# Patient Record
Sex: Female | Born: 1950 | Race: White | Hispanic: No | Smoking: Never smoker
Health system: Southern US, Community
[De-identification: ages and names within clinical notes are randomized; demographics above are authoritative.]

## PROBLEM LIST (undated history)

## (undated) DIAGNOSIS — R51 Headache: Secondary | ICD-10-CM

## (undated) DIAGNOSIS — F32A Depression, unspecified: Secondary | ICD-10-CM

## (undated) DIAGNOSIS — Z8719 Personal history of other diseases of the digestive system: Secondary | ICD-10-CM

## (undated) DIAGNOSIS — H269 Unspecified cataract: Secondary | ICD-10-CM

## (undated) DIAGNOSIS — J189 Pneumonia, unspecified organism: Secondary | ICD-10-CM

## (undated) DIAGNOSIS — D510 Vitamin B12 deficiency anemia due to intrinsic factor deficiency: Secondary | ICD-10-CM

## (undated) DIAGNOSIS — E785 Hyperlipidemia, unspecified: Secondary | ICD-10-CM

## (undated) DIAGNOSIS — F419 Anxiety disorder, unspecified: Secondary | ICD-10-CM

## (undated) DIAGNOSIS — L509 Urticaria, unspecified: Secondary | ICD-10-CM

## (undated) DIAGNOSIS — T7840XA Allergy, unspecified, initial encounter: Secondary | ICD-10-CM

## (undated) DIAGNOSIS — K589 Irritable bowel syndrome without diarrhea: Secondary | ICD-10-CM

## (undated) DIAGNOSIS — G43909 Migraine, unspecified, not intractable, without status migrainosus: Secondary | ICD-10-CM

## (undated) DIAGNOSIS — F329 Major depressive disorder, single episode, unspecified: Secondary | ICD-10-CM

## (undated) DIAGNOSIS — Z91018 Allergy to other foods: Secondary | ICD-10-CM

## (undated) DIAGNOSIS — R519 Headache, unspecified: Secondary | ICD-10-CM

## (undated) DIAGNOSIS — E079 Disorder of thyroid, unspecified: Secondary | ICD-10-CM

## (undated) DIAGNOSIS — K219 Gastro-esophageal reflux disease without esophagitis: Secondary | ICD-10-CM

## (undated) DIAGNOSIS — M1990 Unspecified osteoarthritis, unspecified site: Secondary | ICD-10-CM

## (undated) HISTORY — DX: Allergy, unspecified, initial encounter: T78.40XA

## (undated) HISTORY — DX: Anxiety disorder, unspecified: F41.9

## (undated) HISTORY — DX: Unspecified cataract: H26.9

## (undated) HISTORY — PX: EYE SURGERY: SHX253

## (undated) HISTORY — DX: Urticaria, unspecified: L50.9

## (undated) HISTORY — PX: CARDIAC CATHETERIZATION: SHX172

## (undated) HISTORY — DX: Disorder of thyroid, unspecified: E07.9

## (undated) HISTORY — DX: Allergy to other foods: Z91.018

## (undated) HISTORY — PX: CHOLECYSTECTOMY OPEN: SUR202

## (undated) HISTORY — PX: CATARACT EXTRACTION W/ INTRAOCULAR LENS  IMPLANT, BILATERAL: SHX1307

## (undated) HISTORY — PX: TUBAL LIGATION: SHX77

## (undated) HISTORY — DX: Unspecified osteoarthritis, unspecified site: M19.90

---

## 1979-06-15 DIAGNOSIS — J189 Pneumonia, unspecified organism: Secondary | ICD-10-CM

## 1979-06-15 HISTORY — DX: Pneumonia, unspecified organism: J18.9

## 1983-10-15 HISTORY — PX: ABDOMINAL HYSTERECTOMY: SHX81

## 1999-01-24 ENCOUNTER — Encounter: Payer: Self-pay | Admitting: Family Medicine

## 1999-01-24 ENCOUNTER — Ambulatory Visit (HOSPITAL_COMMUNITY): Admission: RE | Admit: 1999-01-24 | Discharge: 1999-01-24 | Payer: Self-pay | Admitting: *Deleted

## 2001-05-11 ENCOUNTER — Emergency Department (HOSPITAL_COMMUNITY): Admission: EM | Admit: 2001-05-11 | Discharge: 2001-05-12 | Payer: Self-pay

## 2001-06-04 ENCOUNTER — Ambulatory Visit (HOSPITAL_COMMUNITY): Admission: RE | Admit: 2001-06-04 | Discharge: 2001-06-04 | Payer: Self-pay | Admitting: Gastroenterology

## 2003-10-15 DIAGNOSIS — K219 Gastro-esophageal reflux disease without esophagitis: Secondary | ICD-10-CM

## 2003-10-15 HISTORY — DX: Gastro-esophageal reflux disease without esophagitis: K21.9

## 2004-10-11 ENCOUNTER — Ambulatory Visit: Payer: Self-pay | Admitting: Cardiovascular Disease

## 2004-10-11 ENCOUNTER — Inpatient Hospital Stay (HOSPITAL_COMMUNITY): Admission: EM | Admit: 2004-10-11 | Discharge: 2004-10-12 | Payer: Self-pay | Admitting: Emergency Medicine

## 2004-10-26 ENCOUNTER — Emergency Department (HOSPITAL_COMMUNITY): Admission: EM | Admit: 2004-10-26 | Discharge: 2004-10-26 | Payer: Self-pay | Admitting: Emergency Medicine

## 2006-07-14 ENCOUNTER — Ambulatory Visit: Payer: Self-pay | Admitting: Family Medicine

## 2007-01-20 ENCOUNTER — Ambulatory Visit: Payer: Self-pay | Admitting: Family Medicine

## 2007-02-09 ENCOUNTER — Ambulatory Visit: Payer: Self-pay | Admitting: Family Medicine

## 2007-02-12 ENCOUNTER — Ambulatory Visit: Payer: Self-pay | Admitting: Cardiology

## 2007-03-02 ENCOUNTER — Observation Stay (HOSPITAL_COMMUNITY): Admission: EM | Admit: 2007-03-02 | Discharge: 2007-03-04 | Payer: Self-pay | Admitting: Emergency Medicine

## 2007-03-02 ENCOUNTER — Ambulatory Visit: Payer: Self-pay | Admitting: Cardiology

## 2007-03-10 ENCOUNTER — Ambulatory Visit: Payer: Self-pay | Admitting: Family Medicine

## 2007-03-20 ENCOUNTER — Ambulatory Visit: Payer: Self-pay | Admitting: Gastroenterology

## 2008-01-19 ENCOUNTER — Ambulatory Visit: Payer: Self-pay | Admitting: Cardiology

## 2008-02-29 DIAGNOSIS — M129 Arthropathy, unspecified: Secondary | ICD-10-CM | POA: Insufficient documentation

## 2008-02-29 DIAGNOSIS — D649 Anemia, unspecified: Secondary | ICD-10-CM | POA: Insufficient documentation

## 2008-02-29 DIAGNOSIS — F329 Major depressive disorder, single episode, unspecified: Secondary | ICD-10-CM

## 2008-02-29 DIAGNOSIS — E785 Hyperlipidemia, unspecified: Secondary | ICD-10-CM | POA: Insufficient documentation

## 2008-02-29 DIAGNOSIS — F32A Depression, unspecified: Secondary | ICD-10-CM | POA: Insufficient documentation

## 2010-11-23 ENCOUNTER — Encounter (INDEPENDENT_AMBULATORY_CARE_PROVIDER_SITE_OTHER): Payer: Self-pay | Admitting: *Deleted

## 2010-11-29 NOTE — Letter (Signed)
Summary: Pre Visit Letter Revised  [facility]  [address] [place], [place] [address]   Phone: [phone]  Fax: [phone]        11/23/2010 MRN: [id number] Jasmin Butler [address] [place], [place]  [address]             Procedure Date:  01-09-11   Welcome to the Gastroenterology Division at [facility].    You are scheduled to see a nurse for your pre-procedure visit on 12-26-10 at 8:00A.M. on the 3rd floor at [facility], [address].  We ask that you try to arrive at our office 15 minutes prior to your appointment time to allow for check-in.  Please take a minute to review the attached form.  If you answer "Yes" to one or more of the questions on the first page, we ask that you call the person listed at your earliest opportunity.  If you answer "No" to all of the questions, please complete the rest of the form and bring it to your appointment.    Your nurse visit will consist of discussing your medical and surgical history, your immediate family medical history, and your medications.   If you are unable to list all of your medications on the form, please bring the medication bottles to your appointment and we will list them.  We will need to be aware of both prescribed and over the counter drugs.  We will need to know exact dosage information as well.    Please be prepared to read and sign documents such as consent forms, a financial agreement, and acknowledgement forms.  If necessary, and with your consent, a friend or relative is welcome to sit-in on the nurse visit with you.  Please bring your insurance card so that we may make a copy of it.  If your insurance requires a referral to see a specialist, please bring your referral form from your primary care physician.  No co-pay is required for this nurse visit.     If you cannot keep your appointment, please call [phone] to cancel or reschedule prior to your appointment date.  This allows [place]  the opportunity to schedule an appointment for another patient in need of care.    Thank you for choosing [facility] for your medical needs.  We appreciate the opportunity to care for you.  Please visit [place] at our website  to learn more about our practice.  Sincerely, The Gastroenterology Division

## 2010-11-29 NOTE — Letter (Signed)
Summary: Pre Visit Letter Revised  [facility]  [address] [place], [place] [address]   Phone: [phone]  Fax: [phone]        11/23/2010 MRN: [id number] Jasmin Butler [address] [place], [place]  [address]             Procedure Date:  12-25-10   Welcome to the Gastroenterology Division at [facility].    You are scheduled to see a nurse for your pre-procedure visit on 12-05-10 at 1:00P.M. on the 3rd floor at [facility], [address].  We ask that you try to arrive at our office 15 minutes prior to your appointment time to allow for check-in.  Please take a minute to review the attached form.  If you answer "Yes" to one or more of the questions on the first page, we ask that you call the person listed at your earliest opportunity.  If you answer "No" to all of the questions, please complete the rest of the form and bring it to your appointment.    Your nurse visit will consist of discussing your medical and surgical history, your immediate family medical history, and your medications.   If you are unable to list all of your medications on the form, please bring the medication bottles to your appointment and we will list them.  We will need to be aware of both prescribed and over the counter drugs.  We will need to know exact dosage information as well.    Please be prepared to read and sign documents such as consent forms, a financial agreement, and acknowledgement forms.  If necessary, and with your consent, a friend or relative is welcome to sit-in on the nurse visit with you.  Please bring your insurance card so that we may make a copy of it.  If your insurance requires a referral to see a specialist, please bring your referral form from your primary care physician.  No co-pay is required for this nurse visit.     If you cannot keep your appointment, please call [phone] to cancel or reschedule prior to your appointment date.  This allows [place]  the opportunity to schedule an appointment for another patient in need of care.    Thank you for choosing [facility] for your medical needs.  We appreciate the opportunity to care for you.  Please visit [place] at our website  to learn more about our practice.  Sincerely, The Gastroenterology Division

## 2010-12-04 ENCOUNTER — Encounter (INDEPENDENT_AMBULATORY_CARE_PROVIDER_SITE_OTHER): Payer: Self-pay | Admitting: *Deleted

## 2010-12-05 ENCOUNTER — Encounter: Payer: Self-pay | Admitting: Gastroenterology

## 2010-12-11 NOTE — Miscellaneous (Signed)
Summary: [facility] Previsit/prep  Clinical Lists Changes  Medications: Added new medication of MOVIPREP 100 GM  SOLR (PEG-KCL-NACL-NASULF-NA ASC-C) As per prep instructions. - Signed Rx of MOVIPREP 100 GM  SOLR (PEG-KCL-NACL-NASULF-NA ASC-C) As per prep instructions.;  #1 x 0;  Signed;  Entered by: Wyona Almas RN;  Authorized by: Rachael Fee MD;  Method used: Print then Give to Patient Allergies: Added new allergy or adverse reaction of PENICILLIN Observations: Added new observation of NKA: F (12/05/2010 13:16)    Prescriptions: MOVIPREP 100 GM  SOLR (PEG-KCL-NACL-NASULF-NA ASC-C) As per prep instructions.  #1 x 0   Entered by:   Wyona Almas RN   Authorized by:   Rachael Fee MD   Signed by:   Wyona Almas RN on 12/05/2010   Method used:   Print then Give to Patient   RxID:   [id number]

## 2010-12-11 NOTE — Letter (Signed)
Summary: [facility] Instructions  [facility]  [address] [place], [place] [address]   Phone: [phone]  Fax: [phone]       Jasmin Butler    11-23-1950    MRN: [id number]        Procedure Day Dorna Bloom:  [facility]  01/09/11     Arrival Time:  7:30AM     Procedure Time:  8:30AM     Location of Procedure:                    _ X_   [facility] Center (4th Floor)                      PREPARATION FOR COLONOSCOPY WITH MOVIPREP   Starting 5 days prior to your procedure 01/04/11 do not eat nuts, seeds, popcorn, corn, beans, peas,  salads, or any raw vegetables.  Do not take any fiber supplements (e.g. Metamucil, Citrucel, and Benefiber).  THE DAY BEFORE YOUR PROCEDURE         DATE: 01/08/11  DAY: TUESDAY  1.  Drink clear liquids the entire day-NO SOLID FOOD  2.  Do not drink anything colored red or purple.  Avoid juices with pulp.  No orange juice.  3.  Drink at least 64 oz. (8 glasses) of fluid/clear liquids during the day to prevent dehydration and help the prep work efficiently.  CLEAR LIQUIDS INCLUDE: Water Jello Ice Popsicles Tea (sugar ok, no milk/cream) Powdered fruit flavored drinks Coffee (sugar ok, no milk/cream) Gatorade Juice: apple, white grape, white cranberry  Lemonade Clear bullion, consomm, broth Carbonated beverages (any kind) Strained chicken noodle soup Hard Candy                             4.  In the morning, mix first dose of MoviPrep solution:    Empty 1 Pouch A and 1 Pouch B into the disposable container    Add lukewarm drinking water to the top line of the container. Mix to dissolve    Refrigerate (mixed solution should be used within 24 hrs)  5.  Begin drinking the prep at 5:00 p.m. The MoviPrep container is divided by 4 marks.   Every 15 minutes drink the solution down to the next mark (approximately 8 oz) until the full liter is complete.   6.  Follow completed prep with 16 oz of clear liquid of your choice (Nothing  red or purple).  Continue to drink clear liquids until bedtime.  7.  Before going to bed, mix second dose of MoviPrep solution:    Empty 1 Pouch A and 1 Pouch B into the disposable container    Add lukewarm drinking water to the top line of the container. Mix to dissolve    Refrigerate  THE DAY OF YOUR PROCEDURE      DATE: 01/09/11   DAY: WEDNESDAY  Beginning at 3:30AM (5 hours before procedure):         1. Every 15 minutes, drink the solution down to the next mark (approx 8 oz) until the full liter is complete.  2. Follow completed prep with 16 oz. of clear liquid of your choice.    3. You may drink clear liquids until 6:30AM (2 HOURS BEFORE PROCEDURE).   MEDICATION INSTRUCTIONS  Unless otherwise instructed, you should take regular prescription medications with a small sip of water   as early as possible the morning of  your procedure.         OTHER INSTRUCTIONS  You will need a responsible adult at least 62 years of age to accompany you and drive you home.   This person must remain in the waiting room during your procedure.  Wear loose fitting clothing that is easily removed.  Leave jewelry and other valuables at home.  However, you may wish to bring a book to read or  an iPod/MP3 player to listen to music as you wait for your procedure to start.  Remove all body piercing jewelry and leave at home.  Total time from sign-in until discharge is approximately 2-3 hours.  You should go home directly after your procedure and rest.  You can resume normal activities the  day after your procedure.  The day of your procedure you should not:   Drive   Make legal decisions   Operate machinery   Drink alcohol   Return to work  You will receive specific instructions about eating, activities and medications before you leave.    The above instructions have been reviewed and explained to me by   Wyona Almas RN  December 05, 2010 1:37 PM     I fully understand  and can verbalize these instructions _____________________________ Date _________

## 2010-12-25 ENCOUNTER — Other Ambulatory Visit: Payer: Self-pay | Admitting: Gastroenterology

## 2011-01-09 ENCOUNTER — Other Ambulatory Visit: Payer: Self-pay | Admitting: Gastroenterology

## 2011-02-26 NOTE — Discharge Summary (Signed)
Jasmin Butler, Jasmin Butler                  ACCOUNT NO.:  [id number]   MEDICAL RECORD NO.:  [id number]          PATIENT TYPE:  INP   LOCATION:  2012                         FACILITY:  [facility]   PHYSICIAN:  Jesse Sans. Wall, MD, FACCDATE OF BIRTH:  September 14, 1951   DATE OF ADMISSION:  03/02/2007  DATE OF DISCHARGE:  03/04/2007                               DISCHARGE SUMMARY   PROCEDURES:  1. Left heart catheterization.  2. Coronary arteriogram.  3. Left ventriculogram.  4. Star closure device of the right common femoral artery.   PRIMARY DIAGNOSIS:  Chest pain, no coronary artery disease at  catheterization.  D-dimer negative for pulmonary embolism.   SECONDARY DIAGNOSES:  1. Pernicious anemia  2. Hyperlipidemia with a total cholesterol of 270, triglycerides 166,      high-density lipoprotein 42, low-density lipoprotein 195.  3. Status post hysterectomy, cholecystectomy, benign breast      lumpectomy, and benign cervical lymph node removal.  4. Family history of .cardiovascular in her mother.   TIME OF DISCHARGE:  37 minutes.   HOSPITAL COURSE:  The patient is a 60 year old female with no previous  history of coronary artery disease.  She had chest pain in December 2005  and a cath was clean.  She was seen by Dr. Dietrich Pates for recurrent chest  pain and was tried proton pump inhibitor, but her symptoms did not  improve.  On the day of admission she had a prolonged episode of chest  pain lasting over 2 hours and she called our office who told her to come  to the emergency room.  She was admitted for further evaluation.   Her cardiac enzymes were negative for MI.  A Helicobacter antibody test  was negative.  Her troponins were minimally elevated at 0.07.  Because  of ongoing pain and slightly elevated troponin, it was decided that  cardiac catheterization was the best test.  This was performed on  03/03/2007.   The cardiac catheterization showed normal coronaries and normal left  ventricle  with no regional wall motion abnormalities and no AS or MR.   On 03/04/2007, the cath site was stable.  There is concern for a  gastroesophageal reflux disease component to her chest pain so she is  started on a b.i.d. proton pump inhibitor.  She is encouraged to take  Tylenol for pain and follow up with Dr. Lysbeth Galas.  Because her D-dimer was  negative and her cath was clean, there was no further inpatient cardiac  workup indicated.  The patient is considered stable for discharge on  03/04/2007.   DISCHARGE INSTRUCTIONS:  Her activity level is to be increased  gradually.  She can return to work next week.  She is to stick to a low  sodium, heart healthy diet.  She is to call our office for any problems  with the cath site.  She is to follow up with Dr. Dietrich Pates as needed.  She is to follow up with Dr. Lysbeth Galas and call for appointment.   DISCHARGE MEDICATIONS:  1. Nexium 40 mg b.i.d.  2. Tylenol 500 mg 1  or 2 tablets up to e times a day p.r.n.  3. B12 injections every month.  4. BuSpar 5 mg daily p.r.n.  5. CholestOff daily as prior to admission.      Theodore Demark, PA-C      Jesse Sans. Daleen Squibb, MD, [facility]  Electronically Signed    RB/MEDQ  D:  03/04/2007  T:  03/04/2007  Job:  [id number]   cc:   Delaney Meigs, M.D.

## 2011-02-26 NOTE — H&P (Signed)
Jasmin Butler, Jasmin Butler NO.:  [id number]   MEDICAL RECORD NO.:  [id number]          PATIENT TYPE:  INP   LOCATION:  2012                         FACILITY:  [facility]   PHYSICIAN:  Jasmin Sidle, MD DATE OF BIRTH:  Apr 16, 1951   DATE OF ADMISSION:  03/02/2007  DATE OF DISCHARGE:                              HISTORY & PHYSICAL   PRIMARY CARE PHYSICIAN:  Jasmin Butler, M.D.   PRIMARY CARDIOLOGIST:  Jasmin Friends. Dietrich Pates, MD, [facility]   REASON FOR ADMISSION:  Recurrent chest pain.   HISTORY OF PRESENT ILLNESS:  Jasmin Butler is a 60 year old woman with a  history of pernicious anemia and hyperlipidemia.  She has no other major  known cardiac risk factors.  In fact, she had a reassuring cardiac  catheterization back in December 2005 in the setting of chest pain.  She  was recently seen by Dr. Dietrich Butler on May 1 due to recurrent episodes of  chest pain.  It was felt these were fairly atypical in description, and  a trial was given of proton pump inhibitor therapy with plan to followup  to discuss her progress.   Jasmin Butler relates to me the history of a sudden family stress  approximately 2 months ago following which she has had recurrent  episodes of fairly focal left lower anterior chest pain described as a  sharp burning  that can occur, lasting only a few seconds at a time,  but usually in rapid succession.  She seems to relate this mostly to  activity when she is walking around and states that she rarely ever has  these symptoms when she is still or at nighttime.  She also states that  she has been more breathless with activity and generally more fatigued.  It sounds as if she was treated with an anti-inflammatory regimen  initially without improvement in symptoms and more recently a proton  pump inhibitor, again without improvement in symptoms. Today she states  she had a much more prolonged episode lasting approximately 2 hours and  called our office in [place],  being instructed to come in for further  evaluation.   Her electrocardiogram shows sinus rhythm and is interpreted as being  possibly affected by arm lead reversal given a right superior axis  deviation.  There is a repeat EKG with similar changes, however, and  reportedly without any obvious lead reversal.  The previous tracing from  January 2006 showed normal sinus rhythm.  Present tracing shows a single  premature atrial complex, otherwise nonspecific ST changes.  She is not  tachycardic or hypoxic at this time.  She has had no syncope.   ALLERGIES:  PENICILLIN.   PRESENT MEDICATIONS:  Include B12 injections monthly.   PAST MEDICAL HISTORY:  1. Previous hysterectomy.  2. Cholecystectomy.  3. Pernicious anemia.  4. Benign breast lumpectomy.  5. Benign cervical lymph node removal.  6. Reportedly hyperlipidemia.   FAMILY HISTORY:  Significant for stroke in Jasmin Butler at age 35.  The Jasmin Butler had a history of leukemia.  She has three children  in good  health.   SOCIAL HISTORY:  The patient is married.  She works at [employer].  Drinks alcohol occasionally.  Denies any tobacco or other illicit  substance use.   REVIEW OF SYSTEMS:  As in History of Present Illness.  She has had no  obvious cough, hemoptysis, melena, hematochezia, abdominal pain,  appetite change, lower extremity edema, orthopnea, or PND.   PHYSICAL EXAMINATION:  VITAL SIGNS:  The patient is afebrile.  Heart  rate in the 80s in sinus rhythm.  Blood pressure 120/69, oxygen  saturation 96% on room air, respirations 16 and unlabored.  GENERAL:  This is a normally nourished appearing woman in no acute  distress.  HEENT:  Conjunctivae normal.  Oropharynx clear.  NECK: Supple.  No elevated jugular venous pressure, without bruits.  No  thyromegaly noted.  LUNGS:  Clear.  No labored breathing at rest.  CARDIAC:  Regular rate and rhythm without loud murmur or gallop.  ABDOMEN:  Soft, nontender.   Normoactive bowel sounds.  EXTREMITIES:  Exhibit no significant pitting edema.  Distal pulses are  2+.  MUSCULOSKELETAL:  Kyphosis is noted.  NEUROPSYCHIATRIC:  The patient is alert and oriented x3.  Affect is  normal.   LABORATORY DATA:  WBC 7.4, hemoglobin 12.6, hematocrit 37.0, platelets  260.  INR 0.9.  Sodium 140, potassium 4.0, chloride 103, bicarb 31,  glucose 99, BUN 9, creatinine 0.6.  Lipase 29.  Magnesium 2.2.  Alkaline  phosphatase 62.  CK 101, CK-MB 1.0, troponin I 0.07.   Chest x-ray from May 19 demonstrates mild linear left lower lobe  atelectasis with bronchitic changes.   IMPRESSION:  1. Progressive yet fairly atypical chest pain as outlined above.  Also      associated with this is a feeling of relative increased      breathlessness with activity and fatigue.  The Jasmin      electrocardiogram is abnormal which may be affected by incorrect      lead placement, although this is not entirely clear.  Troponin I      level is 0.07 initially with otherwise normal total CK and CK-MB      levels.  2. Previously documented normal coronary arteries at cardiac      catheterization in December 2005 with overall normal left      ventricular systolic function.  Of note, the patient also had a      normal D-dimer level at that time.  3. Possible history of hyperlipidemia.  4. History of pernicious anemia.   PLAN:  I discussed the situation with the patient and her family today.  Symptoms are atypical yet have been progressive despite management  initially with anti-inflammatory drugs and most recently proton pump  inhibitor therapy.  She does relate her symptoms to an episode of  significant stress approximately 2 months ago, and one would wonder  about the possibility of vasospasm or perhaps even Takotsubo  cardiomyopathy, although there is no clear evidence of that at this  point.  We discussed the situation today, and I do think that further testing is indicated.  We  will plan to cycle cardiac markers to look for  any particular trend in troponin I levels.  It may help to guide  additional ischemic testing.  She will be placed on aspirin and Lovenox  for the time being, and we will otherwise  plan to continue proton pump inhibitor therapy.  A followup  electrocardiogram will be obtained in the morning, and we  will also  order an echocardiogram.  We will also order a D-dimer level.  Further  plans to follow pending clinical course and initial followup testing.      Jasmin Sidle, MD  Electronically Signed     SGM/MEDQ  D:  03/02/2007  T:  03/02/2007  Job:  [id number]   cc:   Jasmin Butler, M.D.  Jasmin Friends. Dietrich Pates, MD, [facility]

## 2011-02-26 NOTE — Assessment & Plan Note (Signed)
[facility]                         GASTROENTEROLOGY OFFICE NOTE   NAME:Jasmin Butler, Jasmin Butler                       MRN:          [id number]  DATE:03/20/2007                            DOB:          1951/09/27    REASON FOR REFERRAL:  Dr. Lysbeth Galas asked me to evaluate [name] in  consultation regarding atypical chest pain.   HISTORY OF PRESENT ILLNESS:  Jasmin Butler is as very pleasant 60 year old  woman who has had three months of pain in her left chest.  She describes  the pain as intermittent, stabbing, burning-type pain located in one  specific point of her chest.  She can point just left of her sternum  approximately midway through her chest.  There is about a quarter-sized  area that is painful.  She has had coronary angiogram, was essentially  normal.  She has tried Pepcid for several weeks.  She has Nexium twice  daily.  Neither of these antacid regimens have helped.  The pain is not  brought on by eating.  It is actually improved when she lays down.  The  pain is reproducible when pushing on a specific point in her left chest.  She has no nausea or vomiting.  She has no more classic GERD symptoms of  pyrosis, acid regurgitation.  She has no dysphagia.  She has had no  nausea or vomiting.   REVIEW OF SYSTEMS:  Normal for stable weight.  Mild chronic  constipation, otherwise essentially normal and available on our intake  sheet.   PAST MEDICAL HISTORY:  1. Elevated cholesterol.  2. Arthritis.  3. Pernicious anemia.  4. Cholecystectomy 15 years ago.  5. Hysterectomy.  6. Tubal ligation.  7. Breast surgery.  8. Depression.   CURRENT MEDICATIONS:  1. Nexium 40 mg twice daily.  2. B12 injections monthly.   ALLERGIES:  No known drug allergies.   SOCIAL HISTORY:  Married with four children.  Works as a Designer, jewellery.  Nonsmoker, nondrinker.   FAMILY HISTORY:  No colon cancer or colon polyps in the family.   PHYSICAL EXAMINATION:  VITAL  SIGNS:  Height 5 feet 3 inches, 148 pounds.  Blood pressure 106/62, pulse 84.  CONSTITUTION:  Generally well-appearing.  NEUROLOGIC:  Alert and oriented x3.  EYES:  Extraocular movements are intact.  MOUTH:  Oropharynx moist.  No lesions.  NECK:  Supple.  No lymphadenopathy.  CARDIOVASCULAR:  Heart has a regular rate and rhythm.  CHEST:  A focal point of tenderness in her left chest that is  reproducible.  LUNGS:  Clear to auscultation bilaterally.  ABDOMEN:  Soft, nontender, nondistended.  Normal bowel sounds.  EXTREMITIES:  No lower extremity edema.  SKIN:  No rash or lesions on visible extremities.   ASSESSMENT/PLAN:  A 60 year old woman with atypical chest pain, unlikely  related to gastrointestinal tract.  This pain is focal pain, just left  of her sternum.  The pain is reproducible with palpation.  It seems much  more likely this is costochondritis.  She has no classic  gastroesophageal reflux disease symptoms.  She also says the pain  has  not been improved after a month of twice daily Nexium and also Pepcid  trial.  The pain is better when lying down, usually gastroesophageal  reflux disease symptoms are worse when lying down.  I think it is highly  unlikely this is related to her gastrointestinal tract.  It seems more  plausible this is costochondritis.  Given the lack of other upper  gastrointestinal symptoms, I do not think we need to proceed with EGD at  this point.  I think the working diagnosis of costochondritis is  reasonable.  I recommended she stop taking proton pump inhibitors, as  they have not made any difference.     Rachael Fee, MD  Electronically Signed    DPJ/MedQ  DD: 03/20/2007  DT: 03/20/2007  Job #: [id number]   cc:   Delaney Meigs, M.D.

## 2011-02-26 NOTE — Cardiovascular Report (Signed)
NAMELUKA, REISCH NO.:  [id number]   MEDICAL RECORD NO.:  [id number]          PATIENT TYPE:  INP   LOCATION:  2012                         FACILITY:  [facility]   PHYSICIAN:  Salvadore Farber, MD  DATE OF BIRTH:  06-Feb-1951   DATE OF PROCEDURE:  03/03/2007  DATE OF DISCHARGE:                            CARDIAC CATHETERIZATION   PROCEDURE:  Left heart catheterization, left ventriculography, coronary  angiography, StarClose closure of the right common femoral arteriotomy  site.   INDICATIONS:  Ms. Wojdyla is a 60 year old woman with hypercholesterolemia  who had a normal cardiac catheterization in 2005.  She was seen on May 1  by Dr. Dietrich Pates in the office for episodes of chest pain.  A trial of  proton pump inhibitor was undertaken.  She has had no improvement in her  symptoms with this or a trial of nonsteroidal anti-inflammatories.  She  had a prolonged episode yesterday prompting admission to the hospital.  She ruled out for myocardial infarction by serial enzymes,  electrocardiograms.  Dr. Diona Browner requested cardiac catheterization for  definitive exclusion of myocardial ischemia as a contributor to her  chest pain syndrome.  She arrives in the lab with ongoing 4/10  substernal chest pain with normal electrocardiogram and enzymes.   PROCEDURAL TECHNIQUE:  Informed consent was obtained.  Under 1%  lidocaine local anesthesia, a 5-French sheath was placed in the right  common femoral artery using the modified Seldinger technique.  Diagnostic angiography and ventriculography were performed using JL4,  JR4, and pigtail catheters.  The arteriotomy was then closed using a  StarClose device.  Complete hemostasis was obtained.  The patient was  then transferred to the holding room in stable condition having  tolerated the procedure well.   COMPLICATIONS:  None.   FINDINGS:  1. LV:  102/0/4.  EF 65% without regional wall motion abnormality.  2. No aortic stenosis  or mitral regurgitation.  3. Left main:  Angiographically normal.  4. LAD:  Moderate-sized vessel giving rise to a single diagonal.  It      is angiographically normal.  5. Circumflex:  Large, dominant vessel giving rise to a large obtuse      marginal, posterior left ventricular branch and the PDA.  It is      angiographically normal.  6. RCA:  Small nondominant vessel.  It is angiographically normal.   IMPRESSION/PLAN:  The patient has angiographically normal coronary  arteries with normal left ventricular size and systolic function.  There  is no aortic stenosis.  The etiology of her chest pain thus remains  unclear.  Symptoms  would be extremely atypical for aortic dissection.  There has been no  response to proton pump inhibitor or nonsteroidal.  The patient's has  voiced concern that her symptoms may be due to stress.  We will continue  to observe her.      Salvadore Farber, MD  Electronically Signed     WED/MEDQ  D:  03/03/2007  T:  03/03/2007  Job:  [id number]   cc:   Delaney Meigs, M.D.  Molly Maduro  Rosebud Poles, MD, [facility]

## 2011-02-26 NOTE — Letter (Signed)
January 19, 2008     RE:  Jasmin Butler, Jasmin Butler  MRN:  [id number]  /  DOB:  1951-10-08   REFERRING PHYSICIAN:  Dr. Joette Catching.   Dear Marolyn Haller:   Jasmin Butler returns to the office somewhat belatedly for further  assessment of chest discomfort.  I first saw her in May of 2008 and  treated her empirically with Prevacid.  She subsequently had a recurrent  episode, was seen in the emergency department and transferred to [facility], where she underwent a second normal cardiac  catheterization.  She subsequently was seen by one of the [place]  gastroenterologists, who did not feel that her symptoms were consistent  with GI disease.  She has had persistent localized left sternal border  pain since that time.  This is present virtually constantly.  It is  relieved by direct pressure.  There is no radiation.  There are no  associated symptoms.  She feels that it is significantly impairing her  quality of life.  Nonetheless, she did not consult me for this problem.  She noted an episode of severe left upper quadrant/left lower chest pain  this past Saturday that began while she was at rest.  The quality was  sharp and stabbing and extremely severe, but the duration was momentary.  She had 3 separate spells, each lasting a matter of seconds, and then  experienced diaphoresis and an increase in blood pressure.  She was  concerned that this may have been a myocardial infarction.   Interim history was updated.  There have been no notable changes.  Her  only medication is monthly B12 injections for a history of pernicious  anemia.   On exam, a pleasant woman in no acute distress.  The weight is 147, stable.  Blood pressure 120/80, heart rate 62 and  regular, respirations 18.  NECK:  No jugular venous distention; no carotid bruits.  LUNGS:  Clear.  CARDIAC:  Normal first and second heart sounds.  A modest systolic  ejection murmur.  ABDOMEN:  Soft and nontender; normal bowel sounds; no  organomegaly.  EXTREMITIES:  Distal pulses intact; no edema.   EKG:  Normal sinus rhythm; voltage criteria for LVH; otherwise normal.  No change when compared to a previous tracing of Feb 12, 2007.   IMPRESSION:  Jasmin Butler has persistent chest wall pain that could  represent a musculoskeletal problem or a chronic neuritis.  I doubt that  further diagnostic testing would be helpful.  Medical therapy likely to  be of benefit includes Neurontin and tricyclic antidepressants.  We will  start with the former at a dose of 400 mg t.i.d..  The chest discomfort  that she suffered this past week was almost certainly noncardiac,  although coronary spasm would be a possible etiology.  A  gastrointestinal problem such as esophageal spasm or irritable bowel  syndrome (she does have chronic constipation) would be more likely.  Unless this becomes a recurrent or chronic problem, I see no reason for  diagnostic testing or intervention at this time.  I will reassess this  nice woman in 2 months.    Sincerely,      Gerrit Friends. Dietrich Pates, MD, [facility]  Electronically Signed    RMR/MedQ  DD: 01/19/2008  DT: 01/19/2008  Job #: [id number]

## 2011-03-01 NOTE — Letter (Signed)
Feb 12, 2007    Dr. Joette Catching  [address]  [place], [place]  [address]   RE:  Jasmin, Butler  MRN:  [id number]  /  DOB:  11/27/1950   Dear Marolyn Haller,   It was my pleasure evaluating Jasmin Butler in the office today in  consultation at your request for chest discomfort. As you know, this  nice woman was cared for by our group in December of 2005 when she was  admitted to [facility] for a different variety of chest  discomfort. She had sharp stabbing pain at that time that was improved  with nitroglycerine. She underwent cardiac catheterization that was  entirely normal. She had a normal D-Dimer level and was advised to  follow up in your office after discharge. She apparently did well until  the past month or 2 when she developed recurrent chest discomfort. This  is a burning pain that is fairly localized in the left parasternal  region. There is no chest wall tenderness. There is some exacerbation  with inspiration and relief with assuming the supine position. She is  able to work or carry on with her activities, but still classifies the  pain as moderate-to-severe. There are no associated symptoms. There is  no relationship to exertion. There is no relationship to meals nor time  of day. She has multiple episodes over the course of most days, each  lasting a number of minutes. She did experience an episode of  psychologic stress related to family problems at the onset of these  symptoms, but that has resolved, but the pain has not.   Otherwise, the patient has been quite healthy. She has a history of  pernicious anemia for which she is treated with B12 injections.  Otherwise, she takes no medicine chronically nor does she have any  ongoing medical problems. Specifically, she has not had hypertension,  hyperlipidemia, nor diabetes. She does not smoke cigarettes. She  recently was treated with a course of meloxicam and then Celebrex for  her chest discomfort.  Neither showed any efficacy.   SOCIAL HISTORY:  Works at [employer]; married with 3 children. No excessive  use of alcohol.   FAMILY HISTORY:  Positive for leukemia, hypertension, stroke, and  asthma.   REVIEW OF SYSTEMS:  Notable for the need for corrective lenses,  occasional dyspnea, and stable weight, and appetite. All other systems  reviewed and are negative.   On exam, proportionate woman in no acute distress. The weight is 147,  blood pressure 105/70, heart rate 75 and regular, respirations 16.  HEENT: Anicteric scleral; normal lids and conjunctivae; normal oral  mucosa.  NECK: No jugular venous distension; normal carotid upstrokes without  bruits.  SKIN: No significant lesions.  PSYCHIATRIC: Alert and oriented; normal affect.  CARDIAC: Normal first and second heart sounds; minimal systolic ejection  murmur; normal PMI.  LUNGS: Clear.  ABDOMEN: Soft and nontender; no masses; no organomegaly.  EXTREMITIES: Normal distal pulses; no edema.  NEURO/MUSCULAR: Symmetric strength and tone; normal cranial nerves.   EKG: Normal sinus rhythm with occasional PVC; slightly delayed R wave  progression; minor nonspecific T wave abnormality.   IMPRESSION:  Jasmin Butler presents today with atypical chest discomfort  that does not respond to antiinflammatory agents. In light of a normal  catheterization less than 3 years ago, the likelihood of coronary  disease is extremely low. I do not think stress testing would be the  place to start. I have given her a  prescription for Prevacid 30 mg  b.i.d. The pain has some characteristics of reflux, but is not entirely  consistent. I think this is a reasonable first approach to managing her  symptoms. I will see her back in 3 week and proceed with additional  evaluation and treatment if symptoms have not resolved. Thanks so much  for sending this nice young woman to see me.    Sincerely,      Gerrit Friends. Dietrich Pates, MD, [facility]  Electronically  Signed    RMR/MedQ  DD: 02/12/2007  DT: 02/12/2007  Job #: [id number]

## 2011-03-01 NOTE — Discharge Summary (Signed)
NAMEAMANDALEE, LACAP                  ACCOUNT NO.:  [id number]   MEDICAL RECORD NO.:  [id number]          PATIENT TYPE:  INP   LOCATION:  3706                         FACILITY:  [facility]   PHYSICIAN:  Pricilla Riffle, M.D.    DATE OF BIRTH:  Sep 27, 1951   DATE OF ADMISSION:  10/11/2004  DATE OF DISCHARGE:  10/12/2004                                 DISCHARGE SUMMARY   PROCEDURES:  1.  Cardiac catheterization.  2.  Coronary arteriogram.  3.  Left ventriculogram.   HISTORY OF PRESENT ILLNESS:  Ms. Lanagan is a 60 year old female with no known  history of coronary artery disease.  She has a history of pernicious anemia  and hyperlipidemia.  She was waken by a sharp stabbing chest pain two days  prior to admission which has not resolved.  She also complains of some chest  tightness.  She was helped by sublingual nitroglycerin.  She came to the  emergency room for evaluation by [facility] and was admitted for  further evaluation and treatment.   HOSPITAL COURSE:  Her cardiac enzymes were negative for MI, but her symptoms  were concerning and it was felt that she needed a cardiac catheterization to  further define her anatomy.  The cardiac catheterization was performed on  October 12, 2004.   The cardiac catheterization showed normal coronary arteries.  Her EF was  normal as well.  Dr. Samule Ohm felt that the pain was noncardiac and ordered a  D-dimer to further evaluate her.   Dr. Tenny Craw assessed Ms. Alona Bene later on October 12, 2004.  The D-dimer was  negative at 0.43 and her groin was without ecchymosis or hematoma.  She was  placed on Protonix empirically and is to take it b.i.d. for a week and then  drop back to daily.  She has a followup appointment with Paulita Cradle,  N.P., at [facility].  Ms. Slatten is considered stable  for discharge on October 12, 2004, with outpatient followup arranged.   LABORATORY VALUES:  Hemoglobin 11.3, hematocrit 34.3, WBC 6.3,  platelets  220.  Sodium 138, potassium 3.7, chloride 104, CO2 30, BUN 13, creatinine  0.7, glucose 109.  Other CMET values within normal limits, except for total  bilirubin slight low at 0.2.  D-dimer of 0.43.  BNP 41.3.   Chest x-ray:  Heart size and vascularity are normal.  There is some minimal  atelectasis at the left lung base.   DISCHARGE CONDITION:  Stable.   DISCHARGE DIAGNOSES:  1.  Chest pain.  No evidence for pulmonary embolus with a negative D-dimer      and cardiac catheterization negative for coronary artery disease.      Empiric Protonix.  Follow up with primary M.D.  2.  Hyperlipidemia.  3.  Pernicious anemia.  4.  Allergy/intolerance to PENICILLIN.  5.  Status post lumpectomy, lymph node removal, cholecystectomy and      hysterectomy.   DISCHARGE INSTRUCTIONS:  1.  Her activity level is to include no driving and no lifting over 5 pounds  for two days.  2.  She is to stick to a low-fat diet.  3.  She is to call the office for problems with the catheterization site.  4.  She is to follow up with [facility] on October 20, 2003, at 9:30 a.m. and with Dr. Tenny Craw on a p.r.n. basis.   DISCHARGE MEDICATIONS:  1.  Vitamins as prior to admission and B12 as prior to admission.  2.  Protonix 40 mg one p.o. b.i.d. x 1 week and then one p.o. daily.       RB/MEDQ  D:  10/12/2004  T:  10/12/2004  Job:  [id number]   cc:   Paulita Cradle, N.P.  [facility]

## 2011-03-01 NOTE — Cardiovascular Report (Signed)
NAMEHANNY, ELSBERRY                  ACCOUNT NO.:  [id number]   MEDICAL RECORD NO.:  [id number]          PATIENT TYPE:  INP   LOCATION:  3706                         FACILITY:  [facility]   PHYSICIAN:  Carole Binning, M.D. LHCDATE OF BIRTH:  06/27/1951   DATE OF PROCEDURE:  10/12/2004  DATE OF DISCHARGE:                              CARDIAC CATHETERIZATION   PROCEDURE PERFORMED:  Left heart catheterization with coronary angiography  and left ventriculography.   INDICATIONS:  Ms. Goehring is a 60 year old woman with cardiac risk factors.  She was admitted to the hospital with chest pain that was worrisome for  unstable angina.   PROCEDURAL NOTE:  A 6 French sheath was placed in the right femoral artery.  Coronary angiography was performed with standard Judkins 6 French catheters.  Left ventriculography was performed with an angle pigtail catheter.  Contrast was Omnipaque.  There were no complications.   RESULTS:  Hemodynamics:  1.  Left ventricular pressure 102/9.  2.  Aortic pressure 102/64.  There is no aortic valve gradient.   Left Ventriculogram:  Wall motion is normal. Ejection fraction estimated at 60%.  There is trace  mitral regurgitation.   Coronary Ateriography (Left Dominant):  1.  The left main is normal.  2.  The left anterior descending artery gives rise to a large first diagonal      and a small second diagonal branch.  The LAD is normal.  3.  The left circumflex gives rise to a large obtuse marginal branch, a      small first and second posterior lateral branches, normal sized third      posterior lateral branch and a normal sized posterior descending artery.      The left circumflex is normal.  4.  The right coronary artery is a small nondominant vessel.  The right      coronary artery is normal.   IMPRESSION:  1.  Normal left ventricular systolic function.  2.  Normal coronary arteries.   PLAN:  Alternative etiologies for the patient's chest pain will be  investigated.      Mark   MWP/MEDQ  D:  10/12/2004  T:  10/12/2004  Job:  [id number]   cc:   Pricilla Riffle, M.D.   Cardiac Cath Lab   Birdena Jubilee, NP

## 2011-03-01 NOTE — H&P (Signed)
NAMEDICIE, EDELEN NO.:  [id number]   MEDICAL RECORD NO.:  [id number]          PATIENT TYPE:  INP   LOCATION:  1827                         FACILITY:  [facility]   PHYSICIAN:  Pricilla Riffle, M.D.    DATE OF BIRTH:  [birth year]   DATE OF ADMISSION:  10/11/2004  DATE OF DISCHARGE:                                HISTORY & PHYSICAL   IDENTIFICATION:  The patient is a 60 year old who was referred for  evaluation of chest pain.   HISTORY OF PRESENT ILLNESS:  The patient has no known coronary artery  disease. She notes intermittent chest pain over the past couple days, 2 days  ago it became a sharp pain that was a stabbing sensation intermittently,  like with her heart beat. Not positional, not pleuritic. Yesterday she had  continuous dull sensation. This morning she woke up actually with chest  pressure and diaphoresis.  She usually does not have this and is actually  cold by nature.  Also accompanied today by right jaw pain.   The patient also notes some increased dyspnea on exertion over the past 6  months, tires easily with activity. Denies nausea. No syncope, no orthopnea,  no paroxysmal nocturnal dyspnea.  Nitroglycerin earlier today helped with  sensation. Currently pain free.  Note, she did note a brash sensation to her  taste, only today.   ALLERGIES:  PENICILLIN.   MEDICATIONS:  Vitamin B12 each month and a multivitamin daily.   PAST MEDICAL HISTORY:  1.  Pernicious anemia.  2.  Dyslipidemia, triglyceride 221, HDL 51, LDL 137, total cholesterol 232.   SOCIAL HISTORY:  The patient lives in [place]. She has 3 children. Does not  smoke, does not drink.   FAMILY HISTORY:  Mother is alive, had a CVA in her 80's. Father died of  leukemia in his 53's. One sister with possible heart problems, underwent  catheterization, no angioplasty by her recollection.   REVIEW OF SYSTEMS:  Sweats this a.m.  CARDIOPULMONARY: As noted. Notes  occasional palpitations.  Question GE reflux. Otherwise all systems reviewed  and negative to the above problem except as noted.   PHYSICAL EXAMINATION:  GENERAL:  The patient is currently in no acute  distress.  VITAL SIGNS:  Blood pressure 99/63, pulse is 78 and regular, temperature is  98.7.  HEENT:  Normocephalic, atraumatic. PERRL.  NECK:  No JVD, no bruits.  LUNGS:  Relatively clear.  CARDIAC EXAM:  Regular rate and rhythm S1, S2, no S3, S4 or murmurs.  ABDOMEN:  Exam is benign.  EXTREMITIES:  Good distal pulses, no edema.   A 12-lead EKG shows normal sinus rhythm at a rate of 70 beats per minute.   Labs are pending. Initial point of care marker negative.   IMPRESSION:  The patient is a 60 year old with mild dyslipidemia with  increased dyspnea over the past several months, also with chest pains that  are somewhat atypical but concerning. Today she has had chest pressure  accompanied by diaphoresis.  With this progression, I have discussed with  the patient that she needs  evaluation. Discussed pros and cons of  noninvasive versus invasive evaluation.  She would want to know definitively  and understands risks and agrees to proceed with cardiac catheterization.  Will go ahead and treat with aspirin, heparin, nitroglycerin, beta blocker  and also empiric Protonix. Plan for catheterization in the morning.       PVR/MEDQ  D:  10/11/2004  T:  10/11/2004  Job:  [id number]

## 2011-03-01 NOTE — Procedures (Signed)
[place]  Patient:    Jasmin Butler, Jasmin Butler [facility] Visit Number: [id number] MRN: [id number]          Service Type: Attending:  Verlin Grills, M.D. Proc. Date: 06/04/01   CC:         Elvina Sidle, M.D., [facility]   Procedure Report  REFERRING PHYSICIAN:  Elvina Sidle, M.D.  PROCEDURE:  Surveillance colonoscopy.  PROCEDURE INDICATION:  [name] (date of birth [birth year]) is a 60 year old female with unexplained right lower quadrant abdominal pain and constipation.  She is due for her first surveillance colonoscopy with polypectomy to prevent colon cancer.  Ms. Barrick viewed our colonoscopy education film in my office, and I discussed with her the complications associated with colonoscopy and polypectomy, including a 15 per thousand risk of bleeding and four per thousand risk of colon perforation requiring surgery.  Ms. Koska has signed the operative permit.  MEDICATION ALLERGIES:  PENICILLIN.  CHRONIC MEDICATIONS:  Premarin, hyoscyamine, stool softener, baby aspirin, multivitamin, vitamin E, vitamin B12 injections monthly.  PAST MEDICAL HISTORY:  Pernicious anemia, total abdominal hysterectomy with BSO, cholecystectomy.  ENDOSCOPIST:  Verlin Grills, M.D.  PREMEDICATION:  Versed 7.5 mg, fentanyl 50 mcg.  ENDOSCOPE:  Olympus pediatric colonoscope.  DESCRIPTION OF PROCEDURE:  After obtaining informed consent, Ms. Brissett was placed in the left lateral decubitus position.  I administered intravenous fentanyl and intravenous Versed to achieve conscious sedation for the procedure.  The patients blood pressure, oxygen saturation, and cardiac rhythm were monitored throughout the procedure and documented in the medical record.  Anal inspection was normal.  Digital rectal exam was normal.  The Olympus pediatric video colonoscope was introduced into the rectum and easily advanced to the cecum.  A  normal-appearing ileocecal valve was intubated and the distal ileum inspected.  Colonic preparation for the exam today was excellent.  Rectum normal.  Sigmoid colon and descending colon normal.  Splenic flexure normal.  Transverse colon normal.  Hepatic flexure normal.  Ascending colon normal.  Cecum and ileocecal valve normal.  Distal ileum normal.  ASSESSMENT:  Normal screening proctocolonoscopy to the cecum with intubation of the ileocecal valve and distal ileal inspection.  No endoscopic evidence for the presence of colorectal neoplasia or inflammatory bowel disease. Attending:  Verlin Grills, M.D. DD:  06/04/01 TD:  06/05/01 Job: [id number]

## 2011-06-12 ENCOUNTER — Encounter (INDEPENDENT_AMBULATORY_CARE_PROVIDER_SITE_OTHER): Payer: BC Managed Care – PPO | Admitting: Ophthalmology

## 2011-06-12 DIAGNOSIS — H33309 Unspecified retinal break, unspecified eye: Secondary | ICD-10-CM

## 2011-06-12 DIAGNOSIS — H43819 Vitreous degeneration, unspecified eye: Secondary | ICD-10-CM

## 2011-06-19 ENCOUNTER — Encounter (INDEPENDENT_AMBULATORY_CARE_PROVIDER_SITE_OTHER): Payer: BC Managed Care – PPO | Admitting: Ophthalmology

## 2011-06-19 DIAGNOSIS — H35359 Cystoid macular degeneration, unspecified eye: Secondary | ICD-10-CM

## 2011-06-19 DIAGNOSIS — H33309 Unspecified retinal break, unspecified eye: Secondary | ICD-10-CM

## 2012-06-18 ENCOUNTER — Encounter (INDEPENDENT_AMBULATORY_CARE_PROVIDER_SITE_OTHER): Payer: BC Managed Care – PPO | Admitting: Ophthalmology

## 2013-10-13 ENCOUNTER — Encounter: Payer: Self-pay | Admitting: Gastroenterology

## 2013-11-09 ENCOUNTER — Ambulatory Visit: Payer: BC Managed Care – PPO | Admitting: Gastroenterology

## 2014-09-29 ENCOUNTER — Observation Stay (HOSPITAL_COMMUNITY)
Admission: EM | Admit: 2014-09-29 | Discharge: 2014-09-30 | Disposition: A | Discharge status: Home / Self Care | Payer: Medicare Other | Attending: Cardiology | Admitting: Cardiology

## 2014-09-29 ENCOUNTER — Emergency Department (HOSPITAL_COMMUNITY): Payer: Medicare Other

## 2014-09-29 ENCOUNTER — Encounter (HOSPITAL_COMMUNITY): Payer: Self-pay | Admitting: Emergency Medicine

## 2014-09-29 DIAGNOSIS — R0602 Shortness of breath: Secondary | ICD-10-CM

## 2014-09-29 DIAGNOSIS — Z880 Allergy status to penicillin: Secondary | ICD-10-CM | POA: Insufficient documentation

## 2014-09-29 DIAGNOSIS — K219 Gastro-esophageal reflux disease without esophagitis: Secondary | ICD-10-CM | POA: Diagnosis not present

## 2014-09-29 DIAGNOSIS — E785 Hyperlipidemia, unspecified: Secondary | ICD-10-CM | POA: Diagnosis present

## 2014-09-29 DIAGNOSIS — K589 Irritable bowel syndrome without diarrhea: Secondary | ICD-10-CM | POA: Diagnosis not present

## 2014-09-29 DIAGNOSIS — R079 Chest pain, unspecified: Secondary | ICD-10-CM | POA: Diagnosis present

## 2014-09-29 DIAGNOSIS — I200 Unstable angina: Secondary | ICD-10-CM

## 2014-09-29 DIAGNOSIS — R072 Precordial pain: Secondary | ICD-10-CM

## 2014-09-29 HISTORY — DX: Personal history of other diseases of the digestive system: Z87.19

## 2014-09-29 HISTORY — DX: Pneumonia, unspecified organism: J18.9

## 2014-09-29 HISTORY — DX: Migraine, unspecified, not intractable, without status migrainosus: G43.909

## 2014-09-29 HISTORY — DX: Headache: R51

## 2014-09-29 HISTORY — DX: Depression, unspecified: F32.A

## 2014-09-29 HISTORY — DX: Irritable bowel syndrome, unspecified: K58.9

## 2014-09-29 HISTORY — DX: Hyperlipidemia, unspecified: E78.5

## 2014-09-29 HISTORY — DX: Major depressive disorder, single episode, unspecified: F32.9

## 2014-09-29 HISTORY — DX: Headache, unspecified: R51.9

## 2014-09-29 HISTORY — DX: Gastro-esophageal reflux disease without esophagitis: K21.9

## 2014-09-29 HISTORY — DX: Vitamin B12 deficiency anemia due to intrinsic factor deficiency: D51.0

## 2014-09-29 LAB — HEPARIN LEVEL (UNFRACTIONATED)
Heparin Unfractionated: 0.33 IU/mL (ref 0.30–0.70)
Heparin Unfractionated: 0.3 IU/mL (ref 0.30–0.70)

## 2014-09-29 LAB — BASIC METABOLIC PANEL
Anion gap: 15 (ref 5–15)
BUN: 9 mg/dL (ref 6–23)
CALCIUM: 9.8 mg/dL (ref 8.4–10.5)
CO2: 24 mEq/L (ref 19–32)
CREATININE: 0.73 mg/dL (ref 0.50–1.10)
Chloride: 98 mEq/L (ref 96–112)
GFR, EST NON AFRICAN AMERICAN: 88 mL/min — AB (ref >90)
Glucose, Bld: 144 mg/dL — ABNORMAL HIGH (ref 70–99)
Potassium: 4.1 mEq/L (ref 3.7–5.3)
Sodium: 137 mEq/L (ref 137–147)

## 2014-09-29 LAB — CBC
HCT: 38.7 % (ref 36.0–46.0)
Hemoglobin: 12.7 g/dL (ref 12.0–15.0)
MCH: 27.4 pg (ref 26.0–34.0)
MCHC: 32.8 g/dL (ref 30.0–36.0)
MCV: 83.6 fL (ref 78.0–100.0)
Platelets: 259 10*3/uL (ref 150–400)
RBC: 4.63 MIL/uL (ref 3.87–5.11)
RDW: 13.8 % (ref 11.5–15.5)
WBC: 13.7 10*3/uL — ABNORMAL HIGH (ref 4.0–10.5)

## 2014-09-29 LAB — TROPONIN I
Troponin I: <0.3 ng/mL (ref <0.30)
Troponin I: <0.3 ng/mL (ref <0.30)

## 2014-09-29 LAB — I-STAT TROPONIN, ED: Troponin i, poc: 0 ng/mL (ref 0.00–0.08)

## 2014-09-29 LAB — D-DIMER, QUANTITATIVE (NOT AT ARMC): D DIMER QUANT: 0.52 ug{FEU}/mL — AB (ref 0.00–0.48)

## 2014-09-29 LAB — PRO B NATRIURETIC PEPTIDE: Pro B Natriuretic peptide (BNP): 73 pg/mL (ref 0–125)

## 2014-09-29 MED ORDER — NITROGLYCERIN 2 % TD OINT
1.0000 [in_us] | TOPICAL_OINTMENT | Freq: Once | TRANSDERMAL | Status: AC
  Administered 2014-09-29: 1 [in_us] via TOPICAL
  Filled 2014-09-29: qty 1

## 2014-09-29 MED ORDER — ACETAMINOPHEN 325 MG PO TABS
650.0000 mg | ORAL_TABLET | ORAL | Status: DC | PRN
  Administered 2014-09-29: 650 mg via ORAL
  Filled 2014-09-29: qty 2

## 2014-09-29 MED ORDER — ASPIRIN 81 MG PO CHEW: 324.0000 mg | CHEWABLE_TABLET | Freq: Once | ORAL | Status: DC

## 2014-09-29 MED ORDER — NITROGLYCERIN IN D5W 200-5 MCG/ML-% IV SOLN
5.0000 ug/min | INTRAVENOUS | Status: DC
  Administered 2014-09-29: 5 ug/min via INTRAVENOUS
  Filled 2014-09-29: qty 250

## 2014-09-29 MED ORDER — HEPARIN (PORCINE) IN NACL 100-0.45 UNIT/ML-% IJ SOLN
950.0000 [IU]/h | INTRAMUSCULAR | Status: DC
  Administered 2014-09-30: 950 [IU]/h via INTRAVENOUS
  Filled 2014-09-29 (×3): qty 250

## 2014-09-29 MED ORDER — ASPIRIN 81 MG PO CHEW
162.0000 mg | CHEWABLE_TABLET | Freq: Once | ORAL | Status: AC
  Administered 2014-09-29: 162 mg via ORAL
  Filled 2014-09-29: qty 2

## 2014-09-29 MED ORDER — HEPARIN BOLUS VIA INFUSION
3000.0000 [IU] | Freq: Once | INTRAVENOUS | Status: AC
  Administered 2014-09-29: 3000 [IU] via INTRAVENOUS
  Filled 2014-09-29: qty 3000

## 2014-09-29 MED ORDER — METOPROLOL TARTRATE 1 MG/ML IV SOLN
2.5000 mg | INTRAVENOUS | Status: DC | PRN
  Filled 2014-09-29: qty 5

## 2014-09-29 MED ORDER — SODIUM CHLORIDE 0.9 % IV SOLN
INTRAVENOUS | Status: DC
  Administered 2014-09-29: 14:00:00 via INTRAVENOUS

## 2014-09-29 MED ORDER — ONDANSETRON HCL 4 MG/2ML IJ SOLN: 4.0000 mg | Freq: Four times a day (QID) | INTRAMUSCULAR | Status: DC | PRN

## 2014-09-29 MED ORDER — NITROGLYCERIN 0.4 MG SL SUBL
0.4000 mg | SUBLINGUAL_TABLET | SUBLINGUAL | Status: DC | PRN
  Administered 2014-09-29 – 2014-09-30 (×5): 0.4 mg via SUBLINGUAL
  Filled 2014-09-29 (×3): qty 1

## 2014-09-29 NOTE — ED Provider Notes (Addendum)
CSN: [id number]     Arrival date & time 09/29/14  0111 History   First MD Initiated Contact with Patient 09/29/14 0123     Chief Complaint  Patient presents with  . Chest Pain     (Consider location/radiation/quality/duration/timing/severity/associated sxs/prior Treatment) HPI Comments: Pt comes in with cc of chest pain. Pt has hx of anemia. There is no cardiac hx, or chest pain history. Pt reports that she started having chest pain about 2 hours ago, as it woke her up from her sleep. Pain is located in the midsternal region and is radiating to her back and is constant. There is no specific aggravating or relieving factors, pt has mild nausea and also some sense of dyspnea. There is no cough. No hx of PE, DVT. Pt is not a smoker. She has no personal or fam hx of musculoskeletal problems and she has no fam hx of CAD. Pt denies any neurologic complains.  Patient is a 63 y.o. female presenting with chest pain. The history is provided by the patient.  Chest Pain Associated symptoms: nausea and shortness of breath   Associated symptoms: no abdominal pain, no cough and not vomiting     Past Medical History  Diagnosis Date  . Pernicious anemia   . IBS (irritable bowel syndrome)    Past Surgical History  Procedure Laterality Date  . Abdominal hysterectomy    . Cholecystectomy     No family history on file. History  Substance Use Topics  . Smoking status: Never Smoker   . Smokeless tobacco: Not on file  . Alcohol Use: No   OB History    No data available     Review of Systems  Constitutional: Negative for activity change.  HENT: Negative for facial swelling.   Respiratory: Positive for shortness of breath. Negative for cough and wheezing.   Cardiovascular: Positive for chest pain.  Gastrointestinal: Positive for nausea. Negative for vomiting, abdominal pain, diarrhea, constipation, blood in stool and abdominal distention.  Genitourinary: Negative for hematuria and difficulty  urinating.  Musculoskeletal: Negative for neck pain.  Skin: Negative for color change.  Neurological: Negative for speech difficulty.  Hematological: Does not bruise/bleed easily.  Psychiatric/Behavioral: Negative for confusion.      Allergies  Other; Statins; and Penicillins  Home Medications   Prior to Admission medications   Medication Sig Start Date End Date Taking? Authorizing Provider  B Complex Vitamins (B-COMPLEX/B-12 PO) Take 1 tablet by mouth daily.   Yes Historical Provider, MD  Calcium Polycarbophil (FIBERCON PO) Take 3 tablets by mouth daily.   Yes Historical Provider, MD  cyanocobalamin (,VITAMIN B-12,) 1000 MCG/ML injection Inject 1,000 mcg into the muscle every 30 (thirty) days. 07/04/14  Yes Historical Provider, MD  docusate sodium (COLACE) 100 MG capsule Take 300 mg by mouth daily.   Yes Historical Provider, MD  NIACIN PO Take 1 tablet by mouth daily.   Yes Historical Provider, MD  polyvinyl alcohol (LIQUIFILM TEARS) 1.4 % ophthalmic solution Place 1 drop into both eyes as needed for dry eyes.   Yes Historical Provider, MD  Probiotic Product (PROBIOTIC PO) Take 1 tablet by mouth daily.   Yes Historical Provider, MD  simvastatin (ZOCOR) 20 MG tablet Take 1 tablet by mouth daily. 02/23/14   Historical Provider, MD   BP 102/58 mmHg  Pulse 77  Temp(Src) 98.2 F (36.8 C) (Oral)  Resp 20  Wt 151 lb (68.493 kg)  SpO2 97% Physical Exam  Constitutional: She is oriented to person, place,  and time. She appears well-developed and well-nourished.  HENT:  Head: Normocephalic and atraumatic.  Eyes: EOM are normal. Pupils are equal, round, and reactive to light.  Neck: Neck supple.  Cardiovascular: Normal rate, regular rhythm, normal heart sounds and intact distal pulses.   No murmur heard. Pulmonary/Chest: Effort normal. No respiratory distress.  Abdominal: Soft. She exhibits no distension. There is no tenderness. There is no rebound and no guarding.  Neurological: She is  alert and oriented to person, place, and time. No cranial nerve deficit. Coordination normal.  Skin: Skin is warm and dry.  Nursing note and vitals reviewed.   ED Course  Procedures (including critical care time) Labs Review Labs Reviewed  CBC - Abnormal; Notable for the following:    WBC 13.7 (*)    All other components within normal limits  BASIC METABOLIC PANEL - Abnormal; Notable for the following:    Glucose, Bld 144 (*)    GFR calc non Af Amer 88 (*)    All other components within normal limits  D-DIMER, QUANTITATIVE - Abnormal; Notable for the following:    D-Dimer, Quant 0.52 (*)    All other components within normal limits  PRO B NATRIURETIC PEPTIDE  TROPONIN I  HEPARIN LEVEL (UNFRACTIONATED)  I-STAT TROPOININ, ED    Imaging Review Dg Chest 2 View  09/29/2014   CLINICAL DATA:  Chest pain 2 back.  Shortness of breath  EXAM: CHEST  2 VIEW  COMPARISON:  03/02/2007  FINDINGS: No cardiomegaly. Negative aortic and hilar contours. There is minimal linear opacity at the left base compatible with atelectasis or scarring, subsegmental. There is no edema, consolidation, effusion, or pneumothorax. Cholecystectomy changes.  IMPRESSION: Subsegmental atelectasis or scar in the lingula.   Electronically Signed   By: Jorje Guild M.D.   On: 09/29/2014 02:03     EKG Interpretation   Date/Time:  Thursday September 29 2014 02:18:37 EST Ventricular Rate:  88 PR Interval:  180 QRS Duration: 84 QT Interval:  374 QTC Calculation: 452 R Axis:   -14 Text Interpretation:  Sinus rhythm Low voltage, precordial leads  Borderline T abnormalities, anterior leads No new changes Confirmed by  Kathrynn Humble, MD, Thelma Comp [phone]) on 09/29/2014 2:37:04 AM      MDM   Final diagnoses:  Unstable angina    Differential diagnosis includes: ACS syndrome CHF exacerbation Valvular disorder Myocarditis Pericarditis Pericardial effusion Pneumonia Pleural effusion Pulmonary  edema PE Anemia Musculoskeletal pain Dissection  Pt comes in with cc of chest pain, radiating to the back. Chest pain is midsternal, constant, and radiating to the back. No dissection risk factors and neuro and pulse exam is normal. Pt's HEART score is 4, 2 for age, 1 for hx and 1 for non specific ST changes. We dont see any STEMI criteria - but there is subtle ST depression in the lateral leads. Lung exam is normal - unlikely to be PE.  7:06 AM Given the mild pleuritic type chest pain, Cards wanted [place] to consider d-dimer. Dimer is 0.52. My pretest probability for PE or DVT was low - and patient's AGE ADJUSTED DIMER cutoff is 0.7 - thus PE can be safely excluded with high sensitivity - and we will not get a CT PE.  Cards to see pt. Pt on heparin. NItro paste started.  CRITICAL CARE Performed by: Varney Biles   Total critical care time: 45 min  Critical care time was exclusive of separately billable procedures and treating other patients.  Critical care was necessary to  treat or prevent imminent or life-threatening deterioration.  Critical care was time spent personally by me on the following activities: development of treatment plan with patient and/or surrogate as well as nursing, discussions with consultants, evaluation of patient's response to treatment, examination of patient, obtaining history from patient or surrogate, ordering and performing treatments and interventions, ordering and review of laboratory studies, ordering and review of radiographic studies, pulse oximetry and re-evaluation of patient's condition.   Varney Biles, MD 09/29/14 0301  Varney Biles, MD 09/29/14 3143  Varney Biles, MD 09/29/14 [id number]

## 2014-09-29 NOTE — ED Notes (Signed)
Cards MD at bedside

## 2014-09-29 NOTE — ED Notes (Signed)
Patient ambulated to the restroom without difficulty.  When returned to the room stated that her pain was still a 5/10

## 2014-09-29 NOTE — Progress Notes (Signed)
Patient was complaining of chest pressure 3, nitro given, vitals taken, EKG done, and so was blood work. Vitals was normal, patient states pressure was relieved after nitro was given, will continue to monitor.

## 2014-09-29 NOTE — Progress Notes (Signed)
+  ANTICOAGULATION CONSULT NOTE - Initial Consult  Pharmacy Consult for heparin  Indication: chest pain/ACS  Allergies  Allergen Reactions  . Other Other (See Comments)    Cannot tolerate any nuts due to IBS  . Statins Other (See Comments)    Causes extremity pain and soreness  . Penicillins Rash    REACTION: rash    Patient Measurements:   Heparin Dosing Weight:   Vital Signs: Temp: 98.2 F (36.8 C) (12/17 0120) Temp Source: Oral (12/17 0120) BP: 110/63 mmHg (12/17 0500) Pulse Rate: 85 (12/17 0500)  Labs:  Recent Labs  09/29/14 0124  HGB 12.7  HCT 38.7  PLT 259  CREATININE 0.73    CrCl cannot be calculated (Unknown ideal weight.).   Medical History: Past Medical History  Diagnosis Date  . Pernicious anemia   . IBS (irritable bowel syndrome)     Medications:   (Not in a hospital admission)  Assessment: 63 yo with new onset CP. Heparin per ACS protocol.   Goal of Therapy:  Heparin level 0.3-0.7 units/ml Monitor platelets by anticoagulation protocol: Yes   Plan:    Heparin 3000 unitsx1 then 850 units/hr  6 hour HL  Daily HL and CBC starting 12/18  Connye Burkitt 09/29/2014,5:55 AM

## 2014-09-29 NOTE — H&P (Signed)
CARDIOLOGY ADMISSION NOTE  Patient ID: Jasmin Butler MRN: [id number] DOB/AGE: 12/19/1950 63 y.o.  Admit date: 09/29/2014 Primary Physician   Sherrie Mustache, MD Primary Cardiologist   None Chief Complaint    Chest pain.  HPI:  The patient presents with chest pain.  She has history of chest pain with normal coronary arteries on cath in 2005.  She has been well.  However, she woke with chest pain last night at 12:30.  It was a pressure like pain that was 8/10 at the peak.  She had no radiation to her jaw or arm.  She felt slightly SOB and mildly nauseated but did not throw up.  She did not have palpitations or presyncope although she was slightly light headed.  This was not like the pain she had in 2005.  She did not get relief with ASA but pain did improve after NTG.  Still however, the pain is 2/10.  No acute EKG changes.  Enzymes are negative x 1 with a normal BNP in the ED so far.  D dimer did not suggest a PE.  She otherwise is active at home and does some walking.  She has had no further chest pain.    Past Medical History  Diagnosis Date  . Pernicious anemia   . IBS (irritable bowel syndrome)   . Hyperlipidemia     Past Surgical History  Procedure Laterality Date  . Abdominal hysterectomy    . Cholecystectomy    . Cataract extraction      Allergies  Allergen Reactions  . Other Other (See Comments)    Cannot tolerate any nuts due to IBS  . Statins Other (See Comments)    Causes extremity pain and soreness  . Penicillins Rash    REACTION: rash   Prior to Admission medications   Medication Sig Start Date End Date Taking? Authorizing Provider  B Complex Vitamins (B-COMPLEX/B-12 PO) Take 1 tablet by mouth daily.   Yes Historical Provider, MD  Calcium Polycarbophil (FIBERCON PO) Take 3 tablets by mouth daily.   Yes Historical Provider, MD  cyanocobalamin (,VITAMIN B-12,) 1000 MCG/ML injection Inject 1,000 mcg into the muscle every 30 (thirty) days. 07/04/14  Yes  Historical Provider, MD  docusate sodium (COLACE) 100 MG capsule Take 300 mg by mouth daily.   Yes Historical Provider, MD  NIACIN PO Take 1 tablet by mouth daily.   Yes Historical Provider, MD  polyvinyl alcohol (LIQUIFILM TEARS) 1.4 % ophthalmic solution Place 1 drop into both eyes as needed for dry eyes.   Yes Historical Provider, MD  Probiotic Product (PROBIOTIC PO) Take 1 tablet by mouth daily.   Yes Historical Provider, MD  simvastatin (ZOCOR) 20 MG tablet Take 1 tablet by mouth daily. 02/23/14   Historical Provider, MD     History   Social History  . Marital Status: Married    Spouse Name: N/A    Number of Children: 3  . Years of Education: N/A   Occupational History  . Not on file.   Social History Main Topics  . Smoking status: Never Smoker   . Smokeless tobacco: Not on file  . Alcohol Use: No  . Drug Use: No  . Sexual Activity: Not on file   Other Topics Concern  . Not on file   Social History Narrative   Lives at home with husband [name].    Family History  Problem Relation Age of Onset  . Leukemia Father   . Hypertension Mother   .  Stroke Mother 81  . Cancer Brother     4 different cancers     ROS:  Constipation, intentional weight loss.  Otherwise as stated in the HPI and negative for all other systems.   Physical Exam: Blood pressure 94/59, pulse 77, temperature 98.2 F (36.8 C), temperature source Oral, resp. rate 22, weight 151 lb (68.493 kg), SpO2 96 %.  GENERAL:  Well appearing HEENT:  Pupils equal round and reactive, fundi not visualized, oral mucosa unremarkable NECK:  No jugular venous distention, waveform within normal limits, carotid upstroke brisk and symmetric, no bruits, no thyromegaly LYMPHATICS:  No cervical, inguinal adenopathy LUNGS:  Clear to auscultation bilaterally BACK:  No CVA tenderness CHEST:  Unremarkable HEART:  PMI not displaced or sustained,S1 and S2 within normal limits, no S3, no S4, no clicks, no rubs, no murmurs ABD:   Flat, positive bowel sounds normal in frequency in pitch, no bruits, no rebound, no guarding, no midline pulsatile mass, no hepatomegaly, no splenomegaly EXT:  2 plus pulses throughout, no edema, no cyanosis no clubbing SKIN:  No rashes no nodules NEURO:  Cranial nerves II through XII grossly intact, motor grossly intact throughout PSYCH:  Cognitively intact, oriented to person place and time  Labs: Lab Results  Component Value Date   BUN 9 09/29/2014   Lab Results  Component Value Date   CREATININE 0.73 09/29/2014   Lab Results  Component Value Date   NA 137 09/29/2014   K 4.1 09/29/2014   CL 98 09/29/2014   CO2 24 09/29/2014   Lab Results  Component Value Date   TROPONINI <0.30 09/29/2014   Lab Results  Component Value Date   WBC 13.7* 09/29/2014   HGB 12.7 09/29/2014   HCT 38.7 09/29/2014   MCV 83.6 09/29/2014   PLT 259 09/29/2014     Radiology:  CXR:  No cardiomegaly. Negative aortic and hilar contours. There is minimal linear opacity at the left base compatible with atelectasis or scarring, subsegmental. There is no edema, consolidation, effusion, or pneumothorax. Cholecystectomy changes.  EKG:  NSR, rate 88, LAD, poor anterior R wave progression, no acute ST T wave changes.  09/29/2014  ASSESSMENT AND PLAN:    CHEST PAIN:  The pain has some typical and some atypical features.  Coronary CTA will be the best test to determine presence or absence of obstructive CAD.  We will arrange this.  If this is negative I will suspect a GI etiology.  Continue to cycle enzymes.    HYPERLIPIDEMIA:   She is intolerant of statins.  Further plans for treatment of her dyslipidemia will be based on the findings on cath.  We will need to clarify meds.  She reports that she is intolerant of statins but Zocor is on the list.  She reports that she is not taking the but is taking OTC statin.    SignedMinus Breeding 09/29/2014, 8:30 AM

## 2014-09-29 NOTE — ED Notes (Signed)
Pt. reports mid chest pain " pressure" worse with deep inspiration onset this evening  , mild SOB , denies nausea or diaphoresis .

## 2014-09-29 NOTE — ED Notes (Signed)
Flow manager called for follow up on request that pt go to 3W, currently no 3W beds available. Report called to 2W

## 2014-09-29 NOTE — Progress Notes (Signed)
+  [place] for heparin  Indication: chest pain/ACS  Allergies  Allergen Reactions  . Other Other (See Comments)    Cannot tolerate any nuts due to IBS  . Statins Other (See Comments)    Causes extremity pain and soreness  . Penicillins Rash    REACTION: rash    Patient Measurements: Height: 5\' 3"  (160 cm) Weight: 151 lb (68.493 kg) IBW/kg (Calculated) : 52.4   Vital Signs: Temp: 98.2 F (36.8 C) (12/17 0943) Temp Source: Oral (12/17 0943) BP: 92/51 mmHg (12/17 0943) Pulse Rate: 75 (12/17 0943)  Labs:  Recent Labs  09/29/14 0124 09/29/14 0533 09/29/14 1052 09/29/14 1240  HGB 12.7  --   --   --   HCT 38.7  --   --   --   PLT 259  --   --   --   HEPARINUNFRC  --   --   --  0.33  CREATININE 0.73  --   --   --   TROPONINI  --  <0.30 <0.30  --     Estimated Creatinine Clearance: 65.1 mL/min (by C-G formula based on Cr of 0.73).   Medical History: Past Medical History  Diagnosis Date  . Pernicious anemia   . IBS (irritable bowel syndrome)   . Hyperlipidemia     Medications:  Prescriptions prior to admission  Medication Sig Dispense Refill Last Dose  . B Complex Vitamins (B-COMPLEX/B-12 PO) Take 1 tablet by mouth daily.   09/28/2014 at Unknown time  . Calcium Polycarbophil (FIBERCON PO) Take 3 tablets by mouth daily.   09/28/2014 at Unknown time  . cyanocobalamin (,VITAMIN B-12,) 1000 MCG/ML injection Inject 1,000 mcg into the muscle every 30 (thirty) days.   09/13/14  . docusate sodium (COLACE) 100 MG capsule Take 300 mg by mouth daily.   09/28/2014 at Unknown time  . NIACIN PO Take 1 tablet by mouth daily.   09/28/2014 at Unknown time  . polyvinyl alcohol (LIQUIFILM TEARS) 1.4 % ophthalmic solution Place 1 drop into both eyes as needed for dry eyes.   09/28/2014 at Unknown time  . Probiotic Product (PROBIOTIC PO) Take 1 tablet by mouth daily.   09/28/2014 at Unknown time  . simvastatin (ZOCOR) 20 MG tablet Take 1 tablet  by mouth daily.       Assessment: 63 yo with new onset CP. Initial heparin level is therapeutic at 0.33 units/ml.  Trop (-) d dimer 0.52  Goal of Therapy:  Heparin level 0.3-0.7 units/ml Monitor platelets by anticoagulation protocol: Yes   Plan:   Cont heparin 850 units/hr  6 hour HL to confirm Daily HL and CBC starting 12/18  Thanks for allowing pharmacy to be a part of this patient's care.  Excell Seltzer, PharmD Clinical Pharmacist, [phone] 09/29/2014,1:47 PM

## 2014-09-29 NOTE — Progress Notes (Signed)
+  [place] for heparin  Indication: chest pain/ACS  Allergies  Allergen Reactions  . Other Other (See Comments)    Cannot tolerate any nuts due to IBS  . Statins Other (See Comments)    Causes extremity pain and soreness  . Penicillins Rash    REACTION: rash    Patient Measurements: Height: 5\' 3"  (160 cm) Weight: 151 lb (68.493 kg) IBW/kg (Calculated) : 52.4 Heparin dosing weight: 66 kg  Vital Signs: Temp: 99.2 F (37.3 C) (12/17 2008) Temp Source: Oral (12/17 2008) BP: 106/69 mmHg (12/17 2008) Pulse Rate: 85 (12/17 2008)  Labs:  Recent Labs  09/29/14 0124  09/29/14 1052 09/29/14 1240 09/29/14 1510 09/29/14 1957  HGB 12.7  --   --   --   --   --   HCT 38.7  --   --   --   --   --   PLT 259  --   --   --   --   --   HEPARINUNFRC  --   --   --  0.33  --  0.30  CREATININE 0.73  --   --   --   --   --   TROPONINI  --   < > <0.30 <0.30 <0.30  --   < > = values in this interval not displayed.  Estimated Creatinine Clearance: 65.1 mL/min (by C-G formula based on Cr of 0.73).   Medical History: Past Medical History  Diagnosis Date  . Pernicious anemia   . IBS (irritable bowel syndrome)   . Hyperlipidemia     Medications:  Prescriptions prior to admission  Medication Sig Dispense Refill Last Dose  . B Complex Vitamins (B-COMPLEX/B-12 PO) Take 1 tablet by mouth daily.   09/28/2014 at Unknown time  . Calcium Polycarbophil (FIBERCON PO) Take 3 tablets by mouth daily.   09/28/2014 at Unknown time  . cyanocobalamin (,VITAMIN B-12,) 1000 MCG/ML injection Inject 1,000 mcg into the muscle every 30 (thirty) days.   09/13/14  . docusate sodium (COLACE) 100 MG capsule Take 300 mg by mouth daily.   09/28/2014 at Unknown time  . NIACIN PO Take 1 tablet by mouth daily.   09/28/2014 at Unknown time  . polyvinyl alcohol (LIQUIFILM TEARS) 1.4 % ophthalmic solution Place 1 drop into both eyes as needed for dry eyes.   09/28/2014 at Unknown  time  . Probiotic Product (PROBIOTIC PO) Take 1 tablet by mouth daily.   09/28/2014 at Unknown time  . simvastatin (ZOCOR) 20 MG tablet Take 1 tablet by mouth daily.       Assessment: 63 yo with new onset CP on IV heparin.  Anticoagulation: Repeat heparin level remains therapeutic but trending down. CBC within normal limits this AM. No bleeding reported. No issues with infusion per RN. Patient is complaining of chest pressure this PM which was relieved by nitroglycerin.   Goal of Therapy:  Heparin level 0.3-0.7 units/ml Monitor platelets by anticoagulation protocol: Yes   Plan:   Increase heparin to 950 units/hr to keep at goal. Daily HL and CBC starting 12/18.  Thanks for allowing pharmacy to be a part of this patient's care.  Sloan Leiter, PharmD, BCPS Clinical Pharmacist [phone]  09/29/2014,8:29 PM

## 2014-09-29 NOTE — ED Notes (Signed)
Patient states she only took half of a 325mg  ASA at home.

## 2014-09-30 ENCOUNTER — Observation Stay (HOSPITAL_COMMUNITY): Payer: Medicare Other

## 2014-09-30 DIAGNOSIS — R079 Chest pain, unspecified: Secondary | ICD-10-CM

## 2014-09-30 LAB — HEPARIN LEVEL (UNFRACTIONATED): Heparin Unfractionated: 0.4 IU/mL (ref 0.30–0.70)

## 2014-09-30 LAB — CBC
HCT: 36.1 % (ref 36.0–46.0)
Hemoglobin: 11.7 g/dL — ABNORMAL LOW (ref 12.0–15.0)
MCH: 27.6 pg (ref 26.0–34.0)
MCHC: 32.4 g/dL (ref 30.0–36.0)
MCV: 85.1 fL (ref 78.0–100.0)
Platelets: 218 10*3/uL (ref 150–400)
RBC: 4.24 MIL/uL (ref 3.87–5.11)
RDW: 14 % (ref 11.5–15.5)
WBC: 6.2 10*3/uL (ref 4.0–10.5)

## 2014-09-30 IMAGING — CT CT HEART MORP W/ CTA COR W/ SCORE W/ CA W/CM &/OR W/O CM
1 of 10 series · 1 of 20 positions shown, 2 images · non-contrast
Comparison: CT of the abdomen and pelvis 02/22/2013.

CLINICAL DATA: Chest pain

EXAM:
Cardiac CTA
MEDICATIONS:
Sub lingual nitro. 4mg and lopressor 0mg
TECHNIQUE: The patient was scanned on a [employer] [REDACTED]ice scanner. Gantry
rotation speed was 270 msecs. Collimation was .9mm. A 100 kV
prospective scan was triggered in the descending thoracic aorta at
111 HU's with 5% padding centered around 78% of the R-R interval.
Average HR during the scan was 68 bpm. The 3D data set was
interpreted on a dedicated work station using MPR, MIP and VRT
modes. A total of 80cc of contrast was used.

[Series 300: locator · axial · 0.35mm/px · z∈[-156,-156]mm · 1 of 1 slices shown, 2 images]
[im 1/1  vessel]
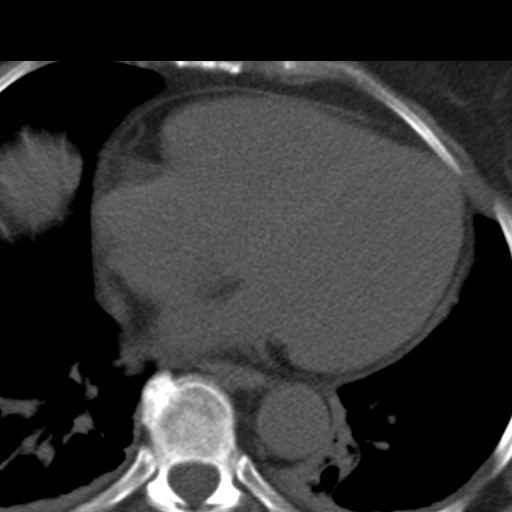
[im 1/1  lung]
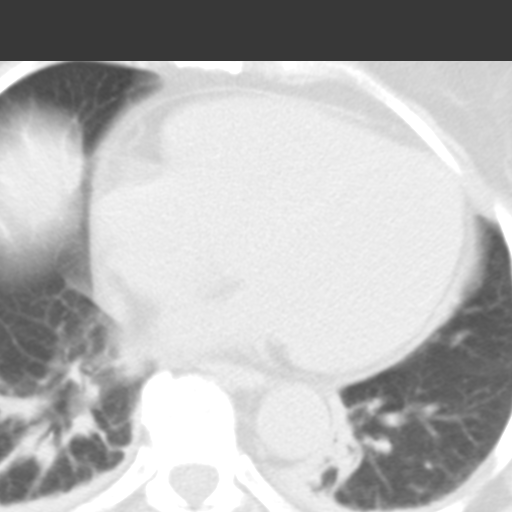

[1 of 20 positions shown; findings below may reference images not displayed]

FINDINGS: Non-cardiac: See separate report from [REDACTED]. Linear
basilar opacities /scarring

Ascending aorta mildly dilated 4.0 cm and Descending Thoracic Aorta
2.9 cm

Calcium Score:

Coronary Arteries:  Left dominant with no anomalies

LM: Normal

LAD:  Normal

D1:  Large and normal

D2:  Small and normal

Circumflex:  Large Left dominant and normal

OM1:  Large vessel normal

PDA/PLA left sided normal

RCA:  Small non dominant and normal
IMPRESSION: 1) Calcium Score 0

2) Normal Left dominant coronary arteries

3) Mild ascending aortic root dilatation

4) Linear basilar opacities /scarring see report by Radiology

Cleto Beaubrun

EXAM:
OVER-READ INTERPRETATION  CT CHEST

The following report is an over-read performed by radiologist Dr.
over-read does not include interpretation of cardiac or coronary
anatomy or pathology. The coronary calcium score/coronary CTA
interpretation by the cardiologist is attached.
FINDINGS: There are multifocal linear opacities and areas of architectural
distortion in the visualized lungs, predominantly within the lower
lobes, an appearance of which is favored to reflect a combination of
post infectious or inflammatory scarring, as well as areas of
atelectasis. There is some overlying mild pleural thickening. No
pleural effusions are noted at this time. No pneumothorax. Within
the visualized portions of the lungs there are no suspicious
appearing pulmonary nodules or masses. No lymphadenopathy within the
visualized portions of the thorax. Visualized portions of the upper
abdomen are generally unremarkable, although the patient is status
post cholecystectomy. Small splenule incidentally noted anterior to
the spleen. There are no aggressive appearing lytic or blastic
lesions noted in the visualized portions of the skeleton.
IMPRESSION: 1. There are extensive linear opacities in lung bases bilaterally,
favored to reflect a combination of areas of chronic post infectious
or inflammatory scarring, as well as more acute regions of
subsegmental atelectasis. There is some overlying mild pleural
thickening.
2. Otherwise, no significant incidental noncardiac findings noted.

## 2014-09-30 MED ORDER — PANTOPRAZOLE SODIUM 40 MG PO TBEC: 40.0000 mg | DELAYED_RELEASE_TABLET | Freq: Every day | ORAL | Status: DC

## 2014-09-30 MED ORDER — METOPROLOL TARTRATE 1 MG/ML IV SOLN
INTRAVENOUS | Status: AC
  Filled 2014-09-30: qty 10

## 2014-09-30 MED ORDER — IOHEXOL 350 MG/ML SOLN
80.0000 mL | Freq: Once | INTRAVENOUS | Status: AC | PRN
  Administered 2014-09-30: 80 mL via INTRAVENOUS

## 2014-09-30 MED ORDER — NITROGLYCERIN 0.4 MG SL SUBL
SUBLINGUAL_TABLET | SUBLINGUAL | Status: AC
  Filled 2014-09-30: qty 1

## 2014-09-30 MED ORDER — PANTOPRAZOLE SODIUM 40 MG PO TBEC
40.0000 mg | DELAYED_RELEASE_TABLET | Freq: Every day | ORAL | Status: DC
  Administered 2014-09-30: 40 mg via ORAL
  Filled 2014-09-30: qty 1

## 2014-09-30 NOTE — Discharge Summary (Signed)
Discharge Summary   Patient ID: Jasmin Butler,  MRN: [id number], DOB/AGE: May 26, 1951 63 y.o.  Admit date: 09/29/2014 Discharge date: 09/30/2014  Primary Care Provider: Sherrie Mustache Primary Cardiologist: New  Discharge Diagnoses Principal Problem:   Chest pain Active Problems:   Hyperlipidemia   Allergies Allergies  Allergen Reactions  . Other Other (See Comments)    Cannot tolerate any nuts due to IBS  . Statins Other (See Comments)    Causes extremity pain and soreness  . Penicillins Rash    REACTION: rash    Procedures  Coronary CT Cardiac portion 09/30/2014  FINDINGS: Non-cardiac: See separate report from [facility]. Linear basilar opacities /scarring  Ascending aorta mildly dilated 4.0 cm and Descending Thoracic Aorta 2.9 cm  Calcium Score:  Coronary Arteries: Left dominant with no anomalies  LM: Normal  LAD: Normal  D1: Large and normal  D2: Small and normal  Circumflex: Large Left dominant and normal  OM1: Large vessel normal  PDA/PLA left sided normal  RCA: Small non dominant and normal  IMPRESSION:  1) Calcium Score 0  2) Normal Left dominant coronary arteries  3) Mild ascending aortic root dilatation  4) Linear basilar opacities /scarring see report by Radiology    Coronary CT Noncardiac portion 09/30/2014 IMPRESSION: 1. There are extensive linear opacities in lung bases bilaterally, favored to reflect a combination of areas of chronic post infectious or inflammatory scarring, as well as more acute regions of subsegmental atelectasis. There is some overlying mild pleural thickening. 2. Otherwise, no significant incidental noncardiac findings noted.    Hospital Course  The patient is a 63 year old Caucasian female with history of hyperlipidemia intolerant to statin who presented to [facility] on 09/29/2014 with chest pain that woke her up from sleep shortly after midnight.  She had normal coronaries on cardiac catheterization in 2005. The chest discomfort was also associated with some shortness of breath and mild nausea. Initial troponin was negative. Significant laboratory finding include grinding of 0.73, white blood cell count 13.7, proBNP 73.  Patient was seen by cardiology group and admitted overnight for observation. Serial troponins overnight were negative. EKG showed no significant ischemic changes. Initial chest x-ray showed subsegmental atelectasis or scar in the lingula. She was initially placed on IV heparin. Since she had a negative cardiac cath in 2005, it was decided to obtain a coronary CT angiogram to assess for any underlying blockages. Patient underwent coronary CT in the morning of 12/18 which showed calcium score 0, normal coronary arteries, mild aortic descending root dilatation. Patient is deemed stable for discharge from cardiac perspective. Of note, overreading of the noncardiac portion of coronary CT did reveal significant linear opacities in the lung bases bilaterally which reflect a combination of chronic post infectious or inflammatory scarring as well as some subsegmental atelectasis. Per patient, she had a history of bilateral pneumonia in the past.  She has been instructed to follow-up with her PCP in 1-2 weeks, she has also been notified of the pulmonary scarring that was seen on CT. She potentially need outpatient pulmonary follow-up in the future if she has any significant chest discomfort or shortness of breath. In the meantime, we will give the patient a prescription for Protonix to help possible GI related symptoms.  Discharge Vitals Blood pressure 107/59, pulse 61, temperature 98 F (36.7 C), temperature source Oral, resp. rate 18, height 5\' 3"  (1.6 m), weight 151 lb (68.493 kg), SpO2 94 %.  Filed Weights   09/29/14 0557 09/29/14 0602  09/29/14 0943  Weight: 151 lb (68.493 kg) 151 lb (68.493 kg) 151 lb (68.493 kg)     Labs  CBC  Recent Labs  09/29/14 0124 09/30/14 0523  WBC 13.7* 6.2  HGB 12.7 11.7*  HCT 38.7 36.1  MCV 83.6 85.1  PLT 259 093   Basic Metabolic Panel  Recent Labs  09/29/14 0124  NA 137  K 4.1  CL 98  CO2 24  GLUCOSE 144*  BUN 9  CREATININE 0.73  CALCIUM 9.8   Cardiac Enzymes  Recent Labs  09/29/14 1052 09/29/14 1240 09/29/14 1510  TROPONINI <0.30 <0.30 <0.30   BNP Invalid input(s): POCBNP D-Dimer  Recent Labs  09/29/14 0619  DDIMER 0.52*    Disposition  Pt is being discharged home today in good condition.  Follow-up Plans & Appointments      Follow-up Information    Follow up with Sherrie Mustache, MD.   Specialty:  Family Medicine   Why:  Followup with your PCP for further workup. Discuss with your PCP regarding bilateral basilar scarring and subsegmental atelectasis and possible outpatient pulmonology followup   Contact information:   [address] [place] [address] [phone]       Follow up with Minus Breeding, MD.   Specialty:  Cardiology   Why:  Contact cardiology as needed, otherwise followup with your PCP   Contact information:   [place] [address] [phone]       Discharge Medications    Medication List    TAKE these medications        B-COMPLEX/B-12 PO  Take 1 tablet by mouth daily.     cyanocobalamin 1000 MCG/ML injection  Commonly known as:  (VITAMIN B-12)  Inject 1,000 mcg into the muscle every 30 (thirty) days.     docusate sodium 100 MG capsule  Commonly known as:  COLACE  Take 300 mg by mouth daily.     FIBERCON PO  Take 3 tablets by mouth daily.     NIACIN PO  Take 1 tablet by mouth daily.     pantoprazole 40 MG tablet  Commonly known as:  PROTONIX  Take 1 tablet (40 mg total) by mouth daily.     polyvinyl alcohol 1.4 % ophthalmic solution  Commonly known as:  LIQUIFILM TEARS  Place 1 drop into both eyes as needed for dry eyes.     PROBIOTIC PO  Take 1  tablet by mouth daily.     simvastatin 20 MG tablet  Commonly known as:  ZOCOR  Take 1 tablet by mouth daily.         Duration of Discharge Encounter   Greater than 30 minutes including physician time.  Hilbert Corrigan PA-C Pager: [phone] 09/30/2014, 12:46 PM

## 2014-09-30 NOTE — Progress Notes (Signed)
Patient Name: Jasmin Butler Date of Encounter: 09/30/2014     Principal Problem:   Chest pain Active Problems:   Hyperlipidemia    SUBJECTIVE  Started with mid substernal CP that woke her up from sleep around midnight in the morning of 09/29/2014, persistent since then.   CURRENT MEDS . aspirin  324 mg Oral Once    OBJECTIVE  Filed Vitals:   09/29/14 1443 09/29/14 1955 09/29/14 2008 09/30/14 0449  BP: 105/60 111/68 106/69 107/59  Pulse: 79 83 85 61  Temp: 98.8 F (37.1 C)  99.2 F (37.3 C) 98 F (36.7 C)  TempSrc: Oral  Oral Oral  Resp: 20  18 18   Height:      Weight:      SpO2: 95% 96% 94% 94%    Intake/Output Summary (Last 24 hours) at 09/30/14 0850 Last data filed at 09/30/14 0300  Gross per 24 hour  Intake    240 ml  Output   1000 ml  Net   -760 ml   Filed Weights   09/29/14 0557 09/29/14 0602 09/29/14 0943  Weight: 151 lb (68.493 kg) 151 lb (68.493 kg) 151 lb (68.493 kg)    PHYSICAL EXAM  General: Pleasant, NAD. Neuro: Alert and oriented X 3. Moves all extremities spontaneously. Psych: Normal affect. HEENT:  Normal  Neck: Supple without bruits or JVD. Lungs:  Resp regular and unlabored, CTA, some bibasilar rale on exam, ?atelectasis Heart: RRR no s3, s4, or murmurs. Abdomen: Soft, non-tender, non-distended, BS + x 4.  Extremities: No clubbing, cyanosis or edema. DP/PT/Radials 2+ and equal bilaterally.  Accessory Clinical Findings  CBC  Recent Labs  09/29/14 0124 09/30/14 0523  WBC 13.7* 6.2  HGB 12.7 11.7*  HCT 38.7 36.1  MCV 83.6 85.1  PLT 259 259   Basic Metabolic Panel  Recent Labs  09/29/14 0124  NA 137  K 4.1  CL 98  CO2 24  GLUCOSE 144*  BUN 9  CREATININE 0.73  CALCIUM 9.8   Cardiac Enzymes  Recent Labs  09/29/14 1052 09/29/14 1240 09/29/14 1510  TROPONINI <0.30 <0.30 <0.30   BNP Invalid input(s): POCBNP D-Dimer  Recent Labs  09/29/14 0619  DDIMER 0.52*    TELE NSR with HR 60s, 1 episode of  v-tach per telemetry however appears to be artifact as QRS is clearly seen with fluntuating P waves    ECG  NSR with HR 80s, TWI in V3 however no other ST-T wave changes   Radiology/Studies  Dg Chest 2 View  09/29/2014   CLINICAL DATA:  Chest pain 2 back.  Shortness of breath  EXAM: CHEST  2 VIEW  COMPARISON:  03/02/2007  FINDINGS: No cardiomegaly. Negative aortic and hilar contours. There is minimal linear opacity at the left base compatible with atelectasis or scarring, subsegmental. There is no edema, consolidation, effusion, or pneumothorax. Cholecystectomy changes.  IMPRESSION: Subsegmental atelectasis or scar in the lingula.   Electronically Signed   By: Jorje Guild M.D.   On: 09/29/2014 02:03    ASSESSMENT AND PLAN  1. Prolonged chest pain  - negative d-dimer. No EKG changes.   - normal coronaries on cath 2005  - pending coronary CT today, have informed Dr. Johnsie Cancel who is willing to read the image, if negative, likely GI, for now will add daily Protonix  - patient now has persistent CP since Midnight of 12/17, neg enzyme and no EKG changes after 33 hrs. Improved after nitro last night. Will need coronary CT  to definitively r/o cardiac origin. Mild bibasilar rale on exam, atelectasis noted on CXR   2. HLD: intolerant to statins.  3. GERD  Signed, Woodward Ku Pager: [phone]  Patient examined chart reviewed  R/O no acute ECG changes  Discussed cardiac CT with patient.  Will do early this am.  Has had contrast before BP soft HR 68 will likely tolerate 2. Mg iv lopressor and SL nitro Will use 120 Kv prospective protocol.  Jenkins Rouge

## 2014-09-30 NOTE — Discharge Instructions (Signed)

## 2014-09-30 NOTE — Progress Notes (Signed)
UR completed 

## 2014-09-30 NOTE — Progress Notes (Signed)
[place] for heparin  Indication: chest pain/ACS  Allergies  Allergen Reactions  . Other Other (See Comments)    Cannot tolerate any nuts due to IBS  . Statins Other (See Comments)    Causes extremity pain and soreness  . Penicillins Rash    REACTION: rash    Patient Measurements: Height: 5\' 3"  (160 cm) Weight: 151 lb (68.493 kg) IBW/kg (Calculated) : 52.4 Heparin dosing weight: 66 kg  Vital Signs: Temp: 98 F (36.7 C) (12/18 0449) Temp Source: Oral (12/18 0449) BP: 107/59 mmHg (12/18 0449) Pulse Rate: 61 (12/18 0449)  Labs:  Recent Labs  09/29/14 0124  09/29/14 1052 09/29/14 1240 09/29/14 1510 09/29/14 1957 09/30/14 0523  HGB 12.7  --   --   --   --   --  11.7*  HCT 38.7  --   --   --   --   --  36.1  PLT 259  --   --   --   --   --  218  HEPARINUNFRC  --   --   --  0.33  --  0.30 0.40  CREATININE 0.73  --   --   --   --   --   --   TROPONINI  --   < > <0.30 <0.30 <0.30  --   --   < > = values in this interval not displayed.  Estimated Creatinine Clearance: 65.1 mL/min (by C-G formula based on Cr of 0.73).   Medical History: Past Medical History  Diagnosis Date  . Pernicious anemia   . IBS (irritable bowel syndrome)   . Hyperlipidemia   . Pneumonia 1980's    "double"  . History of hiatal hernia   . GERD (gastroesophageal reflux disease) 2005  . Headache     "weekly" (09/29/2014)  . Migraine     "2-3 times/yr" (09/29/2014)  . Depression     "couple times/yr" (09/29/2014)    Medications:  Prescriptions prior to admission  Medication Sig Dispense Refill Last Dose  . B Complex Vitamins (B-COMPLEX/B-12 PO) Take 1 tablet by mouth daily.   09/28/2014 at Unknown time  . Calcium Polycarbophil (FIBERCON PO) Take 3 tablets by mouth daily.   09/28/2014 at Unknown time  . cyanocobalamin (,VITAMIN B-12,) 1000 MCG/ML injection Inject 1,000 mcg into the muscle every 30 (thirty) days.   09/13/14  . docusate sodium  (COLACE) 100 MG capsule Take 300 mg by mouth daily.   09/28/2014 at Unknown time  . NIACIN PO Take 1 tablet by mouth daily.   09/28/2014 at Unknown time  . polyvinyl alcohol (LIQUIFILM TEARS) 1.4 % ophthalmic solution Place 1 drop into both eyes as needed for dry eyes.   09/28/2014 at Unknown time  . Probiotic Product (PROBIOTIC PO) Take 1 tablet by mouth daily.   09/28/2014 at Unknown time  . simvastatin (ZOCOR) 20 MG tablet Take 1 tablet by mouth daily.       Assessment: 63 yo with new onset CP on IV heparin.  Anticoagulation: Repeat heparin level remains therapeutic but trending down. CBC within normal limits this AM. No bleeding reported. No issues with infusion per RN. Patient is complaining of chest pressure this PM which was relieved by nitroglycerin.   Goal of Therapy:  Heparin level 0.3-0.7 units/ml Monitor platelets by anticoagulation protocol: Yes   Plan: Continue heparin at 950 units/hr to keep at goal. Daily HL and CBC starting 12/18.  Thanks for allowing pharmacy  to be a part of this patient's care.  Isac Sarna, BS Pharm D, BCPS Clinical Pharmacist 09/30/2014,8:35 AM

## 2014-09-30 NOTE — Progress Notes (Signed)
DC IV and tele per MD orders and protocol; DC instructions reviewed and signed by patient with husband at bedside; no further questions from pt or family.  Rowe Pavy, RN

## 2014-10-26 ENCOUNTER — Telehealth: Payer: Self-pay | Admitting: Internal Medicine

## 2014-10-26 NOTE — Telephone Encounter (Signed)
Pt was advised given her symptoms she should go to the nearest ER. Pt states she was seen in ER 09-29-14 by cardiology and was told this is not heart related and therefore wants to wait a call back from our office to get seen sooner by MW(this week). Pt was advised in the meantime if symptoms worsen to go to nearest ER.

## 2014-10-26 NOTE — Telephone Encounter (Signed)
Called spoke with pt. She reports she is having SOB at night, having hard time inhaling w/o having pain in her back. She is scheduled for consult with MW on 11/03/14 but would like to be seen this week by him. MW not in on Friday. Please advise if pt can be worked in tomorrow MW thanks

## 2014-10-26 NOTE — Telephone Encounter (Signed)
Spoke with the pt and scheduled her for 8:45 am arrive at 8:30 and bring all meds in hand

## 2014-10-26 NOTE — Telephone Encounter (Signed)
Ok to bring in at 7 am and we'll work her into the am schedule

## 2014-10-27 ENCOUNTER — Encounter: Payer: Self-pay | Admitting: Internal Medicine

## 2014-10-27 ENCOUNTER — Ambulatory Visit (HOSPITAL_COMMUNITY)
Admission: RE | Admit: 2014-10-27 | Discharge: 2014-10-27 | Disposition: A | Discharge status: Home / Self Care | Payer: Medicare Other | Source: Ambulatory Visit | Attending: Internal Medicine | Admitting: Internal Medicine

## 2014-10-27 ENCOUNTER — Other Ambulatory Visit (INDEPENDENT_AMBULATORY_CARE_PROVIDER_SITE_OTHER): Payer: Medicare Other

## 2014-10-27 ENCOUNTER — Ambulatory Visit (INDEPENDENT_AMBULATORY_CARE_PROVIDER_SITE_OTHER): Payer: Medicare Other | Admitting: Internal Medicine

## 2014-10-27 VITALS — BP 132/62 | HR 60 | Temp 98.0°F | Ht 63.0 in | Wt 148.2 lb

## 2014-10-27 DIAGNOSIS — R918 Other nonspecific abnormal finding of lung field: Secondary | ICD-10-CM | POA: Insufficient documentation

## 2014-10-27 DIAGNOSIS — R0600 Dyspnea, unspecified: Secondary | ICD-10-CM

## 2014-10-27 DIAGNOSIS — R079 Chest pain, unspecified: Secondary | ICD-10-CM | POA: Diagnosis not present

## 2014-10-27 DIAGNOSIS — E041 Nontoxic single thyroid nodule: Secondary | ICD-10-CM

## 2014-10-27 DIAGNOSIS — R0789 Other chest pain: Secondary | ICD-10-CM

## 2014-10-27 DIAGNOSIS — E785 Hyperlipidemia, unspecified: Secondary | ICD-10-CM

## 2014-10-27 DIAGNOSIS — R0902 Hypoxemia: Secondary | ICD-10-CM | POA: Diagnosis not present

## 2014-10-27 LAB — BASIC METABOLIC PANEL
BUN: 9 mg/dL (ref 6–23)
CALCIUM: 9.8 mg/dL (ref 8.4–10.5)
CO2: 29 meq/L (ref 19–32)
CREATININE: 0.73 mg/dL (ref 0.40–1.20)
Chloride: 103 mEq/L (ref 96–112)
GFR: 84.98 mL/min (ref >60.00)
Glucose, Bld: 109 mg/dL — ABNORMAL HIGH (ref 70–99)
POTASSIUM: 4.3 meq/L (ref 3.5–5.1)
Sodium: 139 mEq/L (ref 135–145)

## 2014-10-27 LAB — CBC WITH DIFFERENTIAL/PLATELET
Basophils Absolute: 0 10*3/uL (ref 0.0–0.1)
Basophils Relative: 0.6 % (ref 0.0–3.0)
EOS PCT: 0.6 % (ref 0.0–5.0)
Eosinophils Absolute: 0 10*3/uL (ref 0.0–0.7)
HEMATOCRIT: 39.5 % (ref 36.0–46.0)
Hemoglobin: 12.9 g/dL (ref 12.0–15.0)
LYMPHS PCT: 25.5 % (ref 12.0–46.0)
Lymphs Abs: 1.8 10*3/uL (ref 0.7–4.0)
MCHC: 32.8 g/dL (ref 30.0–36.0)
MCV: 83.2 fl (ref 78.0–100.0)
Monocytes Absolute: 0.4 10*3/uL (ref 0.1–1.0)
Monocytes Relative: 6 % (ref 3.0–12.0)
NEUTROS ABS: 4.7 10*3/uL (ref 1.4–7.7)
Neutrophils Relative %: 67.3 % (ref 43.0–77.0)
Platelets: 257 10*3/uL (ref 150.0–400.0)
RBC: 4.75 Mil/uL (ref 3.87–5.11)
RDW: 13.6 % (ref 11.5–15.5)
WBC: 6.9 10*3/uL (ref 4.0–10.5)

## 2014-10-27 LAB — SEDIMENTATION RATE: Sed Rate: 43 mm/hr — ABNORMAL HIGH (ref 0–22)

## 2014-10-27 LAB — TSH: TSH: 1.51 u[IU]/mL (ref 0.35–4.50)

## 2014-10-27 MED ORDER — IOHEXOL 350 MG/ML SOLN
80.0000 mL | Freq: Once | INTRAVENOUS | Status: AC | PRN
  Administered 2014-10-27: 58 mL via INTRAVENOUS

## 2014-10-27 NOTE — Assessment & Plan Note (Signed)
See CTa chest 10/27/2014 >>> 1.5 cm on L > rec u/s  Lab Results  Component Value Date   TSH 1.51 10/27/2014

## 2014-10-27 NOTE — Patient Instructions (Addendum)
Pepcid 20 mg after breakfast and supper  Treatment consists of avoiding foods that cause gas (especially [place] food/ beans and raw vegetables like spinach and salads)  and citrucel 1 heaping tsp twice daily with a large glass of water.  Pain should improve w/in 2 weeks and if not then consider further GI work up.   GERD (REFLUX)  is an extremely common cause of respiratory symptoms just like yours , many times with no obvious heartburn at all.    It can be treated with medication, but also with lifestyle changes including avoidance of late meals, excessive alcohol, smoking cessation, and avoid fatty foods, chocolate, peppermint, colas, red wine, and acidic juices such as orange juice.  NO MINT OR MENTHOL PRODUCTS SO NO COUGH DROPS  USE SUGARLESS CANDY INSTEAD (Jolley ranchers or Stover's or Life Savers) or even ice chips will also do - the key is to swallow to prevent all throat clearing. NO OIL BASED VITAMINS - use powdered substitutes.  Please see patient coordinator before you leave today  to schedule CTa chest today    Please remember to go to the lab   department downstairs for your tests - we will call you with the results when they are available.  Please schedule a follow up office visit in 2weeks, sooner if needed and avoid exposure to chicken coops in meantime

## 2014-10-27 NOTE — Assessment & Plan Note (Signed)
10/27/2014  Walked RA x 2 laps @ 185 ft each stopped due to  ? desat vs artifact (note pulse never rose) and sob -CTa 10/27/2014 > neg PE/ ILD  Symptoms of doe are severe and  markedly disproportionate to objective findings and not clear this is a lung problem but pt does appear to have difficult airway management issues.   DDX of  difficult airways management all start with A and  include Adherence, Ace Inhibitors, Acid Reflux, Active Sinus Disease, Alpha 1 Antitripsin deficiency, Anxiety masquerading as Airways dz,  ABPA,  allergy(esp in young), Aspiration (esp in elderly), Adverse effects of DPI,  Active smokers, plus two Bs  = Bronchiectasis and Beta blocker use..and one C= CHF  Adherence is always the initial "prime suspect" and is a multilayered concern that requires a "trust but verify" approach in every patient - starting with knowing how to use medications, especially inhalers, correctly, keeping up with refills and understanding the fundamental difference between maintenance and prns vs those medications only taken for a very short course and then stopped and not refilled.   ? Acid (or non-acid) GERD > always difficult to exclude as up to 75% of pts in some series report no assoc GI/ Heartburn symptoms> rec max (24h)  acid suppression and diet restrictions/ reviewed and instructions given in writing.   ? Anxiety > usually dx of exclusion

## 2014-10-27 NOTE — Progress Notes (Signed)
Subjective:    Patient ID: Jasmin Butler, female    DOB: Aug 28, 1951 MRN: [id number]  HPI  58 yowf never smoker with h/o IBS  acutely ill 09/29/14 with onset of chest pressure to back to [facility] ER:    Admit date: 09/29/2014 Discharge date: 09/30/2014  Primary Care Provider: Sherrie Mustache Primary Cardiologist: New  Discharge Diagnoses Principal Problem:  Chest pain Active Problems:  Hyperlipidemia   Allergies Allergies  Allergen Reactions  . Other Other (See Comments)    Cannot tolerate any nuts due to IBS  . Statins Other (See Comments)    Causes extremity pain and soreness  . Penicillins Rash    REACTION: rash    Procedures  Coronary CT Cardiac portion 09/30/2014  FINDINGS: Non-cardiac: See separate report from [facility]. Linear basilar opacities /scarring  Ascending aorta mildly dilated 4.0 cm and Descending Thoracic Aorta 2.9 cm  Calcium Score:  Coronary Arteries: Left dominant with no anomalies  LM: Normal  LAD: Normal  D1: Large and normal  D2: Small and normal  Circumflex: Large Left dominant and normal  OM1: Large vessel normal  PDA/PLA left sided normal  RCA: Small non dominant and normal  IMPRESSION:  1) Calcium Score 0  2) Normal Left dominant coronary arteries  3) Mild ascending aortic root dilatation  4) Linear basilar opacities /scarring see report by Radiology    Coronary CT Noncardiac portion 09/30/2014 IMPRESSION: 1. There are extensive linear opacities in lung bases bilaterally, favored to reflect a combination of areas of chronic post infectious or inflammatory scarring, as well as more acute regions of subsegmental atelectasis. There is some overlying mild pleural thickening. 2. Otherwise, no significant incidental noncardiac findings noted.    Hospital Course  The patient is a 64 year old Caucasian female with history of hyperlipidemia intolerant  to statin who presented to [facility] on 09/29/2014 with chest pain that woke her up from sleep shortly after midnight. She had normal coronaries on cardiac catheterization in 2005. The chest discomfort was also associated with some shortness of breath and mild nausea. Initial troponin was negative. Significant laboratory finding include grinding of 0.73, white blood cell count 13.7, proBNP 73.  Patient was seen by cardiology group and admitted overnight for observation. Serial troponins overnight were negative. EKG showed no significant ischemic changes. Initial chest x-ray showed subsegmental atelectasis or scar in the lingula. She was initially placed on IV heparin. Since she had a negative cardiac cath in 2005, it was decided to obtain a coronary CT angiogram to assess for any underlying blockages. Patient underwent coronary CT in the morning of 12/18 which showed calcium score 0, normal coronary arteries, mild aortic descending root dilatation. Patient is deemed stable for discharge from cardiac perspective. Of note, overreading of the noncardiac portion of coronary CT did reveal significant linear opacities in the lung bases bilaterally which reflect a combination of chronic post infectious or inflammatory scarring as well as some subsegmental atelectasis. Per patient, she had a history of bilateral pneumonia in the past.  She has been instructed to follow-up with her PCP in 1-2 weeks, she has also been notified of the pulmonary scarring that was seen on CT. She potentially need outpatient pulmonary follow-up in the future if she has any significant chest discomfort or shortness of breath. In the meantime, we will give the patient a prescription for Protonix to help possible GI related symptoms.    10/27/2014 1st [facility] office visit/ Jasmin Butler   Chief Complaint  Patient presents with  . Pulmonary Consult    Pt c/o SOB and chest pressure-onset 09/29/14.  She states that her symptoms  bother her constantly.    w/in 24 h of admitted pain resolved and went home and did fine with activity for a few days then   pressure recurred while    on ppi taken with breakfast assoc w belching which was not present before the med was started.  Discomfort center to back no change with insp or exp but doe 129ft  Is reproducible   Pressure is paroxysmal at rest sitting,  lasts typically no longer than 20 minutes then eases up  l but has 50% of the time, much less when  supine  Sob supine ever since onset not related to chest pressure which happens sitting  No obvious other patterns in day to day or daytime variabilty or assoc chronic cough or cp or chest tightness, subjective wheeze overt sinus or hb symptoms. No unusual exp hx or h/o childhood pna/ asthma or knowledge of premature birth.  Sleeping ok without nocturnal  or early am exacerbation  of respiratory  c/o's or need for noct saba. Also denies any obvious fluctuation of symptoms with weather or environmental changes or other aggravating or alleviating factors except as outlined above   Current Medications, Allergies, Complete Past Medical History, Past Surgical History, Family History, and Social History were reviewed in [employer] record.           Review of Systems  Constitutional: Negative for fever, chills and unexpected weight change.  HENT: Negative for congestion, dental problem, ear pain, nosebleeds, postnasal drip, rhinorrhea, sinus pressure, sneezing, sore throat, trouble swallowing and voice change.   Eyes: Negative for visual disturbance.  Respiratory: Positive for shortness of breath. Negative for cough and choking.   Cardiovascular: Positive for chest pain. Negative for leg swelling.  Gastrointestinal: Negative for vomiting, abdominal pain and diarrhea.  Genitourinary: Negative for difficulty urinating.  Musculoskeletal: Negative for arthralgias.  Skin: Negative for rash.  Neurological:  Negative for tremors, syncope and headaches.  Hematological: Does not bruise/bleed easily.       Objective:   Physical Exam  amb wf nad  Wt Readings from Last 3 Encounters:  10/27/14 148 lb 3.2 oz (67.223 kg)  09/29/14 151 lb (68.493 kg)    Vital signs reviewed  HEENT: nl dentition, turbinates, and orophanx. Nl external ear canals without cough reflex   NECK :  without JVD/Nodes/TM/ nl carotid upstrokes bilaterally   LUNGS: no acc muscle use, clear to A and P bilaterally without cough on insp or exp maneuvers   CV:  RRR  no s3 or murmur or increase in P2, no edema   ABD:  soft and nontender with nl excursion in the supine position. No bruits or organomegaly, bowel sounds nl  MS:  warm without deformities, calf tenderness, cyanosis or clubbing  SKIN: warm and dry without lesions    NEURO:  alert, approp, no deficits    Labs ordered today / images reviewed include:   CTa chest: 10/27/2014  No evidence of pulmonary emboli or thoracic aortic aneurysm.  Mild central ground-glass opacities which are nonspecific but may represent mild edema.  Cardiomegaly    Lab Results  Component Value Date   ESRSEDRATE 43* 10/27/2014     Lab Results  Component Value Date   PROBNP 73.0 09/29/2014     Lab Results  Component Value Date   WBC 6.9 10/27/2014  HGB 12.9 10/27/2014   HCT 39.5 10/27/2014   MCV 83.2 10/27/2014   PLT 257.0 10/27/2014   Lab Results  Component Value Date   TSH 1.51 10/27/2014        Assessment & Plan:

## 2014-10-27 NOTE — Assessment & Plan Note (Signed)
Classic subdiaphragmatic pain pattern suggests ibs:  Stereotypical, migratory with a very limited distribution of pain locations, daytime, not exacerbated by ex or coughing, worse in sitting position, associated with generalized abd bloating, not present supine due to the dome effect of the diaphragm is  canceled in that position. Frequently these patients have had multiple negative GI workups and CT scans.  Treatment consists of avoiding foods that cause gas (especially beans and raw vegetables like spinach and salads)  and citrucel 1 heaping tsp twice daily with a large glass of water.  Pain should improve w/in 2 weeks and if not then consider further GI work up.

## 2014-10-27 NOTE — Assessment & Plan Note (Signed)
Esr = 43 so could have some form of ILD   Will check hsp profile and f/u with pfts/ HRCT next

## 2014-10-28 ENCOUNTER — Telehealth: Payer: Self-pay | Admitting: Internal Medicine

## 2014-10-28 DIAGNOSIS — E041 Nontoxic single thyroid nodule: Secondary | ICD-10-CM

## 2014-10-28 NOTE — Telephone Encounter (Signed)
Spoke with pt and gave her results earlier this morning. She would like to go ahead and the thyroid ultrasound MW would like her to have. Order has been placed.

## 2014-11-01 ENCOUNTER — Telehealth: Payer: Self-pay | Admitting: Internal Medicine

## 2014-11-01 ENCOUNTER — Encounter: Payer: Self-pay | Admitting: *Deleted

## 2014-11-01 ENCOUNTER — Ambulatory Visit (HOSPITAL_COMMUNITY)
Admission: RE | Admit: 2014-11-01 | Discharge: 2014-11-01 | Disposition: A | Discharge status: Home / Self Care | Payer: Medicare Other | Source: Ambulatory Visit | Attending: Internal Medicine | Admitting: Internal Medicine

## 2014-11-01 DIAGNOSIS — E042 Nontoxic multinodular goiter: Secondary | ICD-10-CM | POA: Diagnosis not present

## 2014-11-01 DIAGNOSIS — E041 Nontoxic single thyroid nodule: Secondary | ICD-10-CM | POA: Diagnosis present

## 2014-11-01 NOTE — Progress Notes (Signed)
Quick Note:  Spoke with pt and notified of results per Dr. Melvyn Novas. Pt verbalized understanding and denied any questions. Pt states unsure how she would like to proceed She states that she is seeing her PCP on 11/15/14 and will discuss then  We will discuss more when she f/u with MW on 11/10/14 ______

## 2014-11-01 NOTE — Telephone Encounter (Signed)
  Notes Recorded by Rosana Berger, CMA on 11/01/2014 at 3:47 PM Spoke with pt and notified of results per Dr. Melvyn Novas. Pt verbalized understanding and denied any questions. Pt states unsure how she would like to proceed She states that she is seeing her PCP on 11/15/14 and will discuss then  We will discuss more when she f/u with MW on 11/10/14 ------  Notes Recorded by Tanda Rockers, MD on 11/01/2014 at 3:03 PM Call patient : Study is c/w thyroid nodule as suspected and radiology recommends it be biopsied so would do under u/s or she could see her primary and discuss careful serial f/u but need to make sure we close the loop either way ---  I called spoke with pt. She reports she wants [place] to order the Biopsy for her. Please advise if okay MW thanks

## 2014-11-03 ENCOUNTER — Institutional Professional Consult (permissible substitution): Payer: Medicare Other | Admitting: Internal Medicine

## 2014-11-08 ENCOUNTER — Institutional Professional Consult (permissible substitution): Payer: Medicare Other | Admitting: Internal Medicine

## 2014-11-08 NOTE — Telephone Encounter (Signed)
Spoke with pt and advised of Dr Gustavus Bryant recommendations.  Pt states that her pcp told her they would need records from our office before they can schedule this.  Records faxed to Dr Edrick Oh.  Instructed pt to f/u with their office later today to see if records were received and to contact our office if anything further is needed.  Pt verbalized understanding.

## 2014-11-08 NOTE — Telephone Encounter (Signed)
That's fine - I would prefer her pcp advise her on this as it not something I normally do

## 2014-11-08 NOTE — Telephone Encounter (Signed)
MW please advise.  Thanks.  

## 2014-11-09 ENCOUNTER — Other Ambulatory Visit: Payer: Self-pay | Admitting: Family Medicine

## 2014-11-09 DIAGNOSIS — E041 Nontoxic single thyroid nodule: Secondary | ICD-10-CM

## 2014-11-10 ENCOUNTER — Ambulatory Visit: Payer: Medicare Other | Admitting: Internal Medicine

## 2014-11-16 ENCOUNTER — Other Ambulatory Visit (HOSPITAL_COMMUNITY)
Admission: RE | Admit: 2014-11-16 | Discharge: 2014-11-16 | Disposition: A | Discharge status: Home / Self Care | Payer: Medicare Other | Source: Ambulatory Visit | Attending: Interventional Radiology | Admitting: Interventional Radiology

## 2014-11-16 ENCOUNTER — Ambulatory Visit
Admission: RE | Admit: 2014-11-16 | Discharge: 2014-11-16 | Disposition: A | Discharge status: Home / Self Care | Payer: Medicare Other | Source: Ambulatory Visit | Attending: Family Medicine | Admitting: Family Medicine

## 2014-11-16 DIAGNOSIS — E041 Nontoxic single thyroid nodule: Secondary | ICD-10-CM | POA: Diagnosis present

## 2014-11-17 ENCOUNTER — Ambulatory Visit: Payer: BC Managed Care – PPO | Admitting: Cardiology

## 2014-11-30 ENCOUNTER — Other Ambulatory Visit: Payer: Self-pay

## 2014-12-07 ENCOUNTER — Ambulatory Visit (INDEPENDENT_AMBULATORY_CARE_PROVIDER_SITE_OTHER): Payer: Medicare Other | Admitting: Cardiology

## 2014-12-07 ENCOUNTER — Encounter: Payer: Self-pay | Admitting: Cardiology

## 2014-12-07 VITALS — BP 118/78 | HR 72 | Ht 63.0 in | Wt 149.0 lb

## 2014-12-07 DIAGNOSIS — R072 Precordial pain: Secondary | ICD-10-CM

## 2014-12-07 NOTE — Patient Instructions (Signed)
The current medical regimen is effective;  continue present plan and medications.  Follow up as needed.  Thank you for choosing [facility]!!

## 2014-12-07 NOTE — Progress Notes (Signed)
HPI The patient presents for follow-up after hospitalization for chest pain. I saw her at that time in the emergency room. We actually did a coronary CT angiography which was normal demonstrating no calcium no coronary disease. Because she had some dyspnea she also had a BNP level which was normal. She has since been back to see pulmonary with no clear etiology identified for some shortness of breath. Continues to get occasional burning chest discomfort but this is mild. This seems to be intermittent as does her dyspnea. She's not describing any classic substernal chest pressure, neck or arm discomfort. She's not having any PND or orthopnea. She has had no weight gain or edema. She is being evaluated for thyroid nodules. However, TSH was normal.  Allergies  Allergen Reactions  . Other Other (See Comments)    Cannot tolerate any nuts due to IBS  . Statins Other (See Comments)    Causes extremity pain and soreness  . Penicillins Rash    REACTION: rash    Current Outpatient Prescriptions  Medication Sig Dispense Refill  . busPIRone (BUSPAR) 10 MG tablet Take 10 mg by mouth 3 (three) times daily.    . cyanocobalamin (,VITAMIN B-12,) 1000 MCG/ML injection INJECT 1 ML INTRAMUSCULARLY EVERY 30 DAYS    . Probiotic Product (PROBIOTIC PO) Take 1 tablet by mouth daily.     No current facility-administered medications for this visit.    Past Medical History  Diagnosis Date  . Pernicious anemia   . IBS (irritable bowel syndrome)   . Hyperlipidemia   . Pneumonia 1980's    "double"  . History of hiatal hernia   . GERD (gastroesophageal reflux disease) 2005  . Headache     "weekly" (09/29/2014)  . Migraine     "2-3 times/yr" (09/29/2014)  . Depression     "couple times/yr" (09/29/2014)    Past Surgical History  Procedure Laterality Date  . Cholecystectomy open  ~ 1990  . Cataract extraction w/ intraocular lens  implant, bilateral Bilateral 2000's  . Abdominal hysterectomy  1985  .  Tubal ligation  ~ 1983  . Cardiac catheterization  ~ 2005    ROS:   As stated in the HPI and negative for all other systems.  PHYSICAL EXAM BP 118/78 mmHg  Pulse 72  Ht 5\' 3"  (1.6 m)  Wt 149 lb (67.586 kg)  BMI 26.40 kg/m2 GENERAL:  Well appearing NECK:  No jugular venous distention, waveform within normal limits, carotid upstroke brisk and symmetric, no bruits, no thyromegaly LUNGS:  Clear to auscultation bilaterally BACK:  No CVA tenderness CHEST:  Unremarkable HEART:  PMI not displaced or sustained,S1 and S2 within normal limits, no S3, no S4, no clicks, no rubs, no murmurs ABD:  Flat, positive bowel sounds normal in frequency in pitch, no bruits, no rebound, no guarding, no midline pulsatile mass, no hepatomegaly, no splenomegaly EXT:  2 plus pulses throughout, no edema, no cyanosis no clubbing    ASSESSMENT AND PLAN   CHEST PAIN:  I see no cardiac etiology for this.  It is atypical and she had a normal CT (excpet for some mild pulmonary findings).  No further pulmonary work up is indicated.  DYSPNEA:  This seems to be intermittent and mild. I don't see evidence of heart failure or other cardiac etiology. If this is progressive we could consider cardiopulmonary stress testing in the future.  MILD ASCENDING AO DILATATION:  She should be referred back to me in about 24 months at  which point I would like perform an echo to follow up.

## 2015-03-17 DIAGNOSIS — E042 Nontoxic multinodular goiter: Secondary | ICD-10-CM | POA: Insufficient documentation

## 2015-04-21 ENCOUNTER — Ambulatory Visit (INDEPENDENT_AMBULATORY_CARE_PROVIDER_SITE_OTHER): Payer: Medicare Other | Admitting: Endocrinology

## 2015-04-21 ENCOUNTER — Encounter: Payer: Self-pay | Admitting: Endocrinology

## 2015-04-21 VITALS — BP 112/78 | HR 74 | Temp 98.3°F | Resp 16 | Ht 63.0 in | Wt 151.0 lb

## 2015-04-21 DIAGNOSIS — E041 Nontoxic single thyroid nodule: Secondary | ICD-10-CM | POA: Diagnosis not present

## 2015-04-21 NOTE — Patient Instructions (Signed)
Let's recheck the ultrasound.  We'll let you know about the results. Please return in 1 year. most of the time, a "lumpy thyroid" will eventually become overactive.  this is usually a slow process, happening over the span of many years.

## 2015-04-21 NOTE — Progress Notes (Signed)
Subjective:    Patient ID: Jasmin Butler, female    DOB: Oct 24, 1950, 64 y.o.   MRN: [id number]  HPI In early 2016, pt was noted incidentally on a CT scan to have a left thyroid nodule.  She had bx.  Pt says she had never had a thyroid problem before.  she has no h/o XRT or surgery to the neck.  She has intermittent difficulty with swallowing, and assoc heat intolerance throughout the body.  Past Medical History  Diagnosis Date  . Pernicious anemia   . IBS (irritable bowel syndrome)   . Hyperlipidemia   . Pneumonia 1980's    "double"  . History of hiatal hernia   . GERD (gastroesophageal reflux disease) 2005  . Headache     "weekly" (09/29/2014)  . Migraine     "2-3 times/yr" (09/29/2014)  . Depression     "couple times/yr" (09/29/2014)    Past Surgical History  Procedure Laterality Date  . Cholecystectomy open  ~ 1990  . Cataract extraction w/ intraocular lens  implant, bilateral Bilateral 2000's  . Abdominal hysterectomy  1985  . Tubal ligation  ~ 1983  . Cardiac catheterization  ~ 2005    History   Social History  . Marital Status: Married    Spouse Name: N/A  . Number of Children: 3  . Years of Education: N/A   Occupational History  . Retired- Engineer, materials    Social History Main Topics  . Smoking status: Never Smoker   . Smokeless tobacco: Never Used  . Alcohol Use: No  . Drug Use: No  . Sexual Activity: Not Currently   Other Topics Concern  . Not on file   Social History Narrative   Lives at home with husband [name].    Current Outpatient Prescriptions on File Prior to Visit  Medication Sig Dispense Refill  . busPIRone (BUSPAR) 10 MG tablet Take 10 mg by mouth 3 (three) times daily.    . cyanocobalamin (,VITAMIN B-12,) 1000 MCG/ML injection INJECT 1 ML INTRAMUSCULARLY EVERY 30 DAYS    . Probiotic Product (PROBIOTIC PO) Take 1 tablet by mouth daily.     No current facility-administered medications on file prior to visit.    Allergies  Allergen  Reactions  . Other Other (See Comments)    Cannot tolerate any nuts due to IBS  . Statins Other (See Comments)    Causes extremity pain and soreness  . Penicillins Rash    REACTION: rash    Family History  Problem Relation Age of Onset  . Leukemia Father   . Hypertension Mother   . Stroke Mother 18  . Cancer Brother     4 different cancers  . Thyroid disease Mother     BP 112/78 mmHg  Pulse 74  Temp(Src) 98.3 F (36.8 C) (Oral)  Resp 16  Ht 5\' 3"  (1.6 m)  Wt 151 lb (68.493 kg)  BMI 26.76 kg/m2  SpO2 94%  Review of Systems Denies neck pain and sob.     Objective:   Physical Exam VITAL SIGNS:  See vs page GENERAL: no distress eyes: no periorbital swelling, no proptosis NECK: There is no palpable thyroid enlargement.  No discrete thyroid nodule is palpable, but there is some fullness at the left thyroid area.   Nodes: No palpable lymphadenopathy at the anterior neck. Skin: not diaphoretic.   Neuro: no tremor.   Ext: no edema.     Radiol: thyroid US (11/01/14): Left thyroid lobe: There is  a 9 x 11 x 5 mm solid nodule, superior pole. There is a 19 x 16 x 11 mm hypoechoic nodule with few scattered calcifications, inferior pole corresponding to the CT finding.  Lab Results  Component Value Date   TSH 1.51 10/27/2014   Thyroid cytology: nondiagnostic.    Assessment & Plan:  multinodular goiter, new: We discussed re-bx now vs repeat US.  She chooses repeat US Dysphagia: i advised pt this is not thyroid-related.  She should have eval if clinically indicated.  Patient is advised the following: Patient Instructions  Let's recheck the ultrasound.  We'll let you know about the results. Please return in 1 year. most of the time, a "lumpy thyroid" will eventually become overactive.  this is usually a slow process, happening over the span of many years.

## 2015-04-24 ENCOUNTER — Ambulatory Visit
Admission: RE | Admit: 2015-04-24 | Discharge: 2015-04-24 | Disposition: A | Discharge status: Home / Self Care | Payer: Medicare Other | Source: Ambulatory Visit | Attending: Endocrinology | Admitting: Endocrinology

## 2015-04-24 DIAGNOSIS — E041 Nontoxic single thyroid nodule: Secondary | ICD-10-CM

## 2016-01-12 ENCOUNTER — Encounter: Payer: Self-pay | Admitting: Endocrinology

## 2016-01-12 ENCOUNTER — Ambulatory Visit (INDEPENDENT_AMBULATORY_CARE_PROVIDER_SITE_OTHER): Payer: Medicare Other | Admitting: Endocrinology

## 2016-01-12 ENCOUNTER — Ambulatory Visit
Admission: RE | Admit: 2016-01-12 | Discharge: 2016-01-12 | Disposition: A | Discharge status: Home / Self Care | Payer: Medicare Other | Source: Ambulatory Visit | Attending: Endocrinology | Admitting: Endocrinology

## 2016-01-12 VITALS — BP 116/62 | HR 74 | Temp 98.6°F | Ht 63.0 in | Wt 157.0 lb

## 2016-01-12 DIAGNOSIS — E041 Nontoxic single thyroid nodule: Secondary | ICD-10-CM

## 2016-01-12 DIAGNOSIS — R1310 Dysphagia, unspecified: Secondary | ICD-10-CM | POA: Diagnosis not present

## 2016-01-12 LAB — TSH: TSH: 2.4 u[IU]/mL (ref 0.35–4.50)

## 2016-01-12 NOTE — Patient Instructions (Signed)
A thyroid blood test is requested for you today.  We'll let you know about the results. Please see a specialist about the difficulty swallowing.  you will receive a phone call, about a day and time for an appointment. Let's recheck the ultrasound.  you will receive a phone call, about a day and time for an appointment. Please return in 1 year.

## 2016-01-12 NOTE — Progress Notes (Signed)
Subjective:    Patient ID: Jasmin Butler, female    DOB: [birth year], 65 y.o.   MRN: [id number]  HPI Pt returns for f/u of multinodular goiter (dx'ed early 2016, incidentally on a CT scan; bx cytol was nondiagnostic; she declined re-bx). She reports few months of intermittent solid-only dysphagia at the chest, but no assoc pain Past Medical History  Diagnosis Date  . Pernicious anemia   . IBS (irritable bowel syndrome)   . Hyperlipidemia   . Pneumonia 1980's    "double"  . History of hiatal hernia   . GERD (gastroesophageal reflux disease) 2005  . Headache     "weekly" (09/29/2014)  . Migraine     "2-3 times/yr" (09/29/2014)  . Depression     "couple times/yr" (09/29/2014)    Past Surgical History  Procedure Laterality Date  . Cholecystectomy open  ~ 1990  . Cataract extraction w/ intraocular lens  implant, bilateral Bilateral 2000's  . Abdominal hysterectomy  1985  . Tubal ligation  ~ 1983  . Cardiac catheterization  ~ 2005    Social History   Social History  . Marital Status: Married    Spouse Name: N/A  . Number of Children: 3  . Years of Education: N/A   Occupational History  . Retired- Engineer, materials    Social History Main Topics  . Smoking status: Never Smoker   . Smokeless tobacco: Never Used  . Alcohol Use: No  . Drug Use: No  . Sexual Activity: Not Currently   Other Topics Concern  . Not on file   Social History Narrative   Lives at home with husband [name].    Current Outpatient Prescriptions on File Prior to Visit  Medication Sig Dispense Refill  . busPIRone (BUSPAR) 10 MG tablet Take 10 mg by mouth 3 (three) times daily.    . cyanocobalamin (,VITAMIN B-12,) 1000 MCG/ML injection INJECT 1 ML INTRAMUSCULARLY EVERY 30 DAYS    . Multiple Vitamins-Minerals (ALIVE WOMENS GUMMY PO) Take 1 tablet by mouth daily.    . Probiotic Product (PROBIOTIC PO) Take 1 tablet by mouth daily.     No current facility-administered medications on file prior to  visit.    Allergies  Allergen Reactions  . Other Other (See Comments)    Cannot tolerate any nuts due to IBS  . Statins Other (See Comments)    Causes extremity pain and soreness  . Penicillins Rash    REACTION: rash    Family History  Problem Relation Age of Onset  . Leukemia Father   . Hypertension Mother   . Stroke Mother 61  . Cancer Brother     4 different cancers  . Thyroid disease Mother     BP 116/62 mmHg  Pulse 74  Temp(Src) 98.6 F (37 C) (Oral)  Ht 5\' 3"  (1.6 m)  Wt 157 lb (71.215 kg)  BMI 27.82 kg/m2  SpO2 96%  Review of Systems Denies weight change and neck pain    Objective:   Physical Exam VITAL SIGNS:  See vs page GENERAL: no distress NECK: i cannot notice the goiter.      Assessment & Plan:  Multinodular goiter, due for recheck.  Dysphagia, worse   Patient is advised the following: Patient Instructions  A thyroid blood test is requested for you today.  We'll let you know about the results. Please see a specialist about the difficulty swallowing.  you will receive a phone call, about a day and time for an appointment.  Let's recheck the ultrasound.  you will receive a phone call, about a day and time for an appointment. Please return in 1 year.

## 2016-01-17 ENCOUNTER — Encounter: Payer: Self-pay | Admitting: Gastroenterology

## 2016-03-18 ENCOUNTER — Ambulatory Visit: Payer: Medicare Other | Admitting: Gastroenterology

## 2016-04-22 ENCOUNTER — Ambulatory Visit: Payer: Medicare Other | Admitting: Endocrinology

## 2017-01-13 ENCOUNTER — Ambulatory Visit: Payer: Medicare Other | Admitting: Endocrinology

## 2017-09-30 ENCOUNTER — Ambulatory Visit: Payer: Self-pay | Admitting: Pediatrics

## 2017-10-20 ENCOUNTER — Ambulatory Visit: Payer: Self-pay | Admitting: Family Medicine

## 2017-10-27 DIAGNOSIS — R69 Illness, unspecified: Secondary | ICD-10-CM | POA: Diagnosis not present

## 2017-10-31 ENCOUNTER — Ambulatory Visit: Payer: Self-pay | Admitting: Pediatrics

## 2017-11-03 ENCOUNTER — Encounter: Payer: Self-pay | Admitting: Pediatrics

## 2017-11-03 ENCOUNTER — Ambulatory Visit (INDEPENDENT_AMBULATORY_CARE_PROVIDER_SITE_OTHER): Payer: Medicare HMO | Admitting: Pediatrics

## 2017-11-03 VITALS — BP 117/70 | HR 75 | Temp 98.1°F | Ht 63.0 in | Wt 130.6 lb

## 2017-11-03 DIAGNOSIS — F419 Anxiety disorder, unspecified: Secondary | ICD-10-CM

## 2017-11-03 DIAGNOSIS — K219 Gastro-esophageal reflux disease without esophagitis: Secondary | ICD-10-CM

## 2017-11-03 DIAGNOSIS — E039 Hypothyroidism, unspecified: Secondary | ICD-10-CM | POA: Diagnosis not present

## 2017-11-03 DIAGNOSIS — E538 Deficiency of other specified B group vitamins: Secondary | ICD-10-CM

## 2017-11-03 DIAGNOSIS — F339 Major depressive disorder, recurrent, unspecified: Secondary | ICD-10-CM

## 2017-11-03 DIAGNOSIS — D510 Vitamin B12 deficiency anemia due to intrinsic factor deficiency: Secondary | ICD-10-CM

## 2017-11-03 DIAGNOSIS — R69 Illness, unspecified: Secondary | ICD-10-CM | POA: Diagnosis not present

## 2017-11-03 DIAGNOSIS — Z1159 Encounter for screening for other viral diseases: Secondary | ICD-10-CM | POA: Diagnosis not present

## 2017-11-03 DIAGNOSIS — E785 Hyperlipidemia, unspecified: Secondary | ICD-10-CM

## 2017-11-03 MED ORDER — BUSPIRONE HCL 10 MG PO TABS: 10.0000 mg | ORAL_TABLET | Freq: Three times a day (TID) | ORAL | 3 refills | Status: DC

## 2017-11-03 MED ORDER — ESCITALOPRAM OXALATE 10 MG PO TABS: ORAL_TABLET | ORAL | 3 refills | Status: DC

## 2017-11-03 MED ORDER — SYNTHROID 100 MCG PO TABS: 100.0000 ug | ORAL_TABLET | Freq: Every day | ORAL | 3 refills | Status: DC

## 2017-11-03 MED ORDER — PANTOPRAZOLE SODIUM 40 MG PO TBEC: 40.0000 mg | DELAYED_RELEASE_TABLET | Freq: Every day | ORAL | 3 refills | Status: DC

## 2017-11-03 NOTE — Progress Notes (Addendum)
Subjective:   Patient ID: Jasmin Butler, female    DOB: 04/26/1951, 67 y.o.   MRN: [id number] CC: New Patient (Initial Visit)  HPI: Jasmin Butler is a 67 y.o. female presenting for New Patient (Initial Visit)  Husband died two weeks ago, had cancer then unexpectedly had PE in clinic  Pernicious anemia: B12 shots around 1st of each month, husband was giving them to her at home  Anxiety: has been on buspar for 20+ years, off and on  Depression: off and on for years, stress at home with husband before he died She has lots of support from his children Is not interested in grief counseling right now Does feel like her depression is getting worse Eating minimally  Depression screen [facility] 2/9 11/03/2017  Decreased Interest 3  Down, Depressed, Hopeless 3  PHQ - 2 Score 6  Altered sleeping 2  Tired, decreased energy 1  Change in appetite 3  Feeling bad or failure about yourself  0  Trouble concentrating 2  Moving slowly or fidgety/restless 1  Suicidal thoughts 0  PHQ-9 Score 15  Difficult doing work/chores Somewhat difficult    Past Medical History:  Diagnosis Date  . Anxiety   . Cataract   . Depression    "couple times/yr" (09/29/2014)  . GERD (gastroesophageal reflux disease) 2005  . Headache    "weekly" (09/29/2014)  . History of hiatal hernia   . Hyperlipidemia   . IBS (irritable bowel syndrome)   . Migraine    "2-3 times/yr" (09/29/2014)  . Pernicious anemia   . Pneumonia 1980's   "double"  . Thyroid disease    Family History  Problem Relation Age of Onset  . Leukemia Father   . Cancer Father   . Hypertension Mother   . Stroke Mother 24  . Thyroid disease Mother   . Arthritis Mother   . Cancer Brother        4 different cancers  . Asthma Brother   . Birth defects Son    Social History   Socioeconomic History  . Marital status: Widowed    Spouse name: None  . Number of children: 3  . Years of education: None  . Highest education level: None  Social  Needs  . Financial resource strain: None  . Food insecurity - worry: None  . Food insecurity - inability: None  . Transportation needs - medical: None  . Transportation needs - non-medical: None  Occupational History  . Occupation: Retired- Engineer, materials  Tobacco Use  . Smoking status: Never Smoker  . Smokeless tobacco: Never Used  Substance and Sexual Activity  . Alcohol use: No  . Drug use: No  . Sexual activity: Not Currently  Other Topics Concern  . None  Social History Narrative   Lives at home with husband [name].   ROS: All systems negative other than what is in HPI  Objective:    BP 117/70   Pulse 75   Temp 98.1 F (36.7 C) (Oral)   Ht '5\' 3"'$  (1.6 m)   Wt 130 lb 9.6 oz (59.2 kg)   BMI 23.13 kg/m   Wt Readings from Last 3 Encounters:  11/03/17 130 lb 9.6 oz (59.2 kg)  01/12/16 157 lb (71.2 kg)  04/21/15 151 lb (68.5 kg)    Gen: NAD, alert, cooperative with exam, NCAT EYES: EOMI, no conjunctival injection, or no icterus ENT:  R TM obscured by cerumen, L TM nl, OP without erythema LYMPH: no cervical LAD CV:  NRRR, normal S1/S2, no murmur, distal pulses 2+ b/l Resp: CTABL, no wheezes, normal WOB Abd: +BS, soft, NTND. no guarding or organomegaly Ext: No edema, warm Neuro: Alert and oriented, strength equal b/l UE and LE, coordination grossly normal MSK: normal muscle bulk  Psych: normal affect, tearful at times  Assessment & Plan:  Magen was seen today for new patient (initial visit).  Diagnoses and all orders for this visit:  Depression, recurrent ([place]) Will start below -     escitalopram (LEXAPRO) 10 MG tablet; Take half tab for 2 weeks then take full tab  Need for hepatitis C screening test -     Hepatitis C antibody  Hypothyroidism, unspecified type Recheck labs -     TSH -     SYNTHROID 100 MCG tablet; Take 1 tablet (100 mcg total) by mouth daily before breakfast.  Vitamin B12 deficiency Check level, gets qmonthly B12 shots for pernicious  anemia -     Vitamin B12  Gastroesophageal reflux disease, esophagitis presence not specified Doesn't take daily, takes with meals that she knows to cause reflux such as lasagna -     pantoprazole (PROTONIX) 40 MG tablet; Take 1 tablet (40 mg total) by mouth daily.  Anxiety Taking below regularly for the past month, helps some with anxiety -     busPIRone (BUSPAR) 10 MG tablet; Take 1 tablet (10 mg total) by mouth 3  (three) times daily.  Hyperlipidemia, unspecified hyperlipidemia type -     Lipid panel -     BMP8+EGFR   Follow up plan: Return in about 6 weeks (around 12/15/2017). Assunta Found, MD [facility]  ADDENDUM: TSH low, decreased synthroid dose from 14mg to 879m

## 2017-11-04 ENCOUNTER — Other Ambulatory Visit: Payer: Medicare HMO

## 2017-11-04 DIAGNOSIS — Z1211 Encounter for screening for malignant neoplasm of colon: Secondary | ICD-10-CM

## 2017-11-04 LAB — LIPID PANEL
CHOL/HDL RATIO: 2.8 ratio (ref 0.0–4.4)
Cholesterol, Total: 140 mg/dL (ref 100–199)
HDL: 50 mg/dL (ref >39)
LDL Calculated: 75 mg/dL (ref 0–99)
Triglycerides: 75 mg/dL (ref 0–149)
VLDL CHOLESTEROL CAL: 15 mg/dL (ref 5–40)

## 2017-11-04 LAB — BMP8+EGFR
BUN / CREAT RATIO: 10 — AB (ref 12–28)
BUN: 7 mg/dL — ABNORMAL LOW (ref 8–27)
CHLORIDE: 102 mmol/L (ref 96–106)
CO2: 26 mmol/L (ref 20–29)
Calcium: 8.9 mg/dL (ref 8.7–10.3)
Creatinine, Ser: 0.7 mg/dL (ref 0.57–1.00)
GFR, EST AFRICAN AMERICAN: 103 mL/min/{1.73_m2} (ref >59)
GFR, EST NON AFRICAN AMERICAN: 89 mL/min/{1.73_m2} (ref >59)
Glucose: 111 mg/dL — ABNORMAL HIGH (ref 65–99)
POTASSIUM: 4.2 mmol/L (ref 3.5–5.2)
Sodium: 141 mmol/L (ref 134–144)

## 2017-11-04 LAB — VITAMIN B12: Vitamin B-12: 769 pg/mL (ref 232–1245)

## 2017-11-04 LAB — TSH: TSH: 0.023 u[IU]/mL — ABNORMAL LOW (ref 0.450–4.500)

## 2017-11-04 LAB — HEPATITIS C ANTIBODY: Hep C Virus Ab: <0.1 s/co ratio (ref 0.0–0.9)

## 2017-11-04 MED ORDER — SYNTHROID 88 MCG PO TABS: 88.0000 ug | ORAL_TABLET | Freq: Every day | ORAL | 1 refills | Status: DC

## 2017-11-04 NOTE — Addendum Note (Signed)
Addended by: Eustaquio Maize on: 11/04/2017 07:03 AM   Modules accepted: Orders

## 2017-11-05 DIAGNOSIS — Z1211 Encounter for screening for malignant neoplasm of colon: Secondary | ICD-10-CM | POA: Diagnosis not present

## 2017-11-06 LAB — FECAL OCCULT BLOOD, IMMUNOCHEMICAL: FECAL OCCULT BLD: NEGATIVE

## 2017-11-14 ENCOUNTER — Ambulatory Visit (INDEPENDENT_AMBULATORY_CARE_PROVIDER_SITE_OTHER): Payer: Medicare HMO | Admitting: *Deleted

## 2017-11-14 DIAGNOSIS — D510 Vitamin B12 deficiency anemia due to intrinsic factor deficiency: Secondary | ICD-10-CM | POA: Diagnosis not present

## 2017-11-14 MED ORDER — CYANOCOBALAMIN 1000 MCG/ML IJ SOLN
1000.0000 ug | INTRAMUSCULAR | Status: DC
  Administered 2017-11-14 – 2020-05-11 (×31): 1000 ug via INTRAMUSCULAR

## 2017-11-14 NOTE — Progress Notes (Signed)
Pt given cyanocobalamin inj Tolerated well 

## 2017-11-22 DIAGNOSIS — J40 Bronchitis, not specified as acute or chronic: Secondary | ICD-10-CM | POA: Diagnosis not present

## 2017-11-22 DIAGNOSIS — J0110 Acute frontal sinusitis, unspecified: Secondary | ICD-10-CM | POA: Diagnosis not present

## 2017-11-22 DIAGNOSIS — R05 Cough: Secondary | ICD-10-CM | POA: Diagnosis not present

## 2017-11-24 ENCOUNTER — Ambulatory Visit (INDEPENDENT_AMBULATORY_CARE_PROVIDER_SITE_OTHER): Payer: Medicare HMO | Admitting: Pediatrics

## 2017-11-24 ENCOUNTER — Encounter: Payer: Self-pay | Admitting: Pediatrics

## 2017-11-24 VITALS — BP 133/80 | HR 66 | Temp 98.4°F | Resp 20 | Ht 63.0 in | Wt 131.8 lb

## 2017-11-24 DIAGNOSIS — M79675 Pain in left toe(s): Secondary | ICD-10-CM | POA: Diagnosis not present

## 2017-11-24 DIAGNOSIS — M21612 Bunion of left foot: Secondary | ICD-10-CM | POA: Diagnosis not present

## 2017-11-24 DIAGNOSIS — J069 Acute upper respiratory infection, unspecified: Secondary | ICD-10-CM

## 2017-11-24 NOTE — Progress Notes (Signed)
  Subjective:   Patient ID: Jasmin Butler, female    DOB: 28-Nov-1950, 67 y.o.   MRN: [id number] CC: Toe Pain (Great, left) and Bronchitis  HPI: Jasmin Butler is a 67 y.o. female presenting for Toe Pain (Great, left) and Bronchitis  Started getting sick 3 days ago. Seen at urgent care. Had steroid shot, codeine cough syrup for URI symptoms 2 days ago. Not feeling much better now. Still with some nasal congestion.  Appetite has been down  L great toe MTP joint bothering her off and on for past 6 weeks. Doesn't remember any injury. Did wear shoes that hurt the toe several weeks ago.  Has pains that go from the joint to the top of her toe off and on.  Does not hurt all the time.  Toes never been swollen or red.  No fevers.  Tight shoes seem to bother the toe more.  Not taking anything for the toe.  Relevant past medical, surgical, family and social history reviewed. Allergies and medications reviewed and updated. Social History   Tobacco Use  Smoking Status Never Smoker  Smokeless Tobacco Never Used   ROS: Per HPI   Objective:    BP 133/80   Pulse 66   Temp 98.4 F (36.9 C) (Oral)   Resp 20   Ht 5\' 3"  (1.6 m)   Wt 131 lb 12.8 oz (59.8 kg)   SpO2 99%   BMI 23.35 kg/m   Wt Readings from Last 3 Encounters:  11/24/17 131 lb 12.8 oz (59.8 kg)  11/03/17 130 lb 9.6 oz (59.2 kg)  01/12/16 157 lb (71.2 kg)    Gen: NAD, alert, cooperative with exam, NCAT, congested EYES: EOMI, no conjunctival injection, or no icterus ENT:  TMs dull gray b/l, OP without erythema LYMPH: no cervical LAD CV: NRRR, normal S1/S2, no murmur, distal pulses 2+ b/l Resp: CTABL, no wheezes, normal WOB Abd: +BS, soft, NTND. no guarding or organomegaly Ext: No edema, warm Neuro: Alert and oriented MSK: Bunion present left first toe, no swelling or redness in the foot.  Normal range of motion toes and ankle.  Assessment & Plan:  Jasmin Butler was seen today for toe pain and bronchitis.  Diagnoses and all orders for  this visit:  Acute URI Symptomatic care discussed, return precautions discussed  Pain of toe of left foot Bunion of great toe of left foot Discussed appropriate footwear, avoid pointy shoes and high heels.  If continues to be bothersome will refer to podiatry.  Follow up plan: Return in about 3 months (around 02/21/2018). Assunta Found, MD [place]

## 2017-12-12 ENCOUNTER — Ambulatory Visit (INDEPENDENT_AMBULATORY_CARE_PROVIDER_SITE_OTHER): Payer: Medicare HMO | Admitting: *Deleted

## 2017-12-12 DIAGNOSIS — D510 Vitamin B12 deficiency anemia due to intrinsic factor deficiency: Secondary | ICD-10-CM

## 2017-12-12 DIAGNOSIS — E538 Deficiency of other specified B group vitamins: Secondary | ICD-10-CM

## 2017-12-12 NOTE — Progress Notes (Signed)
Pt given cyanocobalamin inj Tolerated well 

## 2017-12-15 ENCOUNTER — Ambulatory Visit: Payer: Medicare HMO | Admitting: Pediatrics

## 2018-01-12 ENCOUNTER — Ambulatory Visit (INDEPENDENT_AMBULATORY_CARE_PROVIDER_SITE_OTHER): Payer: Medicare HMO | Admitting: *Deleted

## 2018-01-12 DIAGNOSIS — D510 Vitamin B12 deficiency anemia due to intrinsic factor deficiency: Secondary | ICD-10-CM | POA: Diagnosis not present

## 2018-01-12 DIAGNOSIS — E538 Deficiency of other specified B group vitamins: Secondary | ICD-10-CM

## 2018-01-12 NOTE — Progress Notes (Signed)
Cyanocobalamin inj given by Leitha Bleak Pt tolerated well

## 2018-01-16 ENCOUNTER — Ambulatory Visit: Payer: Medicare HMO | Admitting: Pediatrics

## 2018-01-16 ENCOUNTER — Ambulatory Visit (INDEPENDENT_AMBULATORY_CARE_PROVIDER_SITE_OTHER): Payer: Medicare HMO | Admitting: Physician Assistant

## 2018-01-16 ENCOUNTER — Encounter: Payer: Self-pay | Admitting: Physician Assistant

## 2018-01-16 ENCOUNTER — Ambulatory Visit (INDEPENDENT_AMBULATORY_CARE_PROVIDER_SITE_OTHER): Payer: Medicare HMO

## 2018-01-16 VITALS — BP 114/66 | HR 58 | Temp 97.5°F | Ht 63.0 in | Wt 135.0 lb

## 2018-01-16 DIAGNOSIS — M21612 Bunion of left foot: Secondary | ICD-10-CM

## 2018-01-16 DIAGNOSIS — M79672 Pain in left foot: Secondary | ICD-10-CM

## 2018-01-16 MED ORDER — MELOXICAM 7.5 MG PO TABS: 7.5000 mg | ORAL_TABLET | Freq: Every day | ORAL | 2 refills | Status: DC

## 2018-01-16 NOTE — Patient Instructions (Signed)

## 2018-01-16 NOTE — Progress Notes (Signed)
BP 114/66   Pulse (!) 58   Temp (!) 97.5 F (36.4 C) (Oral)   Ht 5\' 3"  (1.6 m)   Wt 135 lb (61.2 kg)   BMI 23.91 kg/m    Subjective:    Patient ID: Jasmin Butler, female    DOB: Aug 18, 1951, 67 y.o.   MRN: [id number]  HPI: Jasmin Butler is a 67 y.o. female presenting on 01/16/2018 for Foot Pain (left ) Patient comes in for recheck on her left foot pain.  She was seen a few weeks ago and states that it is not doing much better.  The pain is actually worse.  And she is having to walk on the lateral portion of her foot to keep the pressure off of the medial area where she is most tender.  She has never had a history of gout before.  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  She tries to wear as good as she is as possible for support.   Past Medical History:  Diagnosis Date  . Anxiety   . Cataract   . Depression    "couple times/yr" (09/29/2014)  . GERD (gastroesophageal reflux disease) 2005  . Headache    "weekly" (09/29/2014)  . History of hiatal hernia   . Hyperlipidemia   . IBS (irritable bowel syndrome)   . Migraine    "2-3 times/yr" (09/29/2014)  . Pernicious anemia   . Pneumonia 1980's   "double"  . Thyroid disease    Relevant past medical, surgical, family and social history reviewed and updated as indicated. Interim medical history since our last visit reviewed. Allergies and medications reviewed and updated. DATA REVIEWED: CHART IN EPIC  Family History reviewed for pertinent findings.  Review of Systems  Constitutional: Negative.   HENT: Negative.   Eyes: Negative.   Respiratory: Negative.   Gastrointestinal: Negative.   Genitourinary: Negative.   Musculoskeletal: Positive for arthralgias and joint swelling.    Allergies as of 01/16/2018      Reactions   Statins Other (See Comments), Rash   Causes extremity pain and soreness Causes extremity pain and soreness   Other Other (See Comments)   Cannot tolerate any nuts due to IBS     Penicillins Rash   REACTION: rash      Medication List        Accurate as of 01/16/18 10:34 AM. Always use your most recent med list.          busPIRone 10 MG tablet Commonly known as:  BUSPAR Take 1 tablet (10 mg total) by mouth 3 (three) times daily.   cyanocobalamin 1000 MCG/ML injection Commonly known as:  (VITAMIN B-12) INJECT 1 ML INTRAMUSCULARLY EVERY 30 DAYS   meloxicam 7.5 MG tablet Commonly known as:  MOBIC Take 1 tablet (7.5 mg total) by mouth daily.   MULTI-VITAMINS Tabs Take by mouth.   pravastatin 40 MG tablet Commonly known as:  PRAVACHOL Take 40 mg by mouth.   SYNTHROID 88 MCG tablet Generic drug:  levothyroxine Take 1 tablet (88 mcg total) by mouth daily before breakfast.          Objective:    BP 114/66   Pulse (!) 58   Temp (!) 97.5 F (36.4 C) (Oral)   Ht 5\' 3"  (1.6 m)   Wt 135 lb (61.2 kg)   BMI 23.91 kg/m   Allergies  Allergen Reactions  . Statins Other (See Comments) and Rash  Causes extremity pain and soreness Causes extremity pain and soreness  . Other Other (See Comments)    Cannot tolerate any nuts due to IBS  . Penicillins Rash    REACTION: rash    Wt Readings from Last 3 Encounters:  01/16/18 135 lb (61.2 kg)  11/24/17 131 lb 12.8 oz (59.8 kg)  11/03/17 130 lb 9.6 oz (59.2 kg)    Physical Exam  Constitutional: She is oriented to person, place, and time. She appears well-developed and well-nourished.  HENT:  Head: Normocephalic and atraumatic.  Eyes: Pupils are equal, round, and reactive to light. Conjunctivae and EOM are normal.  Cardiovascular: Normal rate, regular rhythm, normal heart sounds and intact distal pulses.  Pulmonary/Chest: Effort normal and breath sounds normal.  Abdominal: Soft. Bowel sounds are normal.  Musculoskeletal:       Left foot: There is tenderness and deformity.       Feet:  Neurological: She is alert and oriented to person, place, and time. She has normal reflexes.  Skin: Skin is  warm and dry. No rash noted.  Psychiatric: She has a normal mood and affect. Her behavior is normal. Judgment and thought content normal.        Assessment & Plan:   1. Left foot pain - DG Foot Complete Left; Future - Ambulatory referral to Sports Medicine  2. Bunion of great toe of left foot - Ambulatory referral to Sports Medicine   Continue all other maintenance medications as listed above.  Follow up plan: Return if symptoms worsen or fail to improve.  Educational handout given for eye abrasion  Terald Sleeper PA-C [facility] [address]  [place], [place] [address] [phone]   01/16/2018, 10:34 AM

## 2018-01-20 ENCOUNTER — Encounter: Payer: Self-pay | Admitting: Sports Medicine

## 2018-01-20 ENCOUNTER — Ambulatory Visit (INDEPENDENT_AMBULATORY_CARE_PROVIDER_SITE_OTHER): Payer: Medicare HMO | Admitting: Sports Medicine

## 2018-01-20 VITALS — BP 112/76 | HR 69 | Ht 63.0 in | Wt 134.8 lb

## 2018-01-20 DIAGNOSIS — M79675 Pain in left toe(s): Secondary | ICD-10-CM | POA: Diagnosis not present

## 2018-01-20 DIAGNOSIS — M79672 Pain in left foot: Secondary | ICD-10-CM

## 2018-01-20 DIAGNOSIS — M21612 Bunion of left foot: Secondary | ICD-10-CM | POA: Diagnosis not present

## 2018-01-20 NOTE — Progress Notes (Signed)
  Jasmin Butler. Jasmin Butler, [place] [facility] Jasmin Butler - 67 y.o. female MRN [id number]  Date of birth: 04-May-1951  Visit Date: 01/20/2018  PCP: Eustaquio Maize, MD   Referred by: Terald Sleeper, PA-C  Scribe for today's visit: Josepha Pigg, CMA     SUBJECTIVE:  Jasmin Butler is here for New Patient (Initial Visit) (L great toe bunion) .  Referred by: Particia Nearing, PA Her L great toe symptoms INITIALLY: Began about 3 month aog and MOI is unknown. She may have worn a pair of shoes that where too tight for a couple of days.  Described as moderate sharp shooting pain that comes and goes, radiating to the medial aspect of the foot.  Worsened with weightbearing.  Improved with rest Additional associated symptoms include: She denies redness and swelling.     At this time symptoms show no change compared to onset  She has been walking on the lateral border of foot to avoid painful area. She has tried taking medication for arthritis but stopped after reading side effects. She has tried applying Biofreeze with minimal relief.   XR L foot 01/16/18.   ROS Reports night time disturbances. Denies fevers, chills, or night sweats. Denies unexplained weight loss. Denies personal history of cancer. Denies changes in bowel or bladder habits. Denies recent unreported falls. Denies new or worsening dyspnea or wheezing. Denies headaches or dizziness.  Denies numbness, tingling or weakness  In the extremities.  Denies dizziness or presyncopal episodes Denies lower extremity edema    HISTORY & PERTINENT PRIOR DATA:  Prior History reviewed and updated per electronic medical record.  Significant/pertinent history, findings, studies include:  reports that she has never smoked. She has never used smokeless tobacco. No results for input(s): HGBA1C, LABURIC, CREATINE in the last 8760 hours. No specialty comments available. No problems  updated.  OBJECTIVE:  VS:  HT:5\' 3"  (160 cm)   WT:134 lb 12.8 oz (61.1 kg)  BMI:23.88    BP:112/76  HR:69bpm  TEMP: ( )  RESP:96 %   PHYSICAL EXAM: Constitutional: WDWN, Non-toxic appearing. Psychiatric: Alert & appropriately interactive.  Not depressed or anxious appearing. Respiratory: No increased work of breathing.  Trachea Midline Eyes: Pupils are equal.  EOM intact without nystagmus.  No scleral icterus  Vascular Exam: warm to touch no edema  lower extremity neuro exam: unremarkable normal strength normal sensation  MSK Exam: Left foot with small bunion deformity and positive morton's foot Small morton's callus bilaterally    ASSESSMENT & PLAN:   1. Left foot pain   2. Bunion of great toe of left foot   3. Pain of toe of left foot     PLAN:  Medium metatarsal pad added to shoes Cool water soaks F/u 4 weeks as needed if persistent symptoms     Please see additional documentation for Objective, Assessment and Plan sections. Pertinent additional documentation may be included in corresponding procedure notes, imaging studies, problem based documentation and patient instructions. Please see these sections of the encounter for additional information regarding this visit.  CMA/ATC served as Education administrator during this visit. History, Physical, and Plan performed by medical provider. Documentation and orders reviewed and attested to.      Jasmin Butler, [facility] Physician

## 2018-02-11 ENCOUNTER — Ambulatory Visit (INDEPENDENT_AMBULATORY_CARE_PROVIDER_SITE_OTHER): Payer: Medicare HMO | Admitting: *Deleted

## 2018-02-11 DIAGNOSIS — D510 Vitamin B12 deficiency anemia due to intrinsic factor deficiency: Secondary | ICD-10-CM | POA: Diagnosis not present

## 2018-02-11 DIAGNOSIS — E538 Deficiency of other specified B group vitamins: Secondary | ICD-10-CM

## 2018-02-11 NOTE — Progress Notes (Signed)
Pt given Cyanocobalamin inj Tolerated well 

## 2018-02-23 ENCOUNTER — Ambulatory Visit: Payer: Medicare HMO | Admitting: Pediatrics

## 2018-03-13 ENCOUNTER — Ambulatory Visit (INDEPENDENT_AMBULATORY_CARE_PROVIDER_SITE_OTHER): Payer: Medicare HMO | Admitting: *Deleted

## 2018-03-13 DIAGNOSIS — E538 Deficiency of other specified B group vitamins: Secondary | ICD-10-CM

## 2018-03-13 DIAGNOSIS — D510 Vitamin B12 deficiency anemia due to intrinsic factor deficiency: Secondary | ICD-10-CM

## 2018-03-13 NOTE — Progress Notes (Signed)
Pt given Cyanocobalamin inj Tolerated well 

## 2018-03-18 ENCOUNTER — Ambulatory Visit (INDEPENDENT_AMBULATORY_CARE_PROVIDER_SITE_OTHER): Payer: Medicare HMO | Admitting: Physician Assistant

## 2018-03-18 ENCOUNTER — Encounter: Payer: Self-pay | Admitting: Physician Assistant

## 2018-03-18 VITALS — BP 136/63 | HR 62 | Temp 97.7°F | Ht 63.0 in | Wt 135.0 lb

## 2018-03-18 DIAGNOSIS — E785 Hyperlipidemia, unspecified: Secondary | ICD-10-CM

## 2018-03-18 DIAGNOSIS — E538 Deficiency of other specified B group vitamins: Secondary | ICD-10-CM | POA: Diagnosis not present

## 2018-03-18 DIAGNOSIS — E039 Hypothyroidism, unspecified: Secondary | ICD-10-CM

## 2018-03-18 NOTE — Progress Notes (Signed)
BP 136/63   Pulse 62   Temp 97.7 F (36.5 C) (Oral)   Ht _0  (1.6 m)   Wt 135 lb (61.2 kg)   BMI 23.91 kg/m    Subjective:    Patient ID: Jasmin Butler, female    DOB: 11/23/1950, 67 y.o.   MRN: [id number]  HPI: Jasmin Butler is a 67 y.o. female presenting on 03/18/2018 for Hypothyroidism and Fatigue  This patient comes in with a complaint of fatigue.  Over the past 2 weeks she has had a significant increase in fatigue.  Her husband did pass away about 5 months ago.  She feels that she is doing somewhat better in that area of depression.  She does have a long family history of leukemia.  She does have hypothyroidism, pernicious anemia.  She is not fasting today so we will have her come back in just another day or so and the lab order has been placed.  She has no other specific complaints in her body concerning the fatigue.  Past Medical History:  Diagnosis Date  . Anxiety   . Cataract   . Depression    "couple times/yr" (09/29/2014)  . GERD (gastroesophageal reflux disease) 2005  . Headache    "weekly" (09/29/2014)  . History of hiatal hernia   . Hyperlipidemia   . IBS (irritable bowel syndrome)   . Migraine    "2-3 times/yr" (09/29/2014)  . Pernicious anemia   . Pneumonia 1980's   "double"  . Thyroid disease    Relevant past medical, surgical, family and social history reviewed and updated as indicated. Interim medical history since our last visit reviewed. Allergies and medications reviewed and updated. DATA REVIEWED: CHART IN EPIC  Family History reviewed for pertinent findings.  Review of Systems  Constitutional: Positive for fatigue. Negative for activity change and fever.  HENT: Negative.   Eyes: Negative.   Respiratory: Negative.  Negative for cough, shortness of breath and wheezing.   Cardiovascular: Negative.  Negative for chest pain, palpitations and leg swelling.  Gastrointestinal: Negative.  Negative for abdominal pain.  Endocrine: Negative.     Genitourinary: Negative.  Negative for dysuria.  Musculoskeletal: Negative.   Skin: Negative.   Neurological: Negative.  Negative for dizziness and headaches.    Allergies as of 03/18/2018      Reactions   Statins Other (See Comments), Rash   Causes extremity pain and soreness Causes extremity pain and soreness   Other Other (See Comments)   Cannot tolerate any nuts due to IBS   Penicillins Rash   REACTION: rash      Medication List        Accurate as of 03/18/18  1:16 PM. Always use your most recent med list.          cyanocobalamin 1000 MCG/ML injection Commonly known as:  (VITAMIN B-12) INJECT 1 ML INTRAMUSCULARLY EVERY 30 DAYS   MULTI-VITAMINS Tabs Take by mouth.   SYNTHROID 88 MCG tablet Generic drug:  levothyroxine Take 1 tablet (88 mcg total) by mouth daily before breakfast.          Objective:    BP 136/63   Pulse 62   Temp 97.7 F (36.5 C) (Oral)   Ht _1  (1.6 m)   Wt 135 lb (61.2 kg)   BMI 23.91 kg/m   Allergies  Allergen Reactions  . Statins Other (See Comments) and Rash    Causes extremity pain and soreness Causes extremity pain and  soreness  . Other Other (See Comments)    Cannot tolerate any nuts due to IBS  . Penicillins Rash    REACTION: rash    Wt Readings from Last 3 Encounters:  03/18/18 135 lb (61.2 kg)  01/20/18 134 lb 12.8 oz (61.1 kg)  01/16/18 135 lb (61.2 kg)    Physical Exam  Constitutional: She is oriented to person, place, and time. She appears well-developed and well-nourished.  HENT:  Head: Normocephalic and atraumatic.  Eyes: Pupils are equal, round, and reactive to light. Conjunctivae and EOM are normal.  Cardiovascular: Normal rate, regular rhythm, normal heart sounds and intact distal pulses.  Pulmonary/Chest: Effort normal and breath sounds normal.  Abdominal: Soft. Bowel sounds are normal.  Neurological: She is alert and oriented to person, place, and time. She has normal reflexes.  Skin: Skin is warm and  dry. No rash noted.  Psychiatric: She has a normal mood and affect. Her behavior is normal. Judgment and thought content normal.    Results for orders placed or performed in visit on 11/04/17  Fecal occult blood, imunochemical  Result Value Ref Range   Fecal Occult Bld Negative Negative      Assessment & Plan:   1. Vitamin B12 deficiency - CBC with Differential/Platelet; Future - Vitamin B12; Future  2. Hypothyroidism, unspecified type - TSH; Future  3. Hyperlipidemia, unspecified hyperlipidemia type - CMP14+EGFR; Future - Lipid panel; Future   Continue all other maintenance medications as listed above.  Follow up plan: No follow-ups on file.  Educational handout given for [place] PA-C [facility] [address]  [place], [place] [address] [phone]   03/18/2018, 1:16 PM

## 2018-03-19 ENCOUNTER — Other Ambulatory Visit: Payer: Medicare HMO

## 2018-03-19 DIAGNOSIS — E785 Hyperlipidemia, unspecified: Secondary | ICD-10-CM | POA: Diagnosis not present

## 2018-03-19 DIAGNOSIS — E039 Hypothyroidism, unspecified: Secondary | ICD-10-CM | POA: Diagnosis not present

## 2018-03-19 DIAGNOSIS — E538 Deficiency of other specified B group vitamins: Secondary | ICD-10-CM | POA: Diagnosis not present

## 2018-03-20 ENCOUNTER — Other Ambulatory Visit: Payer: Self-pay | Admitting: Physician Assistant

## 2018-03-20 LAB — CBC WITH DIFFERENTIAL/PLATELET
Basophils Absolute: 0.1 10*3/uL (ref 0.0–0.2)
Basos: 2 %
EOS (ABSOLUTE): 0.1 10*3/uL (ref 0.0–0.4)
Eos: 2 %
Hematocrit: 38.4 % (ref 34.0–46.6)
Hemoglobin: 12.4 g/dL (ref 11.1–15.9)
Immature Grans (Abs): 0 10*3/uL (ref 0.0–0.1)
Immature Granulocytes: 0 %
Lymphocytes Absolute: 2 10*3/uL (ref 0.7–3.1)
Lymphs: 39 %
MCH: 27 pg (ref 26.6–33.0)
MCHC: 32.3 g/dL (ref 31.5–35.7)
MCV: 84 fL (ref 79–97)
Monocytes Absolute: 0.3 10*3/uL (ref 0.1–0.9)
Monocytes: 6 %
Neutrophils Absolute: 2.6 10*3/uL (ref 1.4–7.0)
Neutrophils: 51 %
Platelets: 250 10*3/uL (ref 150–450)
RBC: 4.59 x10E6/uL (ref 3.77–5.28)
RDW: 14.6 % (ref 12.3–15.4)
WBC: 5 10*3/uL (ref 3.4–10.8)

## 2018-03-20 LAB — CMP14+EGFR
A/G RATIO: 2 (ref 1.2–2.2)
ALK PHOS: 59 IU/L (ref 39–117)
ALT: 14 IU/L (ref 0–32)
AST: 19 IU/L (ref 0–40)
Albumin: 4.5 g/dL (ref 3.6–4.8)
BILIRUBIN TOTAL: 0.4 mg/dL (ref 0.0–1.2)
BUN / CREAT RATIO: 16 (ref 12–28)
BUN: 10 mg/dL (ref 8–27)
CHLORIDE: 103 mmol/L (ref 96–106)
CO2: 25 mmol/L (ref 20–29)
Calcium: 9.2 mg/dL (ref 8.7–10.3)
Creatinine, Ser: 0.62 mg/dL (ref 0.57–1.00)
GFR calc non Af Amer: 93 mL/min/{1.73_m2} (ref >59)
GFR, EST AFRICAN AMERICAN: 107 mL/min/{1.73_m2} (ref >59)
Globulin, Total: 2.2 g/dL (ref 1.5–4.5)
Glucose: 102 mg/dL — ABNORMAL HIGH (ref 65–99)
POTASSIUM: 4.6 mmol/L (ref 3.5–5.2)
Sodium: 142 mmol/L (ref 134–144)
TOTAL PROTEIN: 6.7 g/dL (ref 6.0–8.5)

## 2018-03-20 LAB — LIPID PANEL
CHOL/HDL RATIO: 4.3 ratio (ref 0.0–4.4)
CHOLESTEROL TOTAL: 260 mg/dL — AB (ref 100–199)
HDL: 61 mg/dL (ref >39)
LDL CALC: 176 mg/dL — AB (ref 0–99)
TRIGLYCERIDES: 114 mg/dL (ref 0–149)
VLDL CHOLESTEROL CAL: 23 mg/dL (ref 5–40)

## 2018-03-20 LAB — TSH: TSH: 1.36 u[IU]/mL (ref 0.450–4.500)

## 2018-03-20 LAB — VITAMIN B12: Vitamin B-12: 572 pg/mL (ref 232–1245)

## 2018-03-20 MED ORDER — ICOSAPENT ETHYL 1 G PO CAPS: 2.0000 g | ORAL_CAPSULE | Freq: Two times a day (BID) | ORAL | 5 refills | Status: DC

## 2018-03-24 ENCOUNTER — Encounter: Payer: Self-pay | Admitting: Physician Assistant

## 2018-03-24 ENCOUNTER — Other Ambulatory Visit: Payer: Self-pay | Admitting: Physician Assistant

## 2018-03-24 MED ORDER — PRAVASTATIN SODIUM 40 MG PO TABS
40.0000 mg | ORAL_TABLET | Freq: Every day | ORAL | 3 refills | Status: DC
Start: 2018-03-24 — End: 2019-03-19

## 2018-04-13 ENCOUNTER — Ambulatory Visit (INDEPENDENT_AMBULATORY_CARE_PROVIDER_SITE_OTHER): Payer: Medicare HMO | Admitting: *Deleted

## 2018-04-13 DIAGNOSIS — D510 Vitamin B12 deficiency anemia due to intrinsic factor deficiency: Secondary | ICD-10-CM | POA: Diagnosis not present

## 2018-04-13 DIAGNOSIS — E538 Deficiency of other specified B group vitamins: Secondary | ICD-10-CM

## 2018-04-13 NOTE — Progress Notes (Signed)
Pt given Cyanocobalamin inj Tolerated well 

## 2018-04-27 DIAGNOSIS — R69 Illness, unspecified: Secondary | ICD-10-CM | POA: Diagnosis not present

## 2018-05-09 ENCOUNTER — Other Ambulatory Visit: Payer: Self-pay | Admitting: Pediatrics

## 2018-05-14 ENCOUNTER — Ambulatory Visit (INDEPENDENT_AMBULATORY_CARE_PROVIDER_SITE_OTHER): Payer: Medicare HMO | Admitting: *Deleted

## 2018-05-14 DIAGNOSIS — D510 Vitamin B12 deficiency anemia due to intrinsic factor deficiency: Secondary | ICD-10-CM | POA: Diagnosis not present

## 2018-05-14 DIAGNOSIS — E538 Deficiency of other specified B group vitamins: Secondary | ICD-10-CM

## 2018-05-14 NOTE — Progress Notes (Signed)
Pt given Cyanocobalamin inj Tolerated well 

## 2018-05-29 ENCOUNTER — Ambulatory Visit (INDEPENDENT_AMBULATORY_CARE_PROVIDER_SITE_OTHER): Payer: Medicare HMO | Admitting: Family Medicine

## 2018-05-29 ENCOUNTER — Encounter: Payer: Self-pay | Admitting: Family Medicine

## 2018-05-29 VITALS — BP 121/72 | HR 59 | Temp 97.2°F | Wt 137.0 lb

## 2018-05-29 DIAGNOSIS — M62830 Muscle spasm of back: Secondary | ICD-10-CM | POA: Diagnosis not present

## 2018-05-29 DIAGNOSIS — E782 Mixed hyperlipidemia: Secondary | ICD-10-CM | POA: Diagnosis not present

## 2018-05-29 DIAGNOSIS — R739 Hyperglycemia, unspecified: Secondary | ICD-10-CM | POA: Diagnosis not present

## 2018-05-29 LAB — BAYER DCA HB A1C WAIVED: HB A1C (BAYER DCA - WAIVED): 5.8 % (ref <7.0)

## 2018-05-29 NOTE — Progress Notes (Signed)
Subjective: CC: back pain PCP: Terald Sleeper, PA-C IRW:ERXV Jasmin Butler is a 67 y.o. female presenting to clinic today for:  1. Back pain Patient reports a 1 day history of left-sided low back and hip pain.  She reports that it was initially severe 8/10 pain.  She took ibuprofen yesterday and it seemed to relieve it.  She describes the pain is intermittent, radiating to her left buttock.  Today the pain is a 2/10.  No weakness, numbness or tingling.  No saddle anesthesia, fecal incontinence or urinary retention.  She has a history of low back pain but it usually higher in the back.  She kept today's appointment because she wanted to get labs checked as well.    ROS: Per HPI  Allergies  Allergen Reactions  . Statins Other (See Comments) and Rash    Causes extremity pain and soreness Causes extremity pain and soreness  . Other Other (See Comments)    Cannot tolerate any nuts due to IBS  . Penicillins Rash    REACTION: rash   Past Medical History:  Diagnosis Date  . Anxiety   . Cataract   . Depression    "couple times/yr" (09/29/2014)  . GERD (gastroesophageal reflux disease) 2005  . Headache    "weekly" (09/29/2014)  . History of hiatal hernia   . Hyperlipidemia   . IBS (irritable bowel syndrome)   . Migraine    "2-3 times/yr" (09/29/2014)  . Pernicious anemia   . Pneumonia 1980's   "double"  . Thyroid disease     Current Outpatient Medications:  .  cyanocobalamin (,VITAMIN B-12,) 1000 MCG/ML injection, INJECT 1 ML INTRAMUSCULARLY EVERY 30 DAYS, Disp: , Rfl:  .  Multiple Vitamin (MULTI-VITAMINS) TABS, Take by mouth., Disp: , Rfl:  .  pravastatin (PRAVACHOL) 40 MG tablet, Take 1 tablet (40 mg total) by mouth daily., Disp: 90 tablet, Rfl: 3 .  SYNTHROID 88 MCG tablet, TAKE 1 TABLET BY MOUTH DAILY BEFORE BREAKFAST, Disp: 90 tablet, Rfl: 3  Current Facility-Administered Medications:  .  cyanocobalamin ((VITAMIN B-12)) injection 1,000 mcg, 1,000 mcg, Intramuscular, Q30  days, Eustaquio Maize, MD, 1,000 mcg at 05/14/18 4008 Social History   Socioeconomic History  . Marital status: Widowed    Spouse name: Not on file  . Number of children: 3  . Years of education: Not on file  . Highest education level: Not on file  Occupational History  . Occupation: Retired- Investment banker, corporate  . Financial resource strain: Not on file  . Food insecurity:    Worry: Not on file    Inability: Not on file  . Transportation needs:    Medical: Not on file    Non-medical: Not on file  Tobacco Use  . Smoking status: Never Smoker  . Smokeless tobacco: Never Used  Substance and Sexual Activity  . Alcohol use: No  . Drug use: No  . Sexual activity: Not Currently  Lifestyle  . Physical activity:    Days per week: Not on file    Minutes per session: Not on file  . Stress: Not on file  Relationships  . Social connections:    Talks on phone: Not on file    Gets together: Not on file    Attends religious service: Not on file    Active member of club or organization: Not on file    Attends meetings of clubs or organizations: Not on file    Relationship status: Not on file  .  Intimate partner violence:    Fear of current or ex partner: Not on file    Emotionally abused: Not on file    Physically abused: Not on file    Forced sexual activity: Not on file  Other Topics Concern  . Not on file  Social History Narrative   Lives at home with husband [name].   Family History  Problem Relation Age of Onset  . Leukemia Father   . Cancer Father   . Hypertension Mother   . Stroke Mother 27  . Thyroid disease Mother   . Arthritis Mother   . Cancer Brother        4 different cancers  . Asthma Brother   . Birth defects Son     Objective: Office vital signs reviewed. BP 121/72   Pulse (!) 59   Temp (!) 97.2 F (36.2 C)   Wt 137 lb (62.1 kg)   BMI 24.27 kg/m   Physical Examination:  General: Awake, alert, well nourished, No acute  distress Extremities: warm, well perfused, No edema, cyanosis or clubbing; +2 pulses bilaterally MSK: normal gait and normal station  Lumbar spine: Mild rotation of the L4/L5 to the left upon palpation.  She has full active range of motion in all planes.  She does have some discomfort with extension.  No midline tenderness to palpation.  She does have left-sided paraspinal muscle tenderness to palpation over the SI joint and into the left gluteus.  No palpable bony abnormalities. Skin: dry; intact; no rashes or lesions Neuro: preserved Strength and light touch sensation grossly intact  Assessment/ Plan: 67 y.o. female   1. Lumbar paraspinal muscle spasm Seems to be improving with ibuprofen.  I offered her muscle relaxer if she found that she needed this.  She declined.  I agree with just continuing use of oral NSAIDs and symptoms are improving.  Home care instructions were reviewed.  Reasons for return discussed.  Reason for emergent evaluation in the emergency department also reviewed.  Patient voiced good understanding of follow-up with PCP as needed.  2. Mixed hyperlipidemia Patient just started on Pravachol a couple of months ago.  We will recheck direct LDL - LDL Cholesterol, Direct  3. Elevated blood sugar Blood sugar mildly elevated on last BMP.  Check A1c. - Bayer DCA Hb A1c Waived   Orders Placed This Encounter  Procedures  . LDL Cholesterol, Direct  . Bayer DCA Hb A1c Waived   No orders of the defined types were placed in this encounter.    Jasmin Norlander, DO [place] [phone]

## 2018-05-30 LAB — LDL CHOLESTEROL, DIRECT: LDL DIRECT: 101 mg/dL — AB (ref 0–99)

## 2018-06-16 ENCOUNTER — Ambulatory Visit (INDEPENDENT_AMBULATORY_CARE_PROVIDER_SITE_OTHER): Payer: Medicare HMO

## 2018-06-16 DIAGNOSIS — D510 Vitamin B12 deficiency anemia due to intrinsic factor deficiency: Secondary | ICD-10-CM

## 2018-06-16 DIAGNOSIS — E538 Deficiency of other specified B group vitamins: Secondary | ICD-10-CM

## 2018-06-16 NOTE — Patient Instructions (Signed)
Cyanocobalamin, Vitamin B12 injection What is this medicine? CYANOCOBALAMIN (sye an oh koe BAL a min) is a man made form of vitamin B12. Vitamin B12 is used in the growth of healthy blood cells, nerve cells, and proteins in the body. It also helps with the metabolism of fats and carbohydrates. This medicine is used to treat people who can not absorb vitamin B12. This medicine may be used for other purposes; ask your health care provider or pharmacist if you have questions. COMMON BRAND NAME(S): B-12 Compliance Kit, B-12 Injection Kit, Cyomin, LA-12, Nutri-Twelve, Physicians EZ Use B-12, Primabalt What should I tell my health care provider before I take this medicine? They need to know if you have any of these conditions: -kidney disease -Leber's disease -megaloblastic anemia -an unusual or allergic reaction to cyanocobalamin, cobalt, other medicines, foods, dyes, or preservatives -pregnant or trying to get pregnant -breast-feeding How should I use this medicine? This medicine is injected into a muscle or deeply under the skin. It is usually given by a health care professional in a clinic or doctor's office. However, your doctor may teach you how to inject yourself. Follow all instructions. Talk to your pediatrician regarding the use of this medicine in children. Special care may be needed. Overdosage: If you think you have taken too much of this medicine contact a poison control center or emergency room at once. NOTE: This medicine is only for you. Do not share this medicine with others. What if I miss a dose? If you are given your dose at a clinic or doctor's office, call to reschedule your appointment. If you give your own injections and you miss a dose, take it as soon as you can. If it is almost time for your next dose, take only that dose. Do not take double or extra doses. What may interact with this medicine? -colchicine -heavy alcohol intake This list may not describe all possible  interactions. Give your health care provider a list of all the medicines, herbs, non-prescription drugs, or dietary supplements you use. Also tell them if you smoke, drink alcohol, or use illegal drugs. Some items may interact with your medicine. What should I watch for while using this medicine? Visit your doctor or health care professional regularly. You may need blood work done while you are taking this medicine. You may need to follow a special diet. Talk to your doctor. Limit your alcohol intake and avoid smoking to get the best benefit. What side effects may I notice from receiving this medicine? Side effects that you should report to your doctor or health care professional as soon as possible: -allergic reactions like skin rash, itching or hives, swelling of the face, lips, or tongue -blue tint to skin -chest tightness, pain -difficulty breathing, wheezing -dizziness -red, swollen painful area on the leg Side effects that usually do not require medical attention (report to your doctor or health care professional if they continue or are bothersome): -diarrhea -headache This list may not describe all possible side effects. Call your doctor for medical advice about side effects. You may report side effects to FDA at 1-800-FDA-1088. Where should I keep my medicine? Keep out of the reach of children. Store at room temperature between 15 and 30 degrees C (59 and 85 degrees F). Protect from light. Throw away any unused medicine after the expiration date. NOTE: This sheet is a summary. It may not cover all possible information. If you have questions about this medicine, talk to your doctor, pharmacist, or   health care provider.  2018 Elsevier/Gold Standard (2008-01-11 22:10:20)  

## 2018-06-16 NOTE — Progress Notes (Signed)
B12 injection to right deltoid.  Patient tolerated well.

## 2018-06-18 ENCOUNTER — Ambulatory Visit: Payer: Medicare HMO | Admitting: *Deleted

## 2018-06-19 ENCOUNTER — Ambulatory Visit (INDEPENDENT_AMBULATORY_CARE_PROVIDER_SITE_OTHER): Payer: Medicare HMO | Admitting: *Deleted

## 2018-06-19 ENCOUNTER — Encounter: Payer: Self-pay | Admitting: *Deleted

## 2018-06-19 VITALS — BP 124/75 | HR 62 | Ht 63.5 in | Wt 141.0 lb

## 2018-06-19 DIAGNOSIS — Z0000 Encounter for general adult medical examination without abnormal findings: Secondary | ICD-10-CM

## 2018-06-19 DIAGNOSIS — E039 Hypothyroidism, unspecified: Secondary | ICD-10-CM

## 2018-06-19 DIAGNOSIS — Z23 Encounter for immunization: Secondary | ICD-10-CM

## 2018-06-19 MED ORDER — LEVOTHYROXINE SODIUM 88 MCG PO TABS: 88.0000 ug | ORAL_TABLET | Freq: Every day | ORAL | 1 refills | Status: DC

## 2018-06-19 NOTE — Progress Notes (Addendum)
Subjective:   Jasmin Butler is a 67 y.o. female who presents for a Medicare Annual Wellness Visit. Jasmin Butler lives at home alone since her husband passed away in November 30, 2022 of this year. She has 3 adult children. One lives close by, one lives on the [place] side of [place] and the other lives [place]. She has 5 grandchildren. She talks and visits with her family often. She enjoys working in the yard and [employer]. She also has free range chickens and doves that she tends to. She has two house dogs that keep her company.    Review of Systems    Patient reports that her overall health is unchanged compared to last year.  Cardiac Risk Factors include: hypertension;sedentary lifestyle;advanced age (>53men, >49 women)  GI: Episodes of epigastric pain due to hiatal hernia. Denies n/v.   All other systems negative       Current Medications (verified) Outpatient Encounter Medications as of 06/19/2018  Medication Sig  . co-enzyme Q-10 30 MG capsule Take 30 mg by mouth daily.  . cyanocobalamin (,VITAMIN B-12,) 1000 MCG/ML injection INJECT 1 ML INTRAMUSCULARLY EVERY 30 DAYS  . Multiple Vitamin (MULTI-VITAMINS) TABS Take by mouth.  . pravastatin (PRAVACHOL) 40 MG tablet Take 1 tablet (40 mg total) by mouth daily.  . [DISCONTINUED] SYNTHROID 88 MCG tablet TAKE 1 TABLET BY MOUTH DAILY BEFORE BREAKFAST  . levothyroxine (SYNTHROID, LEVOTHROID) 88 MCG tablet Take 1 tablet (88 mcg total) by mouth daily.   Facility-Administered Encounter Medications as of 06/19/2018  Medication  . cyanocobalamin ((VITAMIN B-12)) injection 1,000 mcg    Allergies (verified) Statins; Other; and Penicillins   History: Past Medical History:  Diagnosis Date  . Anxiety   . Cataract   . Depression    "couple times/yr" (09/29/2014)  . GERD (gastroesophageal reflux disease) 2005  . Headache    "weekly" (09/29/2014)  . History of hiatal hernia   . Hyperlipidemia   . IBS (irritable bowel syndrome)   . Migraine      "2-3 times/yr" (09/29/2014)  . Pernicious anemia   . Pneumonia 1980's   "double"  . Thyroid disease    Past Surgical History:  Procedure Laterality Date  . ABDOMINAL HYSTERECTOMY  1985  . CARDIAC CATHETERIZATION  ~ 2005  . CATARACT EXTRACTION W/ INTRAOCULAR LENS  IMPLANT, BILATERAL Bilateral 2000's  . CHOLECYSTECTOMY OPEN  ~ 1990  . EYE SURGERY    . TUBAL LIGATION  ~ 1983   Family History  Problem Relation Age of Onset  . Leukemia Father   . Cancer Father        leukemia  . Hypertension Mother   . Stroke Mother 80  . Thyroid disease Mother   . Arthritis Mother   . Cancer Brother        4 different cancers  . Asthma Brother   . Birth defects Son    Social History   Socioeconomic History  . Marital status: Widowed    Spouse name: Not on file  . Number of children: 3  . Years of education: GED  . Highest education level: GED or equivalent  Occupational History  . Occupation: Retired- Investment banker, corporate  . Financial resource strain: Not hard at all  . Food insecurity:    Worry: Never true    Inability: Never true  . Transportation needs:    Medical: No    Non-medical: No  Tobacco Use  . Smoking status: Never Smoker  .  Smokeless tobacco: Never Used  Substance and Sexual Activity  . Alcohol use: No  . Drug use: No  . Sexual activity: Not Currently  Lifestyle  . Physical activity:    Days per week: 5 days    Minutes per session: 30 min  . Stress: Not at all  Relationships  . Social connections:    Talks on phone: More than three times a week    Gets together: More than three times a week    Attends religious service: Never    Active member of club or organization: No    Attends meetings of clubs or organizations: Never    Relationship status: Widowed  Other Topics Concern  . Not on file  Social History Narrative  . Not on file    Tobacco Use No.  Clinical Intake:  Pre-visit preparation completed: No  Pain : No/denies pain      Nutritional Status: BMI of 19-24  Normal Diabetes: No  How often do you need to have someone help you when you read instructions, pamphlets, or other written materials from your doctor or pharmacy?: 1 - Never What is the last grade level you completed in school?: GED  Interpreter Needed?: No  Information entered by :: Chong Sicilian, RN   Activities of Daily Living In your present state of health, do you have any difficulty performing the following activities: 06/19/2018  Hearing? N  Vision? N  Difficulty concentrating or making decisions? N  Walking or climbing stairs? N  Dressing or bathing? N  Doing errands, shopping? N  Preparing Food and eating ? N  Using the Toilet? N  In the past six months, have you accidently leaked urine? N  Do you have problems with loss of bowel control? N  Managing your Medications? N  Managing your Finances? N  Housekeeping or managing your Housekeeping? N  Some recent data might be hidden     Diet Prepares most meals and 2 to 3 meals a day  Exercise Current Exercise Habits: The patient does not participate in regular exercise at present, Exercise limited by: None identified   Depression Screen PHQ 2/9 Scores 05/29/2018 03/18/2018 01/16/2018 11/24/2017 11/03/2017  PHQ - 2 Score 0 0 0 0 6  PHQ- 9 Score - - - - 15     Fall Risk Fall Risk  05/29/2018 03/18/2018 01/16/2018 11/24/2017 11/03/2017  Falls in the past year? No No No No No    Safety Is the patient's home free of loose throw rugs in walkways, pet beds, electrical cords, etc?   yes      Handrails on the stairs?   yes      Adequate lighting?   yes  Patient Care Team: Theodoro Clock as PCP - General (Physician Assistant)  Hospitalizations, surgeries, and ER visits in previous 12 months No hospitalizations, ER visits, or surgeries this past year.   Objective:    Today's Vitals   06/19/18 1100  BP: 124/75  Pulse: 62  Weight: 141 lb (64 kg)  Height: 5' 3.5" (1.613 m)   Body  mass index is 24.59 kg/m.  Advanced Directives 09/29/2014 09/29/2014  Does Patient Have a Medical Advance Directive? No No  Would patient like information on creating a medical advance directive? No - patient declined information -    Hearing/Vision  No hearing or vision deficits noted during visit.  Cognitive Function: MMSE - Mini Mental State Exam 06/19/2018  Orientation to time 5  Orientation to Place 5  Registration 3  Attention/ Calculation 4  Recall 3  Language- name 2 objects 2  Language- repeat 1  Language- follow 3 step command 3  Language- read & follow direction 1  Write a sentence 1  Copy design 1  Total score 29       Normal Cognitive Function Screening: Yes    Immunizations and Health Maintenance Immunization History  Administered Date(s) Administered  . Influenza-Unspecified 08/14/2014, 08/28/2016, 07/23/2017  . Pneumococcal Conjugate-13 12/12/2015  . Pneumococcal Polysaccharide-23 02/27/2017  . Tdap 06/19/2018   Health Maintenance Due  Topic Date Due  . DEXA SCAN  08/03/2014   Health Maintenance  Topic Date Due  . DEXA SCAN  08/03/2014  . INFLUENZA VACCINE  07/27/2018 (Originally 05/14/2018)  . MAMMOGRAM  12/08/2018 (Originally 08/04/1999)  . COLONOSCOPY  06/20/2019 (Originally 08/04/1999)  . COLON CANCER SCREENING ANNUAL FOBT  11/05/2018  . TETANUS/TDAP  06/19/2028  . Hepatitis C Screening  Completed  . PNA vac Low Risk Adult  Completed        Assessment:   This is a routine wellness examination for [facility].    Plan:    Goals    . Exercise 150 min/wk Moderate Activity        Health Maintenance Recommendations: Dexa-Will consider having at next visit  Mammogram-declined [place] today  Additional Screening Recommendations: Lung: Low Dose CT Chest recommended if Age 41-80 years, 30 pack-year currently smoking OR have quit w/in 15years. Patient does not qualify. Hepatitis C Screening recommended:  no-completed  Today's Orders Orders Placed This Encounter  Procedures  . Tdap vaccine greater than or equal to 7yo IM  . TSH    Standing Status:   Future    Standing Expiration Date:   06/20/2019   Patient requested to change Synthroid to generic due to significant cost difference. Advised that there can be a difference between the effects with different brands of thyroid replacement preparations. Script sent in for levothyroxine and ordered TSH for patient to repeat in about 6 weeks. PCP will review and adjust if necessary. Patient will report any noticeable changes or side effects.    Keep f/u with Terald Sleeper, PA-C and any other specialty appointments you may have Continue current medications Move carefully to avoid falls. Aim for at least 150 minutes of moderate activity a week.  Read or work on puzzles daily Stay connected with friends and family  I have personally reviewed and noted the following in the patient's chart:   . Medical and social history . Use of alcohol, tobacco or illicit drugs  . Current medications and supplements . Functional ability and status . Nutritional status .  Marland Kitchen Physical activity . Advanced directives . List of other physicians . Hospitalizations, surgeries, and ER visits in previous 12 months . Vitals . Screenings to include cognitive, depression, and falls . Referrals and appointments  In addition, I have reviewed and discussed with patient certain preventive protocols, quality metrics, and best practice recommendations. A written personalized care plan for preventive services as well as general preventive health recommendations were provided to patient.     Chong Sicilian, RN   06/19/2018   I have reviewed and agree with the above AWV documentation.   Terald Sleeper PA-C [place] [address]  [place], [place] [address] [phone]

## 2018-06-19 NOTE — Patient Instructions (Signed)
Ms. Haseley , Thank you for taking time to come for your Medicare Wellness Visit. I appreciate your ongoing commitment to your health goals. Please review the following plan we discussed and let me know if I can assist you in the future.   This is a list of the screening recommended for you and due dates:  Health Maintenance  Topic Date Due  . DEXA scan (bone density measurement)  08/03/2014  . Flu Shot  07/27/2018*  . Mammogram  12/08/2018*  . Colon Cancer Screening  06/20/2019*  . Stool Blood Test  11/05/2018  . Tetanus Vaccine  06/19/2028  .  Hepatitis C: One time screening is recommended by Center for Disease Control  (CDC) for  adults born from [birth year] through 1967.   Completed  . Pneumonia vaccines  Completed  *Topic was postponed. The date shown is not the original due date.   Diphtheria/Tetanus Toxoids; Pertussis Vaccine, DTP injection What is this medicine? DIPHTHERIA and TETANUS TOXOIDS; PERTUSSIS VACCINE (dif THEER ee uh and TET n [place] TOK soids; per TUS iss VAK seen) is used to prevent diphtheria, tetanus, and pertussis infections. This medicine may be used for other purposes; ask your health care provider or pharmacist if you have questions. COMMON BRAND NAME(S): Adacel, Boostrix, Certiva, Daptacel, Infanrix, Tripedia What should I tell my health care provider before I take this medicine? They need to know if you have any of these conditions: -blood disorders like hemophilia -fever or infection -immune system problems -neurologic disease -seizures -an unusual or allergic reaction to vaccines, thimerosal, latex, other medicines, foods, dyes, or preservatives -pregnant or trying to get pregnant -breast-feeding How should I use this medicine? This vaccine is for injection into a muscle. It is given by a health care professional. A copy of Vaccine Information Statements will be given before each vaccination. Read this sheet carefully each time. The sheet may change  frequently. Talk to your pediatrician regarding the use of this vaccine in children. While the DTP vaccine may be given to children ages 6 weeks to 7 years and the Tdap vaccine may be given to children at least 34 years old, precautions do apply. Overdosage: If you think you have taken too much of this medicine contact a poison control center or emergency room at once. NOTE: This medicine is only for you. Do not share this medicine with others. What if I miss a dose? It is important not to miss your dose. Call your doctor or health care professional if you are unable to keep an appointment. What may interact with this medicine? -immune globulin -medicines that suppress your immune function like adalimumab, anakinra, infliximab -medicines to treat cancer -medicines that treat or prevent blood clots like warfarin, enoxaparin, and dalteparin -steroid medicines like prednisone or cortisone This list may not describe all possible interactions. Give your health care provider a list of all the medicines, herbs, non-prescription drugs, or dietary supplements you use. Also tell them if you smoke, drink alcohol, or use illegal drugs. Some items may interact with your medicine. What should I watch for while using this medicine? See your health care provider for all shots of this vaccine as directed. To have protection from infection, you must have 3 shots of this vaccine plus boosters as needed. Tell your doctor right away if you have any serious or unusual side effects after getting this vaccine. What side effects may I notice from receiving this medicine? Side effects that you should report to your doctor or health  care professional as soon as possible: -allergic reactions like skin rash, itching or hives, swelling of the face, lips, or tongue -breathing problems -fever of 103 degrees F or more -flu-like symptoms -inconsolable crying -infection -pain, tingling, numbness in the hands or  feet -seizures -swelling of arm or leg that was injected -unusually weak or tired Side effects that usually do not require medical attention (report to your doctor or health care professional if they continue or are bothersome): -fussy, irritable -loss of appetite -low-grade fever -pain, tenderness, redness, swelling, or a 'knot' at site where injected -vomiting This list may not describe all possible side effects. Call your doctor for medical advice about side effects. You may report side effects to FDA at 1-800-FDA-1088. Where should I keep my medicine? This drug is given in a hospital or clinic and will not be stored at home. NOTE: This sheet is a summary. It may not cover all possible information. If you have questions about this medicine, talk to your doctor, pharmacist, or health care provider.  2018 Elsevier/Gold Standard (2015-11-02 12:43:42)

## 2018-06-23 ENCOUNTER — Encounter: Payer: Self-pay | Admitting: Physician Assistant

## 2018-07-10 ENCOUNTER — Ambulatory Visit: Payer: Medicare HMO

## 2018-07-15 ENCOUNTER — Ambulatory Visit (INDEPENDENT_AMBULATORY_CARE_PROVIDER_SITE_OTHER): Payer: Medicare HMO | Admitting: *Deleted

## 2018-07-15 ENCOUNTER — Other Ambulatory Visit: Payer: Medicare HMO

## 2018-07-15 DIAGNOSIS — E039 Hypothyroidism, unspecified: Secondary | ICD-10-CM | POA: Diagnosis not present

## 2018-07-15 DIAGNOSIS — D510 Vitamin B12 deficiency anemia due to intrinsic factor deficiency: Secondary | ICD-10-CM

## 2018-07-15 DIAGNOSIS — E538 Deficiency of other specified B group vitamins: Secondary | ICD-10-CM

## 2018-07-15 DIAGNOSIS — Z23 Encounter for immunization: Secondary | ICD-10-CM

## 2018-07-15 NOTE — Progress Notes (Signed)
Pt given Cyanocobalamin inj Tolerated well 

## 2018-07-16 LAB — TSH: TSH: 0.536 u[IU]/mL (ref 0.450–4.500)

## 2018-08-14 ENCOUNTER — Ambulatory Visit (INDEPENDENT_AMBULATORY_CARE_PROVIDER_SITE_OTHER): Payer: Medicare HMO | Admitting: *Deleted

## 2018-08-14 DIAGNOSIS — D510 Vitamin B12 deficiency anemia due to intrinsic factor deficiency: Secondary | ICD-10-CM

## 2018-08-14 DIAGNOSIS — E538 Deficiency of other specified B group vitamins: Secondary | ICD-10-CM

## 2018-08-14 NOTE — Progress Notes (Signed)
Pt given Cyanocobalamin inj Tolerated well 

## 2018-09-14 ENCOUNTER — Ambulatory Visit (INDEPENDENT_AMBULATORY_CARE_PROVIDER_SITE_OTHER): Payer: Medicare HMO | Admitting: *Deleted

## 2018-09-14 DIAGNOSIS — E538 Deficiency of other specified B group vitamins: Secondary | ICD-10-CM

## 2018-09-14 DIAGNOSIS — D510 Vitamin B12 deficiency anemia due to intrinsic factor deficiency: Secondary | ICD-10-CM

## 2018-09-14 NOTE — Patient Instructions (Signed)
Cyanocobalamin, Vitamin B12 injection What is this medicine? CYANOCOBALAMIN (sye an oh koe BAL a min) is a man made form of vitamin B12. Vitamin B12 is used in the growth of healthy blood cells, nerve cells, and proteins in the body. It also helps with the metabolism of fats and carbohydrates. This medicine is used to treat people who can not absorb vitamin B12. This medicine may be used for other purposes; ask your health care provider or pharmacist if you have questions. COMMON BRAND NAME(S): B-12 Compliance Kit, B-12 Injection Kit, Cyomin, LA-12, Nutri-Twelve, Physicians EZ Use B-12, Primabalt What should I tell my health care provider before I take this medicine? They need to know if you have any of these conditions: -kidney disease -Leber's disease -megaloblastic anemia -an unusual or allergic reaction to cyanocobalamin, cobalt, other medicines, foods, dyes, or preservatives -pregnant or trying to get pregnant -breast-feeding How should I use this medicine? This medicine is injected into a muscle or deeply under the skin. It is usually given by a health care professional in a clinic or doctor's office. However, your doctor may teach you how to inject yourself. Follow all instructions. Talk to your pediatrician regarding the use of this medicine in children. Special care may be needed. Overdosage: If you think you have taken too much of this medicine contact a poison control center or emergency room at once. NOTE: This medicine is only for you. Do not share this medicine with others. What if I miss a dose? If you are given your dose at a clinic or doctor's office, call to reschedule your appointment. If you give your own injections and you miss a dose, take it as soon as you can. If it is almost time for your next dose, take only that dose. Do not take double or extra doses. What may interact with this medicine? -colchicine -heavy alcohol intake This list may not describe all possible  interactions. Give your health care provider a list of all the medicines, herbs, non-prescription drugs, or dietary supplements you use. Also tell them if you smoke, drink alcohol, or use illegal drugs. Some items may interact with your medicine. What should I watch for while using this medicine? Visit your doctor or health care professional regularly. You may need blood work done while you are taking this medicine. You may need to follow a special diet. Talk to your doctor. Limit your alcohol intake and avoid smoking to get the best benefit. What side effects may I notice from receiving this medicine? Side effects that you should report to your doctor or health care professional as soon as possible: -allergic reactions like skin rash, itching or hives, swelling of the face, lips, or tongue -blue tint to skin -chest tightness, pain -difficulty breathing, wheezing -dizziness -red, swollen painful area on the leg Side effects that usually do not require medical attention (report to your doctor or health care professional if they continue or are bothersome): -diarrhea -headache This list may not describe all possible side effects. Call your doctor for medical advice about side effects. You may report side effects to FDA at 1-800-FDA-1088. Where should I keep my medicine? Keep out of the reach of children. Store at room temperature between 15 and 30 degrees C (59 and 85 degrees F). Protect from light. Throw away any unused medicine after the expiration date. NOTE: This sheet is a summary. It may not cover all possible information. If you have questions about this medicine, talk to your doctor, pharmacist, or   health care provider.  2018 Elsevier/Gold Standard (2008-01-11 22:10:20)  

## 2018-09-14 NOTE — Progress Notes (Signed)
Vitamin b12 injection given and patient tolerated well.  

## 2018-09-29 ENCOUNTER — Encounter: Payer: Self-pay | Admitting: Family Medicine

## 2018-09-29 ENCOUNTER — Ambulatory Visit (INDEPENDENT_AMBULATORY_CARE_PROVIDER_SITE_OTHER): Payer: Medicare HMO | Admitting: Family Medicine

## 2018-09-29 VITALS — BP 113/73 | HR 73 | Temp 98.1°F | Ht 63.5 in | Wt 147.0 lb

## 2018-09-29 DIAGNOSIS — J069 Acute upper respiratory infection, unspecified: Secondary | ICD-10-CM | POA: Diagnosis not present

## 2018-09-29 MED ORDER — GUAIFENESIN-CODEINE 100-10 MG/5ML PO SOLN: 5.0000 mL | Freq: Four times a day (QID) | ORAL | 0 refills | Status: DC | PRN

## 2018-09-29 NOTE — Patient Instructions (Signed)

## 2018-09-29 NOTE — Progress Notes (Signed)
Subjective: CC: URI PCP: Terald Sleeper, PA-C DTO:IZTI Jasmin Butler is a 66 y.o. female presenting to clinic today for:  1. Cold symptoms  Patient reports rhinorrhea, productive cough, malaise and headaches that started Saturday.  Denies hemoptysis, congestion, SOB, dizziness, rash, nausea, vomiting, diarrhea, fevers, chills, myalgia. Patient has used Mucinex, Tylenol and OTC cough medication with no relief of symptoms.  Denies history of COPD or asthma.  Denies tobacco use/ exposure.    ROS: Per HPI  Allergies  Allergen Reactions  . Other Other (See Comments)    Cannot tolerate any nuts due to IBS  . Penicillins Rash    REACTION: rash   Past Medical History:  Diagnosis Date  . Anxiety   . Cataract   . Depression    "couple times/yr" (09/29/2014)  . GERD (gastroesophageal reflux disease) 2005  . Headache    "weekly" (09/29/2014)  . History of hiatal hernia   . Hyperlipidemia   . IBS (irritable bowel syndrome)   . Migraine    "2-3 times/yr" (09/29/2014)  . Pernicious anemia   . Pneumonia 1980's   "double"  . Thyroid disease     Current Outpatient Medications:  .  co-enzyme Q-10 30 MG capsule, Take 30 mg by mouth daily., Disp: , Rfl:  .  cyanocobalamin (,VITAMIN B-12,) 1000 MCG/ML injection, INJECT 1 ML INTRAMUSCULARLY EVERY 30 DAYS, Disp: , Rfl:  .  levothyroxine (SYNTHROID, LEVOTHROID) 88 MCG tablet, Take 1 tablet (88 mcg total) by mouth daily., Disp: 90 tablet, Rfl: 1 .  Multiple Vitamin (MULTI-VITAMINS) TABS, Take by mouth., Disp: , Rfl:  .  pravastatin (PRAVACHOL) 40 MG tablet, Take 1 tablet (40 mg total) by mouth daily., Disp: 90 tablet, Rfl: 3  Current Facility-Administered Medications:  .  cyanocobalamin ((VITAMIN B-12)) injection 1,000 mcg, 1,000 mcg, Intramuscular, Q30 days, Eustaquio Maize, MD, 1,000 mcg at 09/14/18 4580 Social History   Socioeconomic History  . Marital status: Widowed    Spouse name: Not on file  . Number of children: 3  . Years of  education: GED  . Highest education level: GED or equivalent  Occupational History  . Occupation: Retired- Investment banker, corporate  . Financial resource strain: Not hard at all  . Food insecurity:    Worry: Never true    Inability: Never true  . Transportation needs:    Medical: No    Non-medical: No  Tobacco Use  . Smoking status: Never Smoker  . Smokeless tobacco: Never Used  Substance and Sexual Activity  . Alcohol use: No  . Drug use: No  . Sexual activity: Not Currently  Lifestyle  . Physical activity:    Days per week: 5 days    Minutes per session: 30 min  . Stress: Not at all  Relationships  . Social connections:    Talks on phone: More than three times a week    Gets together: More than three times a week    Attends religious service: Never    Active member of club or organization: No    Attends meetings of clubs or organizations: Never    Relationship status: Widowed  . Intimate partner violence:    Fear of current or ex partner: Not on file    Emotionally abused: Not on file    Physically abused: Not on file    Forced sexual activity: Not on file  Other Topics Concern  . Not on file  Social History Narrative  . Not on file  Family History  Problem Relation Age of Onset  . Leukemia Father   . Cancer Father        leukemia  . Hypertension Mother   . Stroke Mother 26  . Thyroid disease Mother   . Arthritis Mother   . Cancer Brother        4 different cancers  . Asthma Brother   . Birth defects Son     Objective: Office vital signs reviewed. BP 113/73   Pulse 73   Temp 98.1 F (36.7 C) (Oral)   Ht 5' 3.5" (1.613 m)   Wt 147 lb (66.7 kg)   SpO2 97%   BMI 25.63 kg/m   Physical Examination:  General: Awake, alert, well nourished, No acute distress HEENT: Normal    Neck: No masses palpated. No lymphadenopathy    Ears: Tympanic membranes intact, normal light reflex, no erythema, no bulging    Eyes: PERRLA, extraocular membranes intact,  sclera white    Nose: nasal turbinates moist, clear nasal discharge    Throat: moist mucus membranes, no erythema, no tonsillar exudate.  Airway is patent Cardio: regular rate and rhythm, S1S2 heard, no murmurs appreciated Pulm: clear to auscultation bilaterally, no wheezes, rhonchi or rales; normal work of breathing on room air  Assessment/ Plan: 67 y.o. female   1. URI with cough and congestion Likely viral.  No evidence of bacterial infection on exam.  Patient is afebrile with normal vital signs.  She has normal pulse ox on room air with normal work of breathing.  Physical exam unremarkable.  I have recommended supportive care and prescribed her cough medication containing codeine.  Caution sedation.  She will not operate heavy machinery while taking.  Home care instructions reviewed and handout was provided.  Reasons return discussed.  Patient voiced good understanding of follow-up PRN.   No orders of the defined types were placed in this encounter.  Meds ordered this encounter  Medications  . guaiFENesin-codeine 100-10 MG/5ML syrup    Sig: Take 5 mLs by mouth every 6 (six) hours as needed for cough.    Dispense:  120 mL    Refill:  0   The Narcotic Database has been reviewed.  There were no red flags.    [name], DO [place] [phone]

## 2018-10-15 ENCOUNTER — Ambulatory Visit (INDEPENDENT_AMBULATORY_CARE_PROVIDER_SITE_OTHER): Payer: Medicare HMO | Admitting: *Deleted

## 2018-10-15 DIAGNOSIS — D510 Vitamin B12 deficiency anemia due to intrinsic factor deficiency: Secondary | ICD-10-CM | POA: Diagnosis not present

## 2018-10-15 DIAGNOSIS — E538 Deficiency of other specified B group vitamins: Secondary | ICD-10-CM

## 2018-10-15 NOTE — Progress Notes (Signed)
Pt given Cyanocobalamin inj Tolerated well 

## 2018-11-04 DIAGNOSIS — R69 Illness, unspecified: Secondary | ICD-10-CM | POA: Diagnosis not present

## 2018-11-16 ENCOUNTER — Ambulatory Visit (INDEPENDENT_AMBULATORY_CARE_PROVIDER_SITE_OTHER): Payer: Medicare HMO | Admitting: *Deleted

## 2018-11-16 DIAGNOSIS — D510 Vitamin B12 deficiency anemia due to intrinsic factor deficiency: Secondary | ICD-10-CM

## 2018-11-16 DIAGNOSIS — E538 Deficiency of other specified B group vitamins: Secondary | ICD-10-CM

## 2018-11-16 NOTE — Progress Notes (Signed)
Pt given cyanocobalamin inj Tolerated well 

## 2018-11-18 ENCOUNTER — Telehealth: Payer: Self-pay | Admitting: Physician Assistant

## 2018-11-18 NOTE — Telephone Encounter (Signed)
Patient states that she has been experiencing weakness off and on for the last year or more.  Usually feels weak when thyroid functions are off.  Appt made to see Particia Nearing, PA for 4 month follow up and thyroid check

## 2018-11-24 ENCOUNTER — Encounter: Payer: Self-pay | Admitting: Physician Assistant

## 2018-11-24 ENCOUNTER — Ambulatory Visit (INDEPENDENT_AMBULATORY_CARE_PROVIDER_SITE_OTHER): Payer: Medicare HMO | Admitting: Physician Assistant

## 2018-11-24 VITALS — BP 130/81 | HR 61 | Temp 97.5°F | Ht 63.5 in | Wt 146.8 lb

## 2018-11-24 DIAGNOSIS — R5382 Chronic fatigue, unspecified: Secondary | ICD-10-CM

## 2018-11-24 DIAGNOSIS — Z1211 Encounter for screening for malignant neoplasm of colon: Secondary | ICD-10-CM

## 2018-11-24 DIAGNOSIS — E039 Hypothyroidism, unspecified: Secondary | ICD-10-CM

## 2018-11-25 LAB — CMP14+EGFR
ALT: 14 IU/L (ref 0–32)
AST: 20 IU/L (ref 0–40)
Albumin/Globulin Ratio: 1.9 (ref 1.2–2.2)
Albumin: 4.5 g/dL (ref 3.8–4.8)
Alkaline Phosphatase: 72 IU/L (ref 39–117)
BUN/Creatinine Ratio: 12 (ref 12–28)
BUN: 8 mg/dL (ref 8–27)
Bilirubin Total: 0.2 mg/dL (ref 0.0–1.2)
CALCIUM: 9.5 mg/dL (ref 8.7–10.3)
CO2: 25 mmol/L (ref 20–29)
CREATININE: 0.68 mg/dL (ref 0.57–1.00)
Chloride: 99 mmol/L (ref 96–106)
GFR, EST AFRICAN AMERICAN: 103 mL/min/{1.73_m2} (ref >59)
GFR, EST NON AFRICAN AMERICAN: 90 mL/min/{1.73_m2} (ref >59)
Globulin, Total: 2.4 g/dL (ref 1.5–4.5)
Glucose: 87 mg/dL (ref 65–99)
POTASSIUM: 4.8 mmol/L (ref 3.5–5.2)
Sodium: 140 mmol/L (ref 134–144)
TOTAL PROTEIN: 6.9 g/dL (ref 6.0–8.5)

## 2018-11-25 LAB — CBC WITH DIFFERENTIAL/PLATELET
BASOS: 2 %
Basophils Absolute: 0.1 10*3/uL (ref 0.0–0.2)
EOS (ABSOLUTE): 0.1 10*3/uL (ref 0.0–0.4)
EOS: 2 %
Hematocrit: 36.7 % (ref 34.0–46.6)
Hemoglobin: 11.9 g/dL (ref 11.1–15.9)
IMMATURE GRANS (ABS): 0 10*3/uL (ref 0.0–0.1)
IMMATURE GRANULOCYTES: 0 %
LYMPHS: 35 %
Lymphocytes Absolute: 2.4 10*3/uL (ref 0.7–3.1)
MCH: 27.4 pg (ref 26.6–33.0)
MCHC: 32.4 g/dL (ref 31.5–35.7)
MCV: 84 fL (ref 79–97)
MONOS ABS: 0.5 10*3/uL (ref 0.1–0.9)
Monocytes: 7 %
NEUTROS PCT: 54 %
Neutrophils Absolute: 3.8 10*3/uL (ref 1.4–7.0)
PLATELETS: 270 10*3/uL (ref 150–450)
RBC: 4.35 x10E6/uL (ref 3.77–5.28)
RDW: 13.2 % (ref 11.7–15.4)
WBC: 6.9 10*3/uL (ref 3.4–10.8)

## 2018-11-25 LAB — THYROID PANEL WITH TSH
FREE THYROXINE INDEX: 2 (ref 1.2–4.9)
T3 UPTAKE RATIO: 27 % (ref 24–39)
T4 TOTAL: 7.4 ug/dL (ref 4.5–12.0)
TSH: 1.67 u[IU]/mL (ref 0.450–4.500)

## 2018-11-26 DIAGNOSIS — E039 Hypothyroidism, unspecified: Secondary | ICD-10-CM | POA: Insufficient documentation

## 2018-11-26 NOTE — Progress Notes (Signed)
BP 130/81   Pulse 61   Temp (!) 97.5 F (36.4 C) (Oral)   Ht 5' 3.5" (1.613 m)   Wt 146 lb 12.8 oz (66.6 kg)   BMI 25.60 kg/m    Subjective:    Patient ID: Jasmin Butler, female    DOB: 05/14/1951, 68 y.o.   MRN: [id number]  HPI: Jasmin Butler is a 68 y.o. female presenting on 11/24/2018 for Hyperlipidemia; Fatigue; and Anemia  This patient comes in for periodic recheck on medications and conditions including hypothyroidism, fatigue, need for colon cancer screening.  She reports that she is doing well overall, just more fatigue. She know that she needs labs performed soon   All medications are reviewed today. There are no reports of any problems with the medications. All of the medical conditions are reviewed and updated.  Lab work is reviewed and will be ordered as medically necessary. There are no new problems reported with today's visit.   Past Medical History:  Diagnosis Date  . Anxiety   . Cataract   . Depression    "couple times/yr" (09/29/2014)  . GERD (gastroesophageal reflux disease) 2005  . Headache    "weekly" (09/29/2014)  . History of hiatal hernia   . Hyperlipidemia   . IBS (irritable bowel syndrome)   . Migraine    "2-3 times/yr" (09/29/2014)  . Pernicious anemia   . Pneumonia 1980's   "double"  . Thyroid disease    Relevant past medical, surgical, family and social history reviewed and updated as indicated. Interim medical history since our last visit reviewed. Allergies and medications reviewed and updated. DATA REVIEWED: CHART IN EPIC  Family History reviewed for pertinent findings.  Review of Systems  Constitutional: Negative.  Negative for activity change, fatigue and fever.  HENT: Negative.   Eyes: Negative.   Respiratory: Negative.  Negative for cough.   Cardiovascular: Negative.  Negative for chest pain.  Gastrointestinal: Negative.  Negative for abdominal pain.  Endocrine: Negative.   Genitourinary: Negative.  Negative for dysuria.    Musculoskeletal: Negative.   Skin: Negative.   Neurological: Negative.     Allergies as of 11/24/2018      Reactions   Other Other (See Comments)   Cannot tolerate any nuts due to IBS   Penicillins Rash   REACTION: rash      Medication List       Accurate as of November 24, 2018 11:59 PM. Always use your most recent med list.        co-enzyme Q-10 30 MG capsule Take 30 mg by mouth daily.   cyanocobalamin 1000 MCG/ML injection Commonly known as:  (VITAMIN B-12) INJECT 1 ML INTRAMUSCULARLY EVERY 30 DAYS   levothyroxine 88 MCG tablet Commonly known as:  SYNTHROID, LEVOTHROID Take 1 tablet (88 mcg total) by mouth daily.   MULTI-VITAMINS Tabs Take by mouth.   pravastatin 40 MG tablet Commonly known as:  PRAVACHOL Take 1 tablet (40 mg total) by mouth daily.          Objective:    BP 130/81   Pulse 61   Temp (!) 97.5 F (36.4 C) (Oral)   Ht 5' 3.5" (1.613 m)   Wt 146 lb 12.8 oz (66.6 kg)   BMI 25.60 kg/m   Allergies  Allergen Reactions  . Other Other (See Comments)    Cannot tolerate any nuts due to IBS  . Penicillins Rash    REACTION: rash    Wt Readings from Last 3  Encounters:  11/24/18 146 lb 12.8 oz (66.6 kg)  09/29/18 147 lb (66.7 kg)  06/19/18 141 lb (64 kg)    Physical Exam Constitutional:      Appearance: She is well-developed.  HENT:     Head: Normocephalic and atraumatic.  Eyes:     Conjunctiva/sclera: Conjunctivae normal.     Pupils: Pupils are equal, round, and reactive to light.  Cardiovascular:     Rate and Rhythm: Normal rate and regular rhythm.     Heart sounds: Normal heart sounds.  Pulmonary:     Effort: Pulmonary effort is normal.     Breath sounds: Normal breath sounds.  Abdominal:     General: Bowel sounds are normal.     Palpations: Abdomen is soft.  Skin:    General: Skin is warm and dry.     Findings: No rash.  Neurological:     Mental Status: She is alert and oriented to person, place, and time.     Deep Tendon  Reflexes: Reflexes are normal and symmetric.  Psychiatric:        Behavior: Behavior normal.        Thought Content: Thought content normal.        Judgment: Judgment normal.     Results for orders placed or performed in visit on 11/24/18  CBC with Differential/Platelet  Result Value Ref Range   WBC 6.9 3.4 - 10.8 x10E3/uL   RBC 4.35 3.77 - 5.28 x10E6/uL   Hemoglobin 11.9 11.1 - 15.9 g/dL   Hematocrit 36.7 34.0 - 46.6 %   MCV 84 79 - 97 fL   MCH 27.4 26.6 - 33.0 pg   MCHC 32.4 31.5 - 35.7 g/dL   RDW 13.2 11.7 - 15.4 %   Platelets 270 150 - 450 x10E3/uL   Neutrophils 54 Not Estab. %   Lymphs 35 Not Estab. %   Monocytes 7 Not Estab. %   Eos 2 Not Estab. %   Basos 2 Not Estab. %   Neutrophils Absolute 3.8 1.4 - 7.0 x10E3/uL   Lymphocytes Absolute 2.4 0.7 - 3.1 x10E3/uL   Monocytes Absolute 0.5 0.1 - 0.9 x10E3/uL   EOS (ABSOLUTE) 0.1 0.0 - 0.4 x10E3/uL   Basophils Absolute 0.1 0.0 - 0.2 x10E3/uL   Immature Granulocytes 0 Not Estab. %   Immature Grans (Abs) 0.0 0.0 - 0.1 x10E3/uL  CMP14+EGFR  Result Value Ref Range   Glucose 87 65 - 99 mg/dL   BUN 8 8 - 27 mg/dL   Creatinine, Ser 0.68 0.57 - 1.00 mg/dL   GFR calc non Af Amer 90 >59 mL/min/1.73   GFR calc Af Amer 103 >59 mL/min/1.73   BUN/Creatinine Ratio 12 12 - 28   Sodium 140 134 - 144 mmol/L   Potassium 4.8 3.5 - 5.2 mmol/L   Chloride 99 96 - 106 mmol/L   CO2 25 20 - 29 mmol/L   Calcium 9.5 8.7 - 10.3 mg/dL   Total Protein 6.9 6.0 - 8.5 g/dL   Albumin 4.5 3.8 - 4.8 g/dL   Globulin, Total 2.4 1.5 - 4.5 g/dL   Albumin/Globulin Ratio 1.9 1.2 - 2.2   Bilirubin Total 0.2 0.0 - 1.2 mg/dL   Alkaline Phosphatase 72 39 - 117 IU/L   AST 20 0 - 40 IU/L   ALT 14 0 - 32 IU/L  Thyroid Panel With TSH  Result Value Ref Range   TSH 1.670 0.450 - 4.500 uIU/mL   T4, Total 7.4 4.5 - 12.0 ug/dL  T3 Uptake Ratio 27 24 - 39 %   Free Thyroxine Index 2.0 1.2 - 4.9      Assessment & Plan:   1. Hypothyroidism, unspecified type -  CBC with Differential/Platelet - CMP14+EGFR - Thyroid Panel With TSH  2. Chronic fatigue - CBC with Differential/Platelet - CMP14+EGFR - Thyroid Panel With TSH  3. Screening for colon cancer - Cologuard   Continue all other maintenance medications as listed above.  Follow up plan: No follow-ups on file.  Educational handout given for Marlin PA-C [facility] [address]  [place], [place] [address] [phone]   11/26/2018, 8:13 PM

## 2018-11-29 ENCOUNTER — Encounter: Payer: Self-pay | Admitting: Physician Assistant

## 2018-11-29 ENCOUNTER — Emergency Department (HOSPITAL_COMMUNITY)
Admission: EM | Admit: 2018-11-29 | Discharge: 2018-11-29 | Disposition: A | Discharge status: Home / Self Care | Payer: Medicare HMO | Attending: Emergency Medicine | Admitting: Emergency Medicine

## 2018-11-29 ENCOUNTER — Emergency Department (HOSPITAL_COMMUNITY): Payer: Medicare HMO

## 2018-11-29 ENCOUNTER — Encounter (HOSPITAL_COMMUNITY): Payer: Self-pay

## 2018-11-29 DIAGNOSIS — E785 Hyperlipidemia, unspecified: Secondary | ICD-10-CM | POA: Insufficient documentation

## 2018-11-29 DIAGNOSIS — Z79899 Other long term (current) drug therapy: Secondary | ICD-10-CM | POA: Insufficient documentation

## 2018-11-29 DIAGNOSIS — R079 Chest pain, unspecified: Secondary | ICD-10-CM | POA: Diagnosis not present

## 2018-11-29 LAB — BASIC METABOLIC PANEL
Anion gap: 5 (ref 5–15)
BUN: 9 mg/dL (ref 8–23)
CO2: 30 mmol/L (ref 22–32)
Calcium: 9.5 mg/dL (ref 8.9–10.3)
Chloride: 103 mmol/L (ref 98–111)
Creatinine, Ser: 0.61 mg/dL (ref 0.44–1.00)
GFR calc Af Amer: >60 mL/min (ref >60)
GFR calc non Af Amer: >60 mL/min (ref >60)
Glucose, Bld: 101 mg/dL — ABNORMAL HIGH (ref 70–99)
Potassium: 3.7 mmol/L (ref 3.5–5.1)
SODIUM: 138 mmol/L (ref 135–145)

## 2018-11-29 LAB — I-STAT TROPONIN, ED: Troponin i, poc: 0 ng/mL (ref 0.00–0.08)

## 2018-11-29 LAB — CBC
HCT: 41.5 % (ref 36.0–46.0)
Hemoglobin: 12.8 g/dL (ref 12.0–15.0)
MCH: 26.5 pg (ref 26.0–34.0)
MCHC: 30.8 g/dL (ref 30.0–36.0)
MCV: 85.9 fL (ref 80.0–100.0)
PLATELETS: 278 10*3/uL (ref 150–400)
RBC: 4.83 MIL/uL (ref 3.87–5.11)
RDW: 13.4 % (ref 11.5–15.5)
WBC: 7.2 10*3/uL (ref 4.0–10.5)
nRBC: 0 % (ref 0.0–0.2)

## 2018-11-29 MED ORDER — SODIUM CHLORIDE 0.9% FLUSH: 3.0000 mL | Freq: Once | INTRAVENOUS | Status: DC

## 2018-11-29 NOTE — ED Triage Notes (Signed)
Onset 6:30am pt woke up with left of mid chest pain.  Pain does not radiate.  No other symptoms associated with chest pain.  Pain comes every one  Hour and stabbing pain for few seconds.  No chest pain at this time.

## 2018-11-29 NOTE — ED Notes (Signed)
Chest pain since 0600am today  No chest pain at present no sob or nausea associated with the chest pain.  No history

## 2018-11-29 NOTE — ED Provider Notes (Signed)
[facility] Provider Note   CSN: [id number] Arrival date & time: 11/29/18  1412     History   Chief Complaint Chief Complaint  Patient presents with  . Chest Pain    HPI Jasmin Butler is a 68 y.o. female.  HPI  Patient presents with sharp chest pain.  Left anterior chest.  Comes and goes.  Will come on about every hour and last for just a second or 2.  Not worse with breathing.  Now worse with movements.  States she did have a fall yesterday where she fell forward chasing after her dog and her chickens.  No extremity pain.  No numbness or weakness.  Has had previous heart catheterization that did not show any abnormality.  No abdominal pain.  No fevers or chills.  No cough.  No shortness of breath.  No swelling in the legs.  Past Medical History:  Diagnosis Date  . Anxiety   . Cataract   . Depression    "couple times/yr" (09/29/2014)  . GERD (gastroesophageal reflux disease) 2005  . Headache    "weekly" (09/29/2014)  . History of hiatal hernia   . Hyperlipidemia   . IBS (irritable bowel syndrome)   . Migraine    "2-3 times/yr" (09/29/2014)  . Pernicious anemia   . Pneumonia 1980's   "double"  . Thyroid disease     Patient Active Problem List   Diagnosis Date Noted  . Hypothyroidism 11/26/2018  . Pernicious anemia 11/03/2017  . Dysphagia 01/12/2016  . Multiple thyroid nodules 03/17/2015  . Dyspnea 10/27/2014  . Pulmonary infiltrates 10/27/2014  . Thyroid nodule 10/27/2014  . Chest pain   . Hyperlipidemia 02/29/2008  . ANEMIA 02/29/2008  . DEPRESSION 02/29/2008  . ARTHRITIS 02/29/2008    Past Surgical History:  Procedure Laterality Date  . ABDOMINAL HYSTERECTOMY  1985  . CARDIAC CATHETERIZATION  ~ 2005  . CATARACT EXTRACTION W/ INTRAOCULAR LENS  IMPLANT, BILATERAL Bilateral 2000's  . CHOLECYSTECTOMY OPEN  ~ 1990  . EYE SURGERY    . TUBAL LIGATION  ~ 1983     OB History   No obstetric history on file.       Home Medications    Prior to Admission medications   Medication Sig Start Date End Date Taking? Authorizing Provider  co-enzyme Q-10 30 MG capsule Take 30 mg by mouth daily.   Yes [provider]  cyanocobalamin (,VITAMIN B-12,) 1000 MCG/ML injection Inject 1,000 mcg into the muscle every 30 (thirty) days.  11/09/14  Yes [provider]  levothyroxine (SYNTHROID, LEVOTHROID) 88 MCG tablet Take 1 tablet (88 mcg total) by mouth daily. 06/19/18  Yes Terald Sleeper, PA-C  pravastatin (PRAVACHOL) 40 MG tablet Take 1 tablet (40 mg total) by mouth daily. 03/24/18  Yes Terald Sleeper, PA-C    Family History Family History  Problem Relation Age of Onset  . Leukemia Father   . Cancer Father        leukemia  . Hypertension Mother   . Stroke Mother 50  . Thyroid disease Mother   . Arthritis Mother   . Cancer Brother        4 different cancers  . Asthma Brother   . Birth defects Son     Social History Social History   Tobacco Use  . Smoking status: Never Smoker  . Smokeless tobacco: Never Used  Substance Use Topics  . Alcohol use: No  . Drug use: No  Allergies   Penicillins   Review of Systems Review of Systems  Constitutional: Negative for appetite change.  HENT: Negative for congestion.   Respiratory: Negative for shortness of breath.   Cardiovascular: Positive for chest pain.  Gastrointestinal: Negative for abdominal pain.  Musculoskeletal: Negative for back pain.  Skin: Negative for rash.  Neurological: Negative for weakness.  Psychiatric/Behavioral: Negative for confusion.     Physical Exam Updated Vital Signs BP 122/68   Pulse 65   Temp 98.2 F (36.8 C) (Oral)   Resp 16   SpO2 96%   Physical Exam Vitals signs and nursing note reviewed.  HENT:     Head: Normocephalic.  Neck:     Musculoskeletal: Neck supple.  Cardiovascular:     Rate and Rhythm: Regular rhythm.  Pulmonary:     Breath sounds: Normal breath sounds.      Comments: Some tenderness to left parasternal area.  No rash. Abdominal:     Palpations: Abdomen is soft.  Musculoskeletal: Normal range of motion.  Skin:    General: Skin is warm.     Capillary Refill: Capillary refill takes less than 2 seconds.  Neurological:     Mental Status: She is alert.  Psychiatric:        Mood and Affect: Mood normal.      ED Treatments / Results  Labs (all labs ordered are listed, but only abnormal results are displayed) Labs Reviewed  BASIC METABOLIC PANEL - Abnormal; Notable for the following components:      Result Value   Glucose, Bld 101 (*)    All other components within normal limits  CBC  I-STAT TROPONIN, ED    EKG EKG Interpretation  Date/Time:  Sunday November 29 2018 14:50:27 EST Ventricular Rate:  65 PR Interval:    QRS Duration: 88 QT Interval:  394 QTC Calculation: 410 R Axis:   -26 Text Interpretation:  Sinus rhythm Left ventricular hypertrophy Confirmed by Davonna Belling [phone]) on 11/29/2018 3:05:52 PM   Radiology Dg Chest 2 View  Result Date: 11/29/2018 CLINICAL DATA:  Central chest pain. EXAM: CHEST - 2 VIEW COMPARISON:  None. FINDINGS: The heart size and mediastinal contours are within normal limits. Both lungs are clear. The visualized skeletal structures are unremarkable. IMPRESSION: No active cardiopulmonary disease. Electronically Signed   By: Dorise Bullion III M.D   On: 11/29/2018 15:23    Procedures Procedures (including critical care time)  Medications Ordered in ED Medications  sodium chloride flush (NS) 0.9 % injection 3 mL (3 mLs Intravenous Not Given 11/29/18 1507)     Initial Impression / Assessment and Plan / ED Course  I have reviewed the triage vital signs and the nursing notes.  Pertinent labs & imaging results that were available during my care of the patient were reviewed by me and considered in my medical decision making (see chart for details).     Patient with sharp left-sided chest  pain.  Will last a second or 2 and comes and goes.  Not with exertion.  I think overall low cardiac risk.  EKG reassuring.  Enzymes negative.  Somewhat reproducible.  Most likely chest wall pain.  Doubt pulmonary medicine.  Discharge home.  Final Clinical Impressions(s) / ED Diagnoses   Final diagnoses:  Nonspecific chest pain    ED Discharge Orders    None       Davonna Belling, MD 11/29/18 [phone]

## 2018-11-29 NOTE — ED Notes (Signed)
Pt. returned from XR. 

## 2018-11-30 ENCOUNTER — Telehealth: Payer: Self-pay | Admitting: *Deleted

## 2018-11-30 NOTE — Telephone Encounter (Signed)
Called pt - (regaurding her email about chest pains) She went to ER yesterday - had ekg and labs - Er Dr states that he thinks pain was from a fall she had the day before. -today - she is already feeling better.

## 2018-12-03 DIAGNOSIS — Z1212 Encounter for screening for malignant neoplasm of rectum: Secondary | ICD-10-CM | POA: Diagnosis not present

## 2018-12-03 DIAGNOSIS — Z1211 Encounter for screening for malignant neoplasm of colon: Secondary | ICD-10-CM | POA: Diagnosis not present

## 2018-12-11 LAB — COLOGUARD: Cologuard: NEGATIVE

## 2018-12-14 ENCOUNTER — Ambulatory Visit (INDEPENDENT_AMBULATORY_CARE_PROVIDER_SITE_OTHER): Payer: Medicare HMO | Admitting: *Deleted

## 2018-12-14 DIAGNOSIS — E538 Deficiency of other specified B group vitamins: Secondary | ICD-10-CM

## 2018-12-14 DIAGNOSIS — D510 Vitamin B12 deficiency anemia due to intrinsic factor deficiency: Secondary | ICD-10-CM | POA: Diagnosis not present

## 2018-12-14 NOTE — Progress Notes (Signed)
Pt given Cyanocobalamin inj Tolerated well 

## 2019-01-11 ENCOUNTER — Encounter: Payer: Self-pay | Admitting: Physician Assistant

## 2019-01-14 ENCOUNTER — Other Ambulatory Visit: Payer: Self-pay

## 2019-01-14 ENCOUNTER — Ambulatory Visit (INDEPENDENT_AMBULATORY_CARE_PROVIDER_SITE_OTHER): Payer: Medicare HMO | Admitting: *Deleted

## 2019-01-14 DIAGNOSIS — E538 Deficiency of other specified B group vitamins: Secondary | ICD-10-CM

## 2019-01-14 DIAGNOSIS — D510 Vitamin B12 deficiency anemia due to intrinsic factor deficiency: Secondary | ICD-10-CM | POA: Diagnosis not present

## 2019-01-14 NOTE — Progress Notes (Signed)
Pt given Cyanocobalamin inj Tolerated well 

## 2019-02-02 ENCOUNTER — Encounter: Payer: Self-pay | Admitting: Physician Assistant

## 2019-02-02 ENCOUNTER — Other Ambulatory Visit: Payer: Self-pay | Admitting: Physician Assistant

## 2019-02-02 MED ORDER — DOXYCYCLINE HYCLATE 100 MG PO TABS: 100.0000 mg | ORAL_TABLET | Freq: Two times a day (BID) | ORAL | 0 refills | Status: DC

## 2019-02-02 NOTE — Progress Notes (Signed)
doxycy

## 2019-02-12 ENCOUNTER — Ambulatory Visit (INDEPENDENT_AMBULATORY_CARE_PROVIDER_SITE_OTHER): Payer: Medicare HMO | Admitting: *Deleted

## 2019-02-12 ENCOUNTER — Other Ambulatory Visit: Payer: Self-pay

## 2019-02-12 DIAGNOSIS — D510 Vitamin B12 deficiency anemia due to intrinsic factor deficiency: Secondary | ICD-10-CM

## 2019-02-12 DIAGNOSIS — E538 Deficiency of other specified B group vitamins: Secondary | ICD-10-CM

## 2019-02-12 NOTE — Progress Notes (Signed)
Pt given Cyanocobalamin inj Tolerated well 

## 2019-02-19 ENCOUNTER — Encounter: Payer: Self-pay | Admitting: Physician Assistant

## 2019-02-23 ENCOUNTER — Encounter: Payer: Self-pay | Admitting: Physician Assistant

## 2019-02-23 ENCOUNTER — Other Ambulatory Visit: Payer: Self-pay

## 2019-02-23 ENCOUNTER — Ambulatory Visit (INDEPENDENT_AMBULATORY_CARE_PROVIDER_SITE_OTHER): Payer: Medicare HMO | Admitting: Physician Assistant

## 2019-02-23 VITALS — BP 120/76 | HR 72 | Temp 98.4°F | Ht 63.5 in | Wt 146.6 lb

## 2019-02-23 DIAGNOSIS — Z91018 Allergy to other foods: Secondary | ICD-10-CM

## 2019-02-23 DIAGNOSIS — Z91011 Allergy to milk products: Secondary | ICD-10-CM

## 2019-02-23 MED ORDER — EPINEPHRINE 0.3 MG/0.3ML IJ SOAJ: 0.3000 mg | INTRAMUSCULAR | 2 refills | Status: DC | PRN

## 2019-02-23 NOTE — Progress Notes (Signed)
BP 120/76   Pulse 72   Temp 98.4 F (36.9 C) (Oral)   Ht 5' 3.5" (1.613 m)   Wt 146 lb 9.6 oz (66.5 kg)   BMI 25.56 kg/m    Subjective:    Patient ID: Jasmin Butler, female    DOB: 1950-12-28, 68 y.o.   MRN: [id number]  HPI: Jasmin Butler is a 68 y.o. female presenting on 02/23/2019 for Tick Removal  The patient was able to get the take off and she did take a round of antibiotic.  And it has cleared up.  However for an ongoing amount of time she has had GI distress related to milk intake and meat intake.  She will have tightness and bloating in her abdomen.  She will have watery diarrhea.  She has never seen blood.  She denies any shortness of breath or difficulty with breathing.  I will send in an EpiPen for her to have in case of severe allergic reaction.  She is directed to some resources on getting information concerning the allergies.  Past Medical History:  Diagnosis Date  . Anxiety   . Cataract   . Depression    "couple times/yr" (09/29/2014)  . GERD (gastroesophageal reflux disease) 2005  . Headache    "weekly" (09/29/2014)  . History of hiatal hernia   . Hyperlipidemia   . IBS (irritable bowel syndrome)   . Migraine    "2-3 times/yr" (09/29/2014)  . Pernicious anemia   . Pneumonia 1980's   "double"  . Thyroid disease    Relevant past medical, surgical, family and social history reviewed and updated as indicated. Interim medical history since our last visit reviewed. Allergies and medications reviewed and updated. DATA REVIEWED: CHART IN EPIC  Family History reviewed for pertinent findings.  Review of Systems  Constitutional: Negative.   HENT: Negative.   Eyes: Negative.   Respiratory: Negative.   Gastrointestinal: Negative.   Genitourinary: Negative.     Allergies as of 02/23/2019      Reactions   Penicillins Rash   Did it involve swelling of the face/tongue/throat, SOB, or low BP? Yes Did it involve sudden or severe rash/hives, skin peeling, or any  reaction on the inside of your mouth or nose? Unk Did you need to seek medical attention at a hospital or doctor's office? Yes When did it last happen? 30 years ago If all above answers are "NO", may proceed with cephalosporin use.      Medication List       Accurate as of Feb 23, 2019  4:19 PM. If you have any questions, ask your nurse or doctor.        STOP taking these medications   doxycycline 100 MG tablet Commonly known as:  VIBRA-TABS Stopped by:  Terald Sleeper, PA-C     TAKE these medications   co-enzyme Q-10 30 MG capsule Take 30 mg by mouth daily.   cyanocobalamin 1000 MCG/ML injection Commonly known as:  (VITAMIN B-12) Inject 1,000 mcg into the muscle every 30 (thirty) days.   levothyroxine 88 MCG tablet Commonly known as:  SYNTHROID Take 1 tablet (88 mcg total) by mouth daily.   pravastatin 40 MG tablet Commonly known as:  PRAVACHOL Take 1 tablet (40 mg total) by mouth daily.          Objective:    BP 120/76   Pulse 72   Temp 98.4 F (36.9 C) (Oral)   Ht 5' 3.5" (1.613 m)  Wt 146 lb 9.6 oz (66.5 kg)   BMI 25.56 kg/m   Allergies  Allergen Reactions  . Penicillins Rash    Did it involve swelling of the face/tongue/throat, SOB, or low BP? Yes Did it involve sudden or severe rash/hives, skin peeling, or any reaction on the inside of your mouth or nose? Unk Did you need to seek medical attention at a hospital or doctor's office? Yes When did it last happen? 30 years ago If all above answers are "NO", may proceed with cephalosporin use.     Wt Readings from Last 3 Encounters:  02/23/19 146 lb 9.6 oz (66.5 kg)  11/24/18 146 lb 12.8 oz (66.6 kg)  09/29/18 147 lb (66.7 kg)    Physical Exam Constitutional:      Appearance: She is well-developed.  HENT:     Head: Normocephalic and atraumatic.      Comments: Flat slightly tan lesion with minimal roughness  Eyes:     Conjunctiva/sclera: Conjunctivae normal.     Pupils: Pupils are equal,  round, and reactive to light.  Cardiovascular:     Rate and Rhythm: Normal rate and regular rhythm.     Heart sounds: Normal heart sounds.  Pulmonary:     Effort: Pulmonary effort is normal.     Breath sounds: Normal breath sounds.  Abdominal:     General: Bowel sounds are normal.     Palpations: Abdomen is soft.  Skin:    General: Skin is warm and dry.     Findings: No rash.  Neurological:     Mental Status: She is alert and oriented to person, place, and time.     Deep Tendon Reflexes: Reflexes are normal and symmetric.  Psychiatric:        Behavior: Behavior normal.        Thought Content: Thought content normal.        Judgment: Judgment normal.     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 Allergy to meat - Alpha-Gal Panel - Milk Component Panel   Continue all other maintenance medications as listed above.  Follow up plan: No follow-ups on file.  Educational handout given for milk allergy  Terald Sleeper PA-C [facility] [address]  [place], [place] [address] [phone]   02/23/2019, 4:19 PM

## 2019-02-23 NOTE — Patient Instructions (Addendum)
Creams with ingredients of alphahydroxy acid, glycolic for lactic acid   CeraVe  alpha gal  Food Choices for Milk Allergy, Adult Milk allergy is caused by two types of milk protein (casein and whey). A milk allergy is not the same as lactose intolerance. Lactose intolerance is the inability to break down a type of sugar in milk (lactose). Lactose intolerance will not cause a severe allergic reaction. A milk allergy can be mild or severe. It can cause symptoms that range from uncomfortable to serious or even life-threatening. Avoiding products that contain milk is the best way to avoid symptoms. If you have a milk allergy, talk with your health care provider or a dietitian about what foods you can and cannot eat. What are tips for following this plan? Avoiding milk, milk products, and foods that contain milk proteins is the best treatment plan for a milk allergy. However, milk is an important source of protein and other nutrients. Work with your health care provider or a dietitian to make sure that your diet includes enough other sources of these nutrients. Reading food labels Milk and milk proteins are found in many foods, so it is important to always read food labels to identify any food that contains milk proteins. Do not consume milk in any form, including condensed milk, derivative milk, dry milk, evaporated milk, buttermilk, acidophilus milk, and powdered milk. Other names for milk proteins or ingredients that contain milk proteins include:  Butter (or any ingredient that starts with the word "butter").  Ghee.  Casein.  Whey.  Nisin.  Galactose.  Diacetyl.  Lactalbumin.  Lactoferrin.  Lactose.  Lactulose.  Recaldent.  Tagatose. Shopping Milk and milk proteins are in many foods. When you are shopping, check for milk proteins in:  Yogurt.  Baked goods.  Pudding.  Custard.  Chocolate.  Curds, such as cottage cheese, cheese curds, queso blanco, or paneer.   Caramel.  Precooked or cured meat, such as sausages or meat loaves. These types of meat often use the milk protein casein as a binder.  Margarine.  Nougat.  Shellfish. Shellfish may be dipped in milk to reduce odor.  Canned tuna fish.  Energy drinks.  Chewing gum. The items listed above may not be a complete list of foods and beverages that you should avoid. Contact your health care provider or a dietitian for more information. Cooking When cooking:  Do not use milk or milk products.  If you are following a recipe that calls for milk, you can substitute other ingredients, such as: ? Water. ? Juice. ? Soy or rice milk or other milk alternatives. General information   Talk to your health care provider about a prescription for an epinephrine auto-injector. Epinephrine is a medicine that can reverse or prevent a severe allergic reaction (anaphylaxis). If you are at risk for a severe allergic reaction from milk proteins, you may need to carry an epinephrine auto-injector with you at all times.  When you go out to eat, always ask your server if your food contains or was prepared with any milk or dairy products.  For kosher foods, the word "pareve" is used to designate a milk-free food. However, kosher pareve foods may have small amounts of milk protein. Do not assume that these foods are safe if you have a milk allergy.  When products are labeled as lactose-free, this does not mean that they are free of milk proteins. If you have a milk allergy, do not consume these foods or drinks.  Some  food ingredients sound like they may contain milk or milk proteins, but they do not. Examples include: ? Calcium lactate or lactylate. ? Cocoa butter. ? Cream of tartar. ? Lactic acid. ? Sodium lactate or sodium stearoyl lactylate. ? Oleoresin.  If you are allergic to cow's milk, you should also avoid milk and milk products from other animals, such as goats, sheep, and buffalo. Summary  Milk  allergy is caused by two types of milk protein (casein and whey).  A milk allergy is not the same as lactose intolerance, which is the inability to break down a type of sugar (lactose) found in milk.  Milk and milk proteins are found in many foods. Milk must be identified on the food label of any food that contains milk proteins.  Make sure you read food labels carefully when shopping. When cooking, substitute other ingredients for milk. This information is not intended to replace advice given to you by your health care provider. Make sure you discuss any questions you have with your health care provider. Document Released: 04/20/2018 Document Revised: 04/20/2018 Document Reviewed: 04/20/2018 Elsevier Interactive Patient Education  2019 [employer].

## 2019-02-26 ENCOUNTER — Other Ambulatory Visit: Payer: Self-pay | Admitting: Physician Assistant

## 2019-02-26 LAB — MILK COMPONENT PANEL
F076-IgE Alpha Lactalbumin: <0.1 kU/L
F077-IgE Beta Lactoglobulin: <0.1 kU/L
F078-IgE Casein: <0.1 kU/L

## 2019-02-26 LAB — ALPHA-GAL PANEL
Alpha Gal IgE*: 3.26 kU/L — ABNORMAL HIGH (ref <0.10)
Beef (Bos spp) IgE: 0.77 kU/L — ABNORMAL HIGH (ref <0.35)
Class Interpretation: 0
Class Interpretation: 2
Lamb/Mutton (Ovis spp) IgE: <0.1 kU/L (ref <0.35)
Pork (Sus spp) IgE: 0.2 kU/L (ref <0.35)

## 2019-03-12 ENCOUNTER — Other Ambulatory Visit: Payer: Self-pay

## 2019-03-15 ENCOUNTER — Other Ambulatory Visit: Payer: Self-pay

## 2019-03-15 ENCOUNTER — Ambulatory Visit (INDEPENDENT_AMBULATORY_CARE_PROVIDER_SITE_OTHER): Payer: Medicare HMO | Admitting: *Deleted

## 2019-03-15 DIAGNOSIS — D510 Vitamin B12 deficiency anemia due to intrinsic factor deficiency: Secondary | ICD-10-CM | POA: Diagnosis not present

## 2019-03-15 NOTE — Progress Notes (Signed)
Pt given Cyanocobalamin inj Tolerated well 

## 2019-03-19 ENCOUNTER — Other Ambulatory Visit: Payer: Self-pay | Admitting: Physician Assistant

## 2019-03-24 ENCOUNTER — Ambulatory Visit: Payer: Medicare HMO | Admitting: Physician Assistant

## 2019-04-13 ENCOUNTER — Other Ambulatory Visit: Payer: Self-pay

## 2019-04-14 ENCOUNTER — Ambulatory Visit (INDEPENDENT_AMBULATORY_CARE_PROVIDER_SITE_OTHER): Payer: Medicare HMO | Admitting: *Deleted

## 2019-04-14 DIAGNOSIS — D510 Vitamin B12 deficiency anemia due to intrinsic factor deficiency: Secondary | ICD-10-CM | POA: Diagnosis not present

## 2019-04-14 DIAGNOSIS — E538 Deficiency of other specified B group vitamins: Secondary | ICD-10-CM

## 2019-04-14 NOTE — Progress Notes (Signed)
Pt given Cyanocobalamin inj Tolerated well 

## 2019-05-13 ENCOUNTER — Other Ambulatory Visit: Payer: Self-pay

## 2019-05-14 ENCOUNTER — Ambulatory Visit (INDEPENDENT_AMBULATORY_CARE_PROVIDER_SITE_OTHER): Payer: Medicare HMO | Admitting: *Deleted

## 2019-05-14 DIAGNOSIS — E538 Deficiency of other specified B group vitamins: Secondary | ICD-10-CM

## 2019-05-14 DIAGNOSIS — D510 Vitamin B12 deficiency anemia due to intrinsic factor deficiency: Secondary | ICD-10-CM

## 2019-06-11 ENCOUNTER — Other Ambulatory Visit: Payer: Self-pay

## 2019-06-14 ENCOUNTER — Other Ambulatory Visit: Payer: Self-pay

## 2019-06-14 ENCOUNTER — Ambulatory Visit (INDEPENDENT_AMBULATORY_CARE_PROVIDER_SITE_OTHER): Payer: Medicare HMO | Admitting: *Deleted

## 2019-06-14 DIAGNOSIS — D510 Vitamin B12 deficiency anemia due to intrinsic factor deficiency: Secondary | ICD-10-CM

## 2019-06-14 DIAGNOSIS — E538 Deficiency of other specified B group vitamins: Secondary | ICD-10-CM

## 2019-06-14 DIAGNOSIS — Z23 Encounter for immunization: Secondary | ICD-10-CM | POA: Diagnosis not present

## 2019-06-14 NOTE — Progress Notes (Signed)
Pt given Cyanocobalamin inj Tolerated well 

## 2019-06-23 ENCOUNTER — Encounter: Payer: Self-pay | Admitting: Physician Assistant

## 2019-06-23 ENCOUNTER — Ambulatory Visit (INDEPENDENT_AMBULATORY_CARE_PROVIDER_SITE_OTHER): Payer: Medicare HMO | Admitting: *Deleted

## 2019-06-23 DIAGNOSIS — Z0000 Encounter for general adult medical examination without abnormal findings: Secondary | ICD-10-CM

## 2019-06-23 NOTE — Progress Notes (Signed)
MEDICARE ANNUAL WELLNESS VISIT  06/23/2019  Telephone Visit Disclaimer This Medicare AWV was conducted by telephone due to national recommendations for restrictions regarding the COVID-19 Pandemic (e.g. social distancing).  I verified, using two identifiers, that I am speaking with Jasmin Butler or their authorized healthcare agent. I discussed the limitations, risks, security, and privacy concerns of performing an evaluation and management service by telephone and the potential availability of an in-person appointment in the future. The patient expressed understanding and agreed to proceed.   Subjective:  Jasmin Butler is a 68 y.o. female patient of Jasmin Sleeper, PA-C who had a Medicare Annual Wellness Visit today via telephone. Jasmin Butler is Retired and lives alone. she has 3 children. she reports that she is socially active and does interact with friends/family regularly. she is minimally physically active and enjoys fishing, walking, taking care of her chickens, doves, and dogs.  Patient Care Team: Jasmin Butler as PCP - General (Physician Assistant)  Advanced Directives 06/23/2019 11/29/2018 09/29/2014 09/29/2014  Does Patient Have a Medical Advance Directive? No No No No  Would patient like information on creating a medical advance directive? No - Patient declined - No - patient declined information -    Hospital Utilization Over the Past 12 Months: # of hospitalizations or ER visits: 0 # of surgeries: 0  Review of Systems    Patient reports that her overall health is better compared to last year.  History obtained from chart review and the patient  Patient Reported Readings (BP, Pulse, CBG, Weight, etc) none  Pain Assessment       Current Medications & Allergies (verified) Allergies as of 06/23/2019      Reactions   Penicillins Rash   Did it involve swelling of the face/tongue/throat, SOB, or low BP? Yes Did it involve sudden or severe rash/hives, skin peeling, or  any reaction on the inside of your mouth or nose? Unk Did you need to seek medical attention at a hospital or doctor's office? Yes When did it last happen? 30 years ago If all above answers are "NO", may proceed with cephalosporin use.      Medication List       Accurate as of June 23, 2019  9:33 AM. If you have any questions, ask your nurse or doctor.        STOP taking these medications   co-enzyme Q-10 30 MG capsule     TAKE these medications   cyanocobalamin 1000 MCG/ML injection Commonly known as: (VITAMIN B-12) Inject 1,000 mcg into the muscle every 30 (thirty) days.   EPINEPHrine 0.3 mg/0.3 mL Soaj injection Commonly known as: EpiPen 2-Pak Inject 0.3 mLs (0.3 mg total) into the muscle as needed for anaphylaxis.   levothyroxine 88 MCG tablet Commonly known as: SYNTHROID TAKE 1 TABLET BY MOUTH EVERY DAY   pravastatin 40 MG tablet Commonly known as: PRAVACHOL TAKE 1 TABLET BY MOUTH EVERY DAY       History (reviewed): Past Medical History:  Diagnosis Date  . Allergy to alpha-gal   . Anxiety   . Cataract   . Depression    "couple times/yr" (09/29/2014)  . GERD (gastroesophageal reflux disease) 2005  . Headache    "weekly" (09/29/2014)  . History of hiatal hernia   . Hyperlipidemia   . IBS (irritable bowel syndrome)   . Migraine    "2-3 times/yr" (09/29/2014)  . Pernicious anemia   . Pneumonia 1980's   "double"  . Thyroid  disease    Past Surgical History:  Procedure Laterality Date  . ABDOMINAL HYSTERECTOMY  1985  . CARDIAC CATHETERIZATION  ~ 2005  . CATARACT EXTRACTION W/ INTRAOCULAR LENS  IMPLANT, BILATERAL Bilateral 2000's  . CHOLECYSTECTOMY OPEN  ~ 1990  . EYE SURGERY    . TUBAL LIGATION  ~ 1983   Family History  Problem Relation Age of Onset  . Leukemia Father   . Cancer Father        leukemia  . Hypertension Mother   . Stroke Mother 41  . Thyroid disease Mother   . Arthritis Mother   . Cancer Brother        4 different cancers   . Asthma Brother   . Birth defects Son    Social History   Socioeconomic History  . Marital status: Widowed    Spouse name: Not on file  . Number of children: 3  . Years of education: GED  . Highest education level: GED or equivalent  Occupational History  . Occupation: Retired- Investment banker, corporate  . Financial resource strain: Not hard at all  . Food insecurity    Worry: Never true    Inability: Never true  . Transportation needs    Medical: No    Non-medical: No  Tobacco Use  . Smoking status: Never Smoker  . Smokeless tobacco: Never Used  Substance and Sexual Activity  . Alcohol use: No  . Drug use: No  . Sexual activity: Not Currently  Lifestyle  . Physical activity    Days per week: 5 days    Minutes per session: 30 min  . Stress: Not at all  Relationships  . Social connections    Talks on phone: More than three times a week    Gets together: More than three times a week    Attends religious service: Never    Active member of club or organization: No    Attends meetings of clubs or organizations: Never    Relationship status: Widowed  Other Topics Concern  . Not on file  Social History Narrative  . Not on file    Activities of Daily Living In your present state of health, do you have any difficulty performing the following activities: 06/23/2019  Hearing? N  Vision? N  Difficulty concentrating or making decisions? N  Walking or climbing stairs? N  Dressing or bathing? N  Doing errands, shopping? N  Preparing Food and eating ? N  Using the Toilet? N  In the past six months, have you accidently leaked urine? N  Do you have problems with loss of bowel control? N  Managing your Medications? N  Managing your Finances? N  Housekeeping or managing your Housekeeping? N  Some recent data might be hidden    Patient Education/ Literacy    Exercise Current Exercise Habits: Home exercise routine, Time (Minutes): 30, Frequency (Times/Week): 5, Weekly  Exercise (Minutes/Week): 150, Intensity: Mild  Diet Patient reports consuming 3 meals a day and 2 snack(s) a day Patient reports that her primary diet is: Regular Patient reports that she does have regular access to food.   Depression Screen PHQ 2/9 Scores 06/23/2019 02/23/2019 11/24/2018 09/29/2018 05/29/2018 03/18/2018 01/16/2018  PHQ - 2 Score 0 0 0 0 0 0 0  PHQ- 9 Score - - - 0 - - -     Fall Risk Fall Risk  06/23/2019 02/23/2019 11/24/2018 09/29/2018 05/29/2018  Falls in the past year? 0 0 0 0 No  Objective:  Jasmin Butler seemed alert and oriented and she participated appropriately during our telephone visit.  Blood Pressure Weight BMI  BP Readings from Last 3 Encounters:  02/23/19 120/76  11/29/18 114/68  11/24/18 130/81   Wt Readings from Last 3 Encounters:  02/23/19 146 lb 9.6 oz (66.5 kg)  11/24/18 146 lb 12.8 oz (66.6 kg)  09/29/18 147 lb (66.7 kg)   BMI Readings from Last 1 Encounters:  02/23/19 25.56 kg/m    *Unable to obtain current vital signs, weight, and BMI due to telephone visit type  Hearing/Vision  . [place] did not seem to have difficulty with hearing/understanding during the telephone conversation . Reports that she has not had a formal eye exam by an eye care professional within the past year . Reports that she has not had a formal hearing evaluation within the past year *Unable to fully assess hearing and vision during telephone visit type  Cognitive Function: 6CIT Screen 06/23/2019  What Year? 0 points  What month? 0 points  What time? 0 points  Count back from 20 0 points  Months in reverse 0 points  Repeat phrase 0 points  Total Score 0   (Normal:0-7, Significant for Dysfunction: >8)  Normal Cognitive Function Screening: Yes   Immunization & Health Maintenance Record Immunization History  Administered Date(s) Administered  . Fluad Quad(high Dose 65+) 06/14/2019  . Influenza, High Dose Seasonal PF 07/15/2018  . Influenza-Unspecified  08/14/2014, 08/28/2016, 07/23/2017  . Pneumococcal Conjugate-13 12/12/2015  . Pneumococcal Polysaccharide-23 02/27/2017  . Tdap 06/19/2018    Health Maintenance  Topic Date Due  . DEXA SCAN  06/23/2019 (Originally 08/03/2014)  . MAMMOGRAM  06/22/2020 (Originally 08/04/1999)  . Fecal DNA (Cologuard)  12/03/2021  . TETANUS/TDAP  06/19/2028  . INFLUENZA VACCINE  Completed  . Hepatitis C Screening  Completed  . PNA vac Low Risk Adult  Completed       Assessment  This is a routine wellness examination for Jasmin Butler.  Health Maintenance: Due or Overdue There are no preventive care reminders to display for this patient.  Jasmin Butler does not need a referral for Community Assistance: Care Management:   no Social Work:    no Prescription Assistance:  no Nutrition/Diabetes Education:  no   Plan:  Personalized Goals Goals Addressed   None    Personalized Health Maintenance & Screening Recommendations  Advanced directives: has NO advanced directive - not interested in additional information  Lung Cancer Screening Recommended: no (Low Dose CT Chest recommended if Age 85-80 years, 30 pack-year currently smoking OR have quit w/in past 15 years) Hepatitis C Screening recommended: no HIV Screening recommended: no  Advanced Directives: Written information was not prepared per patient's request.  Referrals & Orders No orders of the defined types were placed in this encounter.   Follow-up Plan . Follow-up with Jasmin Sleeper, PA-C as planned . Schedule your yearly eye exam    I have personally reviewed and noted the following in the patient's chart:   . Medical and social history . Use of alcohol, tobacco or illicit drugs  . Current medications and supplements . Functional ability and status . Nutritional status . Physical activity . Advanced directives . List of other physicians . Hospitalizations, surgeries, and ER visits in previous 12 months . Vitals .  Screenings to include cognitive, depression, and falls . Referrals and appointments  In addition, I have reviewed and discussed with Jasmin Butler certain preventive protocols, quality metrics, and  best practice recommendations. A written personalized care plan for preventive services as well as general preventive health recommendations is available and can be mailed to the patient at her request.      Gareth Morgan, LPN  [phone]

## 2019-07-01 ENCOUNTER — Other Ambulatory Visit: Payer: Self-pay | Admitting: Physician Assistant

## 2019-07-14 ENCOUNTER — Other Ambulatory Visit: Payer: Self-pay

## 2019-07-15 ENCOUNTER — Ambulatory Visit (INDEPENDENT_AMBULATORY_CARE_PROVIDER_SITE_OTHER): Payer: Medicare HMO | Admitting: *Deleted

## 2019-07-15 DIAGNOSIS — D510 Vitamin B12 deficiency anemia due to intrinsic factor deficiency: Secondary | ICD-10-CM

## 2019-07-15 DIAGNOSIS — E538 Deficiency of other specified B group vitamins: Secondary | ICD-10-CM

## 2019-07-26 DIAGNOSIS — D485 Neoplasm of uncertain behavior of skin: Secondary | ICD-10-CM | POA: Diagnosis not present

## 2019-07-26 DIAGNOSIS — L819 Disorder of pigmentation, unspecified: Secondary | ICD-10-CM | POA: Diagnosis not present

## 2019-07-26 DIAGNOSIS — B078 Other viral warts: Secondary | ICD-10-CM | POA: Diagnosis not present

## 2019-07-26 DIAGNOSIS — L432 Lichenoid drug reaction: Secondary | ICD-10-CM | POA: Diagnosis not present

## 2019-07-27 ENCOUNTER — Other Ambulatory Visit: Payer: Self-pay | Admitting: Physician Assistant

## 2019-07-30 ENCOUNTER — Other Ambulatory Visit: Payer: Self-pay | Admitting: Physician Assistant

## 2019-08-05 DIAGNOSIS — R69 Illness, unspecified: Secondary | ICD-10-CM | POA: Diagnosis not present

## 2019-08-09 ENCOUNTER — Other Ambulatory Visit: Payer: Self-pay

## 2019-08-10 ENCOUNTER — Ambulatory Visit (INDEPENDENT_AMBULATORY_CARE_PROVIDER_SITE_OTHER): Payer: Medicare HMO | Admitting: Physician Assistant

## 2019-08-10 ENCOUNTER — Encounter: Payer: Self-pay | Admitting: Physician Assistant

## 2019-08-10 ENCOUNTER — Other Ambulatory Visit: Payer: Self-pay

## 2019-08-10 VITALS — BP 134/74 | HR 59 | Temp 95.7°F | Ht 63.5 in | Wt 143.4 lb

## 2019-08-10 DIAGNOSIS — E785 Hyperlipidemia, unspecified: Secondary | ICD-10-CM

## 2019-08-10 DIAGNOSIS — E039 Hypothyroidism, unspecified: Secondary | ICD-10-CM

## 2019-08-10 DIAGNOSIS — F419 Anxiety disorder, unspecified: Secondary | ICD-10-CM

## 2019-08-10 DIAGNOSIS — R69 Illness, unspecified: Secondary | ICD-10-CM | POA: Diagnosis not present

## 2019-08-10 DIAGNOSIS — D510 Vitamin B12 deficiency anemia due to intrinsic factor deficiency: Secondary | ICD-10-CM

## 2019-08-10 MED ORDER — BUSPIRONE HCL 10 MG PO TABS: 10.0000 mg | ORAL_TABLET | Freq: Three times a day (TID) | ORAL | 1 refills | Status: DC

## 2019-08-11 LAB — CBC WITH DIFFERENTIAL/PLATELET
Basophils Absolute: 0.1 10*3/uL (ref 0.0–0.2)
Basos: 2 %
EOS (ABSOLUTE): 0.1 10*3/uL (ref 0.0–0.4)
Eos: 2 %
Hematocrit: 37.7 % (ref 34.0–46.6)
Hemoglobin: 12.5 g/dL (ref 11.1–15.9)
Immature Grans (Abs): 0 10*3/uL (ref 0.0–0.1)
Immature Granulocytes: 0 %
Lymphocytes Absolute: 2 10*3/uL (ref 0.7–3.1)
Lymphs: 34 %
MCH: 27.2 pg (ref 26.6–33.0)
MCHC: 33.2 g/dL (ref 31.5–35.7)
MCV: 82 fL (ref 79–97)
Monocytes Absolute: 0.4 10*3/uL (ref 0.1–0.9)
Monocytes: 6 %
Neutrophils Absolute: 3.4 10*3/uL (ref 1.4–7.0)
Neutrophils: 56 %
Platelets: 267 10*3/uL (ref 150–450)
RBC: 4.59 x10E6/uL (ref 3.77–5.28)
RDW: 13.3 % (ref 11.7–15.4)
WBC: 6 10*3/uL (ref 3.4–10.8)

## 2019-08-11 LAB — TSH: TSH: 0.589 u[IU]/mL (ref 0.450–4.500)

## 2019-08-11 LAB — CMP14+EGFR
ALT: 9 IU/L (ref 0–32)
AST: 16 IU/L (ref 0–40)
Albumin/Globulin Ratio: 1.8 (ref 1.2–2.2)
Albumin: 4.4 g/dL (ref 3.8–4.8)
Alkaline Phosphatase: 74 IU/L (ref 39–117)
BUN/Creatinine Ratio: 19 (ref 12–28)
BUN: 12 mg/dL (ref 8–27)
Bilirubin Total: 0.3 mg/dL (ref 0.0–1.2)
CO2: 27 mmol/L (ref 20–29)
Calcium: 9.5 mg/dL (ref 8.7–10.3)
Chloride: 102 mmol/L (ref 96–106)
Creatinine, Ser: 0.63 mg/dL (ref 0.57–1.00)
GFR calc Af Amer: 105 mL/min/{1.73_m2} (ref >59)
GFR calc non Af Amer: 91 mL/min/{1.73_m2} (ref >59)
Globulin, Total: 2.4 g/dL (ref 1.5–4.5)
Glucose: 99 mg/dL (ref 65–99)
Potassium: 4.6 mmol/L (ref 3.5–5.2)
Sodium: 141 mmol/L (ref 134–144)
Total Protein: 6.8 g/dL (ref 6.0–8.5)

## 2019-08-11 LAB — LIPID PANEL
Chol/HDL Ratio: 3.9 ratio (ref 0.0–4.4)
Cholesterol, Total: 195 mg/dL (ref 100–199)
HDL: 50 mg/dL (ref >39)
LDL Chol Calc (NIH): 115 mg/dL — ABNORMAL HIGH (ref 0–99)
Triglycerides: 169 mg/dL — ABNORMAL HIGH (ref 0–149)
VLDL Cholesterol Cal: 30 mg/dL (ref 5–40)

## 2019-08-12 LAB — VITAMIN B12: Vitamin B-12: 476 pg/mL (ref 232–1245)

## 2019-08-12 LAB — SPECIMEN STATUS REPORT

## 2019-08-15 NOTE — Progress Notes (Signed)
BP 134/74   Pulse (!) 59   Temp (!) 95.7 F (35.4 C) (Temporal)   Ht 5' 3.5" (1.613 m)   Wt 143 lb 6.4 oz (65 kg)   SpO2 99%   BMI 25.00 kg/m    Subjective:    Patient ID: Jasmin Butler, female    DOB: August 02, 1951, 68 y.o.   MRN: [id number]  HPI: Jasmin Butler is a 69 y.o. female presenting on 08/10/2019 for Hypothyroidism, Hyperlipidemia, and sore on mouth  This patient comes in for periodic recheck on her chronic medical conditions which do include both thyroidism, hyperlipidemia, pernicious anemia.  She also has anxiety.  We have talked about using BuSpar as chronic anxiolytic that is nonnarcotic and she has agreed to try this.  She states that overall she is doing fairly well and not have any setbacks and is doing well.  She does need refills also.  Past Medical History:  Diagnosis Date  . Allergy to alpha-gal   . Anxiety   . Cataract   . Depression    "couple times/yr" (09/29/2014)  . GERD (gastroesophageal reflux disease) 2005  . Headache    "weekly" (09/29/2014)  . History of hiatal hernia   . Hyperlipidemia   . IBS (irritable bowel syndrome)   . Migraine    "2-3 times/yr" (09/29/2014)  . Pernicious anemia   . Pneumonia 1980's   "double"  . Thyroid disease    Relevant past medical, surgical, family and social history reviewed and updated as indicated. Interim medical history since our last visit reviewed. Allergies and medications reviewed and updated. DATA REVIEWED: CHART IN EPIC  Family History reviewed for pertinent findings.  Review of Systems  Constitutional: Negative.   HENT: Negative.   Eyes: Negative.   Respiratory: Negative.   Gastrointestinal: Negative.   Genitourinary: Negative.     Allergies as of 08/10/2019      Reactions   Penicillins Rash   Did it involve swelling of the face/tongue/throat, SOB, or low BP? Yes Did it involve sudden or severe rash/hives, skin peeling, or any reaction on the inside of your mouth or nose? Unk Did you  need to seek medical attention at a hospital or doctor's office? Yes When did it last happen? 30 years ago If all above answers are "NO", may proceed with cephalosporin use.      Medication List       Accurate as of August 10, 2019 11:59 PM. If you have any questions, ask your nurse or doctor.        busPIRone 10 MG tablet Commonly known as: BUSPAR Take 1 tablet (10 mg total) by mouth 3 (three) times daily. Started by: Terald Sleeper, PA-C   cyanocobalamin 1000 MCG/ML injection Commonly known as: (VITAMIN B-12) Inject 1,000 mcg into the muscle every 30 (thirty) days.   EPINEPHrine 0.3 mg/0.3 mL Soaj injection Commonly known as: EpiPen 2-Pak Inject 0.3 mLs (0.3 mg total) into the muscle as needed for anaphylaxis.   levothyroxine 88 MCG tablet Commonly known as: SYNTHROID TAKE 1 TABLET BY MOUTH EVERY DAY   pravastatin 40 MG tablet Commonly known as: PRAVACHOL Take 1 tablet (40 mg total) by mouth daily.          Objective:    BP 134/74   Pulse (!) 59   Temp (!) 95.7 F (35.4 C) (Temporal)   Ht 5' 3.5" (1.613 m)   Wt 143 lb 6.4 oz (65 kg)   SpO2 99%   BMI  25.00 kg/m   Allergies  Allergen Reactions  . Penicillins Rash    Did it involve swelling of the face/tongue/throat, SOB, or low BP? Yes Did it involve sudden or severe rash/hives, skin peeling, or any reaction on the inside of your mouth or nose? Unk Did you need to seek medical attention at a hospital or doctor's office? Yes When did it last happen? 30 years ago If all above answers are "NO", may proceed with cephalosporin use.     Wt Readings from Last 3 Encounters:  08/10/19 143 lb 6.4 oz (65 kg)  02/23/19 146 lb 9.6 oz (66.5 kg)  11/24/18 146 lb 12.8 oz (66.6 kg)    Physical Exam Constitutional:      General: She is not in acute distress.    Appearance: Normal appearance. She is well-developed.  HENT:     Head: Normocephalic and atraumatic.  Cardiovascular:     Rate and Rhythm: Normal rate.   Pulmonary:     Effort: Pulmonary effort is normal.  Skin:    General: Skin is warm and dry.     Findings: No rash.  Neurological:     Mental Status: She is alert and oriented to person, place, and time.     Deep Tendon Reflexes: Reflexes are normal and symmetric.     Results for orders placed or performed in visit on 08/10/19  CBC with Differential/Platelet  Result Value Ref Range   WBC 6.0 3.4 - 10.8 x10E3/uL   RBC 4.59 3.77 - 5.28 x10E6/uL   Hemoglobin 12.5 11.1 - 15.9 g/dL   Hematocrit 37.7 34.0 - 46.6 %   MCV 82 79 - 97 fL   MCH 27.2 26.6 - 33.0 pg   MCHC 33.2 31.5 - 35.7 g/dL   RDW 13.3 11.7 - 15.4 %   Platelets 267 150 - 450 x10E3/uL   Neutrophils 56 Not Estab. %   Lymphs 34 Not Estab. %   Monocytes 6 Not Estab. %   Eos 2 Not Estab. %   Basos 2 Not Estab. %   Neutrophils Absolute 3.4 1.4 - 7.0 x10E3/uL   Lymphocytes Absolute 2.0 0.7 - 3.1 x10E3/uL   Monocytes Absolute 0.4 0.1 - 0.9 x10E3/uL   EOS (ABSOLUTE) 0.1 0.0 - 0.4 x10E3/uL   Basophils Absolute 0.1 0.0 - 0.2 x10E3/uL   Immature Granulocytes 0 Not Estab. %   Immature Grans (Abs) 0.0 0.0 - 0.1 x10E3/uL  CMP14+EGFR  Result Value Ref Range   Glucose 99 65 - 99 mg/dL   BUN 12 8 - 27 mg/dL   Creatinine, Ser 0.63 0.57 - 1.00 mg/dL   GFR calc non Af Amer 91 >59 mL/min/1.73   GFR calc Af Amer 105 >59 mL/min/1.73   BUN/Creatinine Ratio 19 12 - 28   Sodium 141 134 - 144 mmol/L   Potassium 4.6 3.5 - 5.2 mmol/L   Chloride 102 96 - 106 mmol/L   CO2 27 20 - 29 mmol/L   Calcium 9.5 8.7 - 10.3 mg/dL   Total Protein 6.8 6.0 - 8.5 g/dL   Albumin 4.4 3.8 - 4.8 g/dL   Globulin, Total 2.4 1.5 - 4.5 g/dL   Albumin/Globulin Ratio 1.8 1.2 - 2.2   Bilirubin Total 0.3 0.0 - 1.2 mg/dL   Alkaline Phosphatase 74 39 - 117 IU/L   AST 16 0 - 40 IU/L   ALT 9 0 - 32 IU/L  Lipid panel  Result Value Ref Range   Cholesterol, Total 195 100 - 199 mg/dL  Triglycerides 169 (H) 0 - 149 mg/dL   HDL 50 >39 mg/dL   VLDL Cholesterol Cal  30 5 - 40 mg/dL   LDL Chol Calc (NIH) 115 (H) 0 - 99 mg/dL   Chol/HDL Ratio 3.9 0.0 - 4.4 ratio  TSH  Result Value Ref Range   TSH 0.589 0.450 - 4.500 uIU/mL  Vitamin B12  Result Value Ref Range   Vitamin B-12 476 232 - 1,245 pg/mL  Specimen status report  Result Value Ref Range   specimen status report Comment       Assessment & Plan:   1. Hypothyroidism, unspecified type - CBC with Differential/Platelet - CMP14+EGFR - TSH  2. Hyperlipidemia, unspecified hyperlipidemia type - CBC with Differential/Platelet - CMP14+EGFR - Lipid panel - TSH  3. Pernicious anemia - Vitamin B12 - Vitamin B12  4. Anxiety - busPIRone (BUSPAR) 10 MG tablet; Take 1 tablet (10 mg total) by mouth 3 (three) times daily.  Dispense: 90 tablet; Refill: 1   Continue all other maintenance medications as listed above.  Follow up plan: Return in about 6 months (around 02/08/2020).  Educational handout given for [place] PA-C [facility] [address]  [place], [place] [address] [phone]   08/15/2019, 10:22 PM

## 2019-08-16 ENCOUNTER — Ambulatory Visit (INDEPENDENT_AMBULATORY_CARE_PROVIDER_SITE_OTHER): Payer: Medicare HMO | Admitting: *Deleted

## 2019-08-16 DIAGNOSIS — E538 Deficiency of other specified B group vitamins: Secondary | ICD-10-CM

## 2019-08-16 DIAGNOSIS — D510 Vitamin B12 deficiency anemia due to intrinsic factor deficiency: Secondary | ICD-10-CM | POA: Diagnosis not present

## 2019-08-16 NOTE — Progress Notes (Signed)
Pt tolerated well

## 2019-08-19 ENCOUNTER — Other Ambulatory Visit: Payer: Self-pay | Admitting: Physician Assistant

## 2019-09-01 ENCOUNTER — Other Ambulatory Visit: Payer: Self-pay | Admitting: Physician Assistant

## 2019-09-01 DIAGNOSIS — F419 Anxiety disorder, unspecified: Secondary | ICD-10-CM

## 2019-09-13 ENCOUNTER — Other Ambulatory Visit: Payer: Self-pay

## 2019-09-14 ENCOUNTER — Ambulatory Visit (INDEPENDENT_AMBULATORY_CARE_PROVIDER_SITE_OTHER): Payer: Medicare HMO

## 2019-09-14 DIAGNOSIS — D510 Vitamin B12 deficiency anemia due to intrinsic factor deficiency: Secondary | ICD-10-CM | POA: Diagnosis not present

## 2019-09-14 DIAGNOSIS — E538 Deficiency of other specified B group vitamins: Secondary | ICD-10-CM

## 2019-09-14 NOTE — Progress Notes (Signed)
Cyanocobalamin injection given to left deltoid.  Patient tolerated well. 

## 2019-09-30 ENCOUNTER — Encounter: Payer: Self-pay | Admitting: Physician Assistant

## 2019-10-12 ENCOUNTER — Other Ambulatory Visit: Payer: Self-pay

## 2019-10-13 ENCOUNTER — Ambulatory Visit (INDEPENDENT_AMBULATORY_CARE_PROVIDER_SITE_OTHER): Payer: Medicare HMO | Admitting: *Deleted

## 2019-10-13 DIAGNOSIS — E538 Deficiency of other specified B group vitamins: Secondary | ICD-10-CM

## 2019-10-13 DIAGNOSIS — D510 Vitamin B12 deficiency anemia due to intrinsic factor deficiency: Secondary | ICD-10-CM

## 2019-10-23 ENCOUNTER — Other Ambulatory Visit: Payer: Self-pay | Admitting: Physician Assistant

## 2019-10-24 ENCOUNTER — Encounter: Payer: Self-pay | Admitting: Physician Assistant

## 2019-10-26 ENCOUNTER — Other Ambulatory Visit: Payer: Self-pay | Admitting: Physician Assistant

## 2019-10-26 ENCOUNTER — Ambulatory Visit (INDEPENDENT_AMBULATORY_CARE_PROVIDER_SITE_OTHER): Payer: Medicare HMO | Admitting: Physician Assistant

## 2019-10-26 ENCOUNTER — Encounter: Payer: Self-pay | Admitting: Physician Assistant

## 2019-10-26 DIAGNOSIS — M545 Low back pain, unspecified: Secondary | ICD-10-CM

## 2019-10-26 MED ORDER — METHOCARBAMOL 500 MG PO TABS: 500.0000 mg | ORAL_TABLET | Freq: Three times a day (TID) | ORAL | 0 refills | Status: DC | PRN

## 2019-10-26 MED ORDER — PREDNISONE 10 MG (48) PO TBPK: ORAL_TABLET | ORAL | 0 refills | Status: DC

## 2019-10-26 NOTE — Progress Notes (Signed)
[id number]     Telephone visit  Subjective: [id number] pain PCP: Terald Sleeper, PA-C [id number] Jasmin Butler is a 69 y.o. female calls for telephone consult today. Patient provides verbal consent for consult held via phone.  Patient is identified with 2 separate identifiers.  At this time the entire area is on COVID-19 social distancing and stay home orders are in place.  Patient is of higher risk and therefore we are performing this by a virtual method.  Location of patient: home Location of provider: HOME Others present for call: no  This patient has had a flareup of her back pain.  She does have a old history of having her back flareup.  She was just doing some work at home nothing significant.  And this has flared up and left her quite tight and hard time moving.  She gets stiff each morning.  She does have known arthritis.  She also has had times where her back went out and it took a few days for it to get better.  She was just concerned this time because it also is been a couple of days and she cannot tell a huge difference.  There are no fever chills no urinary tract symptoms, bowel and bladder functioning well.   ROS: Per HPI  Allergies  Allergen Reactions  . Penicillins Rash    Did it involve swelling of the face/tongue/throat, SOB, or low BP? Yes Did it involve sudden or severe rash/hives, skin peeling, or any reaction on the inside of your mouth or nose? Unk Did you need to seek medical attention at a hospital or doctor's office? Yes When did it last happen? 30 years ago If all above answers are "NO", may proceed with cephalosporin use.    Past Medical History:  Diagnosis Date  . Allergy to alpha-gal   . Anxiety   . Cataract   . Depression    "couple times/yr" (09/29/2014)  . GERD (gastroesophageal reflux disease) 2005  . Headache    "weekly" (09/29/2014)  . History of hiatal hernia   . Hyperlipidemia   . IBS (irritable bowel syndrome)   . Migraine    "2-3 times/yr"  (09/29/2014)  . Pernicious anemia   . Pneumonia 1980's   "double"  . Thyroid disease     Current Outpatient Medications:  .  busPIRone (BUSPAR) 10 MG tablet, TAKE 1 TABLET BY MOUTH THREE TIMES A DAY, Disp: 270 tablet, Rfl: 0 .  cyanocobalamin (,VITAMIN B-12,) 1000 MCG/ML injection, Inject 1,000 mcg into the muscle every 30 (thirty) days. , Disp: , Rfl:  .  cyclobenzaprine (FLEXERIL) 5 MG tablet, Please specify directions, refills and quantity, Disp: 30 tablet, Rfl: 0 .  EPINEPHrine (EPIPEN 2-PAK) 0.3 mg/0.3 mL IJ SOAJ injection, Inject 0.3 mLs (0.3 mg total) into the muscle as needed for anaphylaxis., Disp: 1 Device, Rfl: 2 .  levothyroxine (SYNTHROID) 88 MCG tablet, TAKE 1 TABLET BY MOUTH EVERY DAY, Disp: 90 tablet, Rfl: 2 .  pravastatin (PRAVACHOL) 40 MG tablet, TAKE 1 TABLET BY MOUTH EVERY DAY, Disp: 90 tablet, Rfl: 1 .  predniSONE (STERAPRED UNI-PAK 48 TAB) 10 MG (48) TBPK tablet, Take as directed for 12 days, Disp: 48 tablet, Rfl: 0  Current Facility-Administered Medications:  .  cyanocobalamin ((VITAMIN B-12)) injection 1,000 mcg, 1,000 mcg, Intramuscular, Q30 days, Eustaquio Maize, MD, 1,000 mcg at 10/13/19 0901  Assessment/ Plan: 68 y.o. female   1. Acute bilateral low back pain without sciatica - predniSONE (STERAPRED UNI-PAK 48 TAB)  10 MG (48) TBPK tablet; Take as directed for 12 days  Dispense: 48 tablet; Refill: 0   No follow-ups on file.  Continue all other maintenance medications as listed above.  Start time: 4:09 PM End time: 4:18 PM  Meds ordered this encounter  Medications  . predniSONE (STERAPRED UNI-PAK 48 TAB) 10 MG (48) TBPK tablet    Sig: Take as directed for 12 days    Dispense:  48 tablet    Refill:  0    Order Specific Question:   Supervising Provider    Answer:   [name] [id number]  . DISCONTD: methocarbamol (ROBAXIN) 500 MG tablet    Sig: Take 1 tablet (500 mg total) by mouth every 8 (eight) hours as needed for muscle spasms.     Dispense:  30 tablet    Refill:  0    Order Specific Question:   Supervising Provider    Answer:   [name] [id number]    Particia Nearing PA-C [place] [phone]

## 2019-10-27 ENCOUNTER — Other Ambulatory Visit: Payer: Self-pay | Admitting: Physician Assistant

## 2019-10-27 MED ORDER — CYCLOBENZAPRINE HCL 5 MG PO TABS: ORAL_TABLET | ORAL | 0 refills | Status: DC

## 2019-10-27 NOTE — Telephone Encounter (Signed)
Pharmacy comment: Alternative Requested:NOT COVERED BY INSURANCE . INS WANTS TIZANIDINE.  All Pharmacy Suggested Alternatives:   0 cyclobenzaprine (FLEXERIL) 5 MG tablet 0 chlorzoxazone (PARAFON FORTE) 250 MG tablet 0 tiZANidine (ZANAFLEX) 2 MG tablet  To prescribe one of the alternatives listed above, open the encounter and click Replace.

## 2019-10-28 ENCOUNTER — Telehealth: Payer: Self-pay | Admitting: *Deleted

## 2019-10-28 NOTE — Telephone Encounter (Signed)
Prior Auth for cyclobenzaprine hcl 5mg -In Process  Key: [id number] -   PA Case ID: [id number]   Your information has been submitted to [place] Medicare Part D. [employer] will review the request and will issue a decision, typically within 1-3 days from your submission. You can check the updated outcome later by reopening this request.  If [employer] has not responded in 1-3 days or if you have any questions about your ePA request, please contact [place] Medicare Part D at [phone]. If you think there may be a problem with your PA request, use our live chat feature at the bottom right.

## 2019-10-29 ENCOUNTER — Encounter: Payer: Self-pay | Admitting: Physician Assistant

## 2019-10-29 NOTE — Telephone Encounter (Signed)
Prior Auth for Cyclobenzaprine-APPROVED till 01/26/20  Pharmacy notified

## 2019-11-05 DIAGNOSIS — H31091 Other chorioretinal scars, right eye: Secondary | ICD-10-CM | POA: Diagnosis not present

## 2019-11-05 DIAGNOSIS — H43813 Vitreous degeneration, bilateral: Secondary | ICD-10-CM | POA: Diagnosis not present

## 2019-11-12 ENCOUNTER — Other Ambulatory Visit: Payer: Self-pay

## 2019-11-15 ENCOUNTER — Ambulatory Visit (INDEPENDENT_AMBULATORY_CARE_PROVIDER_SITE_OTHER): Payer: Medicare HMO | Admitting: *Deleted

## 2019-11-15 DIAGNOSIS — D510 Vitamin B12 deficiency anemia due to intrinsic factor deficiency: Secondary | ICD-10-CM | POA: Diagnosis not present

## 2019-11-15 DIAGNOSIS — E538 Deficiency of other specified B group vitamins: Secondary | ICD-10-CM

## 2019-11-28 ENCOUNTER — Other Ambulatory Visit: Payer: Self-pay | Admitting: Physician Assistant

## 2019-11-28 DIAGNOSIS — F419 Anxiety disorder, unspecified: Secondary | ICD-10-CM

## 2019-11-29 NOTE — Telephone Encounter (Signed)
OV 08/10/19 rtc 6 mos

## 2019-12-06 ENCOUNTER — Encounter: Payer: Self-pay | Admitting: Physician Assistant

## 2019-12-08 ENCOUNTER — Encounter: Payer: Self-pay | Admitting: Physician Assistant

## 2019-12-08 ENCOUNTER — Other Ambulatory Visit: Payer: Self-pay | Admitting: Physician Assistant

## 2019-12-08 MED ORDER — PANTOPRAZOLE SODIUM 40 MG PO TBEC: 40.0000 mg | DELAYED_RELEASE_TABLET | Freq: Every day | ORAL | 3 refills | Status: DC

## 2019-12-08 NOTE — Telephone Encounter (Signed)
Not on med list. Please advise.

## 2019-12-10 ENCOUNTER — Other Ambulatory Visit: Payer: Self-pay

## 2019-12-13 ENCOUNTER — Other Ambulatory Visit: Payer: Self-pay

## 2019-12-13 ENCOUNTER — Ambulatory Visit (INDEPENDENT_AMBULATORY_CARE_PROVIDER_SITE_OTHER): Payer: Medicare HMO

## 2019-12-13 DIAGNOSIS — D510 Vitamin B12 deficiency anemia due to intrinsic factor deficiency: Secondary | ICD-10-CM

## 2019-12-13 DIAGNOSIS — E538 Deficiency of other specified B group vitamins: Secondary | ICD-10-CM

## 2019-12-13 NOTE — Progress Notes (Signed)
Cyanocobalamin injection given to left deltoid.  Patient tolerated well. 

## 2019-12-28 DIAGNOSIS — H26492 Other secondary cataract, left eye: Secondary | ICD-10-CM | POA: Diagnosis not present

## 2019-12-28 DIAGNOSIS — H31091 Other chorioretinal scars, right eye: Secondary | ICD-10-CM | POA: Diagnosis not present

## 2019-12-28 DIAGNOSIS — H43813 Vitreous degeneration, bilateral: Secondary | ICD-10-CM | POA: Diagnosis not present

## 2020-01-05 ENCOUNTER — Encounter: Payer: Self-pay | Admitting: Physician Assistant

## 2020-01-11 ENCOUNTER — Ambulatory Visit (INDEPENDENT_AMBULATORY_CARE_PROVIDER_SITE_OTHER): Payer: Medicare HMO | Admitting: *Deleted

## 2020-01-11 ENCOUNTER — Other Ambulatory Visit: Payer: Self-pay

## 2020-01-11 DIAGNOSIS — E538 Deficiency of other specified B group vitamins: Secondary | ICD-10-CM

## 2020-01-11 DIAGNOSIS — D510 Vitamin B12 deficiency anemia due to intrinsic factor deficiency: Secondary | ICD-10-CM

## 2020-01-11 NOTE — Patient Instructions (Signed)

## 2020-01-11 NOTE — Progress Notes (Signed)
Pt given B12 injection IM right deltoid and tolerated well. 

## 2020-01-18 ENCOUNTER — Ambulatory Visit (INDEPENDENT_AMBULATORY_CARE_PROVIDER_SITE_OTHER): Payer: Medicare HMO | Admitting: Family Medicine

## 2020-01-18 DIAGNOSIS — E034 Atrophy of thyroid (acquired): Secondary | ICD-10-CM | POA: Diagnosis not present

## 2020-01-18 DIAGNOSIS — E782 Mixed hyperlipidemia: Secondary | ICD-10-CM | POA: Diagnosis not present

## 2020-01-18 DIAGNOSIS — D510 Vitamin B12 deficiency anemia due to intrinsic factor deficiency: Secondary | ICD-10-CM | POA: Diagnosis not present

## 2020-01-18 DIAGNOSIS — Z806 Family history of leukemia: Secondary | ICD-10-CM | POA: Diagnosis not present

## 2020-01-18 DIAGNOSIS — R5382 Chronic fatigue, unspecified: Secondary | ICD-10-CM | POA: Diagnosis not present

## 2020-01-18 MED ORDER — PRAVASTATIN SODIUM 40 MG PO TABS: 20.0000 mg | ORAL_TABLET | Freq: Every day | ORAL | 1 refills | Status: DC

## 2020-01-18 NOTE — Progress Notes (Signed)
Telephone visit  Subjective: CC: "weak" PCP: [name], DO [id number] Jasmin Butler is a 69 y.o. female calls for telephone consult today. Patient provides verbal consent for consult held via phone.  Due to COVID-19 pandemic this visit was conducted virtually. This visit type was conducted due to national recommendations for restrictions regarding the COVID-19 Pandemic (e.g. social distancing, sheltering in place) in an effort to limit this patient's exposure and mitigate transmission in our community. All issues noted in this document were discussed and addressed.  A physical exam was not performed with this format.   Location of patient: home Location of provider: [facility] Others present for call: none  1.  Weakness Patient reports this is a chronic intermittent issue.  This has been ongoing for years.  She is had several work-ups but no reason why she experiences these issues.  She does have a history of pernicious anemia that is treated with vitamin B12 intramuscularly every month.  Last vitamin B12 level was within normal range in October.  No evidence anemia at that time.  She also has history of hypothyroidism.  She reports compliance with her thyroid medication.  Denies any unplanned change in weight, cold intolerance, constipation.  Family history significant for leukemia in 2 relatives, which ultimately led to their deaths.  She constantly worries about this.  She is never had a work-up for this.  2.  Hyperlipidemia Patient reports that she is treated with Pravachol 40 mg daily but she is unable to tolerate this medication due to intermittent myalgias, particularly in the lower extremities.  She is self discontinued and started using an over-the-counter cholesterol medication.  She has never tried a lower dose of Pravachol nor tried Livalo.   ROS: Per HPI  Allergies  Allergen Reactions  . Penicillins Rash    Did it involve swelling of the face/tongue/throat, SOB, or low BP?  Yes Did it involve sudden or severe rash/hives, skin peeling, or any reaction on the inside of your mouth or nose? Unk Did you need to seek medical attention at a hospital or doctor's office? Yes When did it last happen? 30 years ago If all above answers are "NO", may proceed with cephalosporin use.    Past Medical History:  Diagnosis Date  . Allergy to alpha-gal   . Anxiety   . Cataract   . Depression    "couple times/yr" (09/29/2014)  . GERD (gastroesophageal reflux disease) 2005  . Headache    "weekly" (09/29/2014)  . History of hiatal hernia   . Hyperlipidemia   . IBS (irritable bowel syndrome)   . Migraine    "2-3 times/yr" (09/29/2014)  . Pernicious anemia   . Pneumonia 1980's   "double"  . Thyroid disease     Current Outpatient Medications:  .  busPIRone (BUSPAR) 10 MG tablet, TAKE 1 TABLET BY MOUTH THREE TIMES A DAY, Disp: 270 tablet, Rfl: 0 .  cyanocobalamin (,VITAMIN B-12,) 1000 MCG/ML injection, Inject 1,000 mcg into the muscle every 30 (thirty) days. , Disp: , Rfl:  .  cyclobenzaprine (FLEXERIL) 5 MG tablet, Please specify directions, refills and quantity, Disp: 30 tablet, Rfl: 0 .  EPINEPHrine (EPIPEN 2-PAK) 0.3 mg/0.3 mL IJ SOAJ injection, Inject 0.3 mLs (0.3 mg total) into the muscle as needed for anaphylaxis., Disp: 1 Device, Rfl: 2 .  levothyroxine (SYNTHROID) 88 MCG tablet, TAKE 1 TABLET BY MOUTH EVERY DAY, Disp: 90 tablet, Rfl: 2 .  pantoprazole (PROTONIX) 40 MG tablet, Take 1 tablet (40 mg total) by  mouth daily., Disp: 30 tablet, Rfl: 3 .  pravastatin (PRAVACHOL) 40 MG tablet, TAKE 1 TABLET BY MOUTH EVERY DAY, Disp: 90 tablet, Rfl: 1  Current Facility-Administered Medications:  .  cyanocobalamin ((VITAMIN B-12)) injection 1,000 mcg, 1,000 mcg, Intramuscular, Q30 days, Eustaquio Maize, MD, 1,000 mcg at 01/11/20 [id number]  The 10-year ASCVD risk score Mikey Bussing DC Jr., et al., 2013) is: 10.4%   Values used to calculate the score:     Age: 63 years     Sex:  Female     Is Non-Hispanic African American: No     Diabetic: No     Tobacco smoker: No     Systolic Blood Pressure: [id number] mmHg     Is BP treated: No     HDL Cholesterol: 50 mg/dL     Total Cholesterol: 195 mg/dL  Assessment/ Plan: 69 y.o. female   1. Pernicious anemia - Vitamin B12; Future  2. Hypothyroidism due to acquired atrophy of thyroid Fatigued - Thyroid Panel With TSH; Future  3. Family history of leukemia - Pathologist smear review; Future  4. Chronic fatigue Uncertain etiology.  B12 normal in Oct.  No anemia.  TSH normal.  Given family h/o leukemia and intermittent myalgia/ bone pain will get smear review. - Pathologist smear review; Future - Bayer DCA Hb A1c Waived; Future  5. Mixed hyperlipidemia Reduce to 20mg  of Pravastatin to see if she will tolerate.  May trial livalo if continues to have myalgia   Start time: 6:26pm End time: 6:33pm  Total time spent on patient care (including telephone call/ virtual visit): 18 minutes  [place], [place] [phone]

## 2020-01-19 ENCOUNTER — Other Ambulatory Visit: Payer: Self-pay

## 2020-01-19 ENCOUNTER — Other Ambulatory Visit: Payer: Medicare HMO

## 2020-01-19 DIAGNOSIS — E034 Atrophy of thyroid (acquired): Secondary | ICD-10-CM

## 2020-01-19 DIAGNOSIS — R739 Hyperglycemia, unspecified: Secondary | ICD-10-CM | POA: Diagnosis not present

## 2020-01-19 DIAGNOSIS — D510 Vitamin B12 deficiency anemia due to intrinsic factor deficiency: Secondary | ICD-10-CM

## 2020-01-19 DIAGNOSIS — R5382 Chronic fatigue, unspecified: Secondary | ICD-10-CM

## 2020-01-19 DIAGNOSIS — Z806 Family history of leukemia: Secondary | ICD-10-CM

## 2020-01-19 LAB — BAYER DCA HB A1C WAIVED: HB A1C (BAYER DCA - WAIVED): 6.1 % (ref <7.0)

## 2020-01-20 LAB — PATHOLOGIST SMEAR REVIEW
Basophils Absolute: 0.1 10*3/uL (ref 0.0–0.2)
Basos: 2 %
EOS (ABSOLUTE): 0.2 10*3/uL (ref 0.0–0.4)
Eos: 3 %
Hematocrit: 38.4 % (ref 34.0–46.6)
Hemoglobin: 12.5 g/dL (ref 11.1–15.9)
Immature Grans (Abs): 0 10*3/uL (ref 0.0–0.1)
Immature Granulocytes: 0 %
Lymphocytes Absolute: 1.9 10*3/uL (ref 0.7–3.1)
Lymphs: 35 %
MCH: 27.8 pg (ref 26.6–33.0)
MCHC: 32.6 g/dL (ref 31.5–35.7)
MCV: 85 fL (ref 79–97)
Monocytes Absolute: 0.4 10*3/uL (ref 0.1–0.9)
Monocytes: 7 %
Neutrophils Absolute: 3 10*3/uL (ref 1.4–7.0)
Neutrophils: 53 %
Platelets: 261 10*3/uL (ref 150–450)
RBC: 4.5 x10E6/uL (ref 3.77–5.28)
RDW: 13.5 % (ref 11.7–15.4)
WBC: 5.4 10*3/uL (ref 3.4–10.8)

## 2020-01-20 LAB — THYROID PANEL WITH TSH
Free Thyroxine Index: 2.5 (ref 1.2–4.9)
T3 Uptake Ratio: 31 % (ref 24–39)
T4, Total: 8.2 ug/dL (ref 4.5–12.0)
TSH: 0.405 u[IU]/mL — ABNORMAL LOW (ref 0.450–4.500)

## 2020-01-20 LAB — VITAMIN B12: Vitamin B-12: 716 pg/mL (ref 232–1245)

## 2020-01-24 ENCOUNTER — Encounter: Payer: Self-pay | Admitting: Family Medicine

## 2020-01-24 ENCOUNTER — Other Ambulatory Visit: Payer: Self-pay | Admitting: Family Medicine

## 2020-01-24 DIAGNOSIS — E034 Atrophy of thyroid (acquired): Secondary | ICD-10-CM

## 2020-01-24 MED ORDER — LEVOTHYROXINE SODIUM 75 MCG PO TABS: 75.0000 ug | ORAL_TABLET | Freq: Every day | ORAL | 0 refills | Status: DC

## 2020-02-08 ENCOUNTER — Encounter: Payer: Self-pay | Admitting: Family Medicine

## 2020-02-08 ENCOUNTER — Other Ambulatory Visit: Payer: Self-pay | Admitting: *Deleted

## 2020-02-08 MED ORDER — PANTOPRAZOLE SODIUM 40 MG PO TBEC: 40.0000 mg | DELAYED_RELEASE_TABLET | Freq: Every day | ORAL | 1 refills | Status: DC

## 2020-02-10 DIAGNOSIS — R69 Illness, unspecified: Secondary | ICD-10-CM | POA: Diagnosis not present

## 2020-02-11 ENCOUNTER — Ambulatory Visit (INDEPENDENT_AMBULATORY_CARE_PROVIDER_SITE_OTHER): Payer: Medicare HMO

## 2020-02-11 ENCOUNTER — Other Ambulatory Visit: Payer: Self-pay

## 2020-02-11 DIAGNOSIS — D510 Vitamin B12 deficiency anemia due to intrinsic factor deficiency: Secondary | ICD-10-CM | POA: Diagnosis not present

## 2020-02-11 DIAGNOSIS — E538 Deficiency of other specified B group vitamins: Secondary | ICD-10-CM

## 2020-02-11 NOTE — Progress Notes (Signed)
Cyanocobalamin injection given to left deltoid.  Patient tolerated well. 

## 2020-03-08 ENCOUNTER — Encounter: Payer: Self-pay | Admitting: Family Medicine

## 2020-03-10 ENCOUNTER — Ambulatory Visit (INDEPENDENT_AMBULATORY_CARE_PROVIDER_SITE_OTHER): Payer: Medicare HMO

## 2020-03-10 ENCOUNTER — Other Ambulatory Visit: Payer: Self-pay

## 2020-03-10 DIAGNOSIS — D510 Vitamin B12 deficiency anemia due to intrinsic factor deficiency: Secondary | ICD-10-CM

## 2020-03-10 DIAGNOSIS — E538 Deficiency of other specified B group vitamins: Secondary | ICD-10-CM

## 2020-03-10 NOTE — Progress Notes (Signed)
Patient given B12 injection and tolerated well.

## 2020-03-14 ENCOUNTER — Other Ambulatory Visit: Payer: Self-pay

## 2020-03-14 MED ORDER — PRAVASTATIN SODIUM 40 MG PO TABS: 20.0000 mg | ORAL_TABLET | Freq: Every day | ORAL | 0 refills | Status: DC

## 2020-03-15 ENCOUNTER — Other Ambulatory Visit: Payer: Self-pay | Admitting: Family Medicine

## 2020-03-20 ENCOUNTER — Other Ambulatory Visit: Payer: Self-pay

## 2020-03-20 ENCOUNTER — Other Ambulatory Visit: Payer: Self-pay | Admitting: *Deleted

## 2020-03-20 ENCOUNTER — Ambulatory Visit: Payer: Medicare HMO | Admitting: Family Medicine

## 2020-03-20 ENCOUNTER — Other Ambulatory Visit: Payer: Medicare HMO

## 2020-03-20 DIAGNOSIS — E034 Atrophy of thyroid (acquired): Secondary | ICD-10-CM

## 2020-03-21 LAB — THYROID PANEL WITH TSH
Free Thyroxine Index: 2.1 (ref 1.2–4.9)
T3 Uptake Ratio: 29 % (ref 24–39)
T4, Total: 7.1 ug/dL (ref 4.5–12.0)
TSH: 1.72 u[IU]/mL (ref 0.450–4.500)

## 2020-03-22 ENCOUNTER — Other Ambulatory Visit: Payer: Self-pay | Admitting: Family Medicine

## 2020-03-22 MED ORDER — LEVOTHYROXINE SODIUM 75 MCG PO TABS: 75.0000 ug | ORAL_TABLET | Freq: Every day | ORAL | 0 refills | Status: DC

## 2020-04-11 ENCOUNTER — Other Ambulatory Visit: Payer: Self-pay

## 2020-04-11 ENCOUNTER — Ambulatory Visit (INDEPENDENT_AMBULATORY_CARE_PROVIDER_SITE_OTHER): Payer: Medicare HMO | Admitting: *Deleted

## 2020-04-11 DIAGNOSIS — D510 Vitamin B12 deficiency anemia due to intrinsic factor deficiency: Secondary | ICD-10-CM | POA: Diagnosis not present

## 2020-04-11 DIAGNOSIS — E538 Deficiency of other specified B group vitamins: Secondary | ICD-10-CM

## 2020-04-11 NOTE — Progress Notes (Signed)
Patient in today for monthly B 12 injection. 1000 mcg given IM in left deltoid. Patient tolerated well.  

## 2020-04-16 ENCOUNTER — Other Ambulatory Visit: Payer: Self-pay | Admitting: Family Medicine

## 2020-04-21 ENCOUNTER — Encounter: Payer: Self-pay | Admitting: Family Medicine

## 2020-04-24 ENCOUNTER — Ambulatory Visit (INDEPENDENT_AMBULATORY_CARE_PROVIDER_SITE_OTHER): Payer: Medicare HMO | Admitting: Family Medicine

## 2020-04-24 ENCOUNTER — Encounter: Payer: Self-pay | Admitting: Family Medicine

## 2020-04-24 ENCOUNTER — Other Ambulatory Visit: Payer: Self-pay

## 2020-04-24 VITALS — BP 132/73 | HR 75 | Temp 98.7°F | Ht 63.0 in | Wt 144.0 lb

## 2020-04-24 DIAGNOSIS — J029 Acute pharyngitis, unspecified: Secondary | ICD-10-CM

## 2020-04-24 DIAGNOSIS — E034 Atrophy of thyroid (acquired): Secondary | ICD-10-CM | POA: Diagnosis not present

## 2020-04-24 MED ORDER — LORATADINE 10 MG PO TABS: 10.0000 mg | ORAL_TABLET | Freq: Every day | ORAL | 11 refills | Status: DC

## 2020-04-24 MED ORDER — MAGIC MOUTHWASH W/LIDOCAINE: ORAL | 0 refills | Status: DC

## 2020-04-24 MED ORDER — AZITHROMYCIN 250 MG PO TABS: ORAL_TABLET | ORAL | 0 refills | Status: DC

## 2020-04-24 NOTE — Patient Instructions (Signed)
If no better by Friday, start Zpak.  If you develop a true fever or worsening symptoms, start the Zpak.  Magic mouthwash prescribed.  SWISH but DO NOT swallow.  Start Claritin (loratidine) and take daily.

## 2020-04-24 NOTE — Progress Notes (Signed)
Subjective: CC: f/u thyroid, sore throat PCP: [name], DO [id number] Jasmin Butler is a 69 y.o. female presenting to clinic today for:  1. Sore throat She has had several days of "mild sore throat" that began after she was exposed to mulch which likely had mold in it.  She denies any associated headache, nausea, rash.  Her temperature never got above 99.6 F.  She started using Benadryl yesterday but has not noticed a significant improvement.  No known sick contacts.  No other sick symptoms.  Non smoker.  Brother chewed tobacco and had tonsil cancer.  2. Hypothyroidism Doing much better on the change in dose of Synthroid.  No change in voice, tremor, change in bowel habit or heart palpitations.  Her energy has improved on the new dose of Synthroid.   ROS: Per HPI  Allergies  Allergen Reactions  . Penicillins Rash    Did it involve swelling of the face/tongue/throat, SOB, or low BP? Yes Did it involve sudden or severe rash/hives, skin peeling, or any reaction on the inside of your mouth or nose? Unk Did you need to seek medical attention at a hospital or doctor's office? Yes When did it last happen? 30 years ago If all above answers are "NO", may proceed with cephalosporin use.    Past Medical History:  Diagnosis Date  . Allergy to alpha-gal   . Anxiety   . Cataract   . Depression    "couple times/yr" (09/29/2014)  . GERD (gastroesophageal reflux disease) 2005  . Headache    "weekly" (09/29/2014)  . History of hiatal hernia   . Hyperlipidemia   . IBS (irritable bowel syndrome)   . Migraine    "2-3 times/yr" (09/29/2014)  . Pernicious anemia   . Pneumonia 1980's   "double"  . Thyroid disease     Current Outpatient Medications:  .  busPIRone (BUSPAR) 10 MG tablet, TAKE 1 TABLET BY MOUTH THREE TIMES A DAY, Disp: 270 tablet, Rfl: 0 .  cyanocobalamin (,VITAMIN B-12,) 1000 MCG/ML injection, Inject 1,000 mcg into the muscle every 30 (thirty) days. , Disp: , Rfl:  .   cyclobenzaprine (FLEXERIL) 5 MG tablet, Please specify directions, refills and quantity, Disp: 30 tablet, Rfl: 0 .  EPINEPHrine (EPIPEN 2-PAK) 0.3 mg/0.3 mL IJ SOAJ injection, Inject 0.3 mLs (0.3 mg total) into the muscle as needed for anaphylaxis., Disp: 1 Device, Rfl: 2 .  levothyroxine (SYNTHROID) 75 MCG tablet, TAKE 1 TABLET BY MOUTH EVERY DAY, Disp: 90 tablet, Rfl: 3 .  pantoprazole (PROTONIX) 40 MG tablet, Take 1 tablet (40 mg total) by mouth daily., Disp: 90 tablet, Rfl: 1 .  pravastatin (PRAVACHOL) 40 MG tablet, TAKE 1 TABLET BY MOUTH EVERY DAY, Disp: 90 tablet, Rfl: 1  Current Facility-Administered Medications:  .  cyanocobalamin ((VITAMIN B-12)) injection 1,000 mcg, 1,000 mcg, Intramuscular, Q30 days, Eustaquio Maize, MD, 1,000 mcg at 04/11/20 4944 Social History   Socioeconomic History  . Marital status: Widowed    Spouse name: Not on file  . Number of children: 3  . Years of education: GED  . Highest education level: GED or equivalent  Occupational History  . Occupation: Retired- Engineer, materials  Tobacco Use  . Smoking status: Never Smoker  . Smokeless tobacco: Never Used  Vaping Use  . Vaping Use: Never used  Substance and Sexual Activity  . Alcohol use: No  . Drug use: No  . Sexual activity: Not Currently  Other Topics Concern  . Not on file  Social History Narrative  . Not on file   Social Determinants of Health   Financial Resource Strain:   . Difficulty of Paying Living Expenses:   Food Insecurity:   . Worried About Charity fundraiser in the Last Year:   . Arboriculturist in the Last Year:   Transportation Needs:   . Film/video editor (Medical):   Marland Kitchen Lack of Transportation (Non-Medical):   Physical Activity:   . Days of Exercise per Week:   . Minutes of Exercise per Session:   Stress:   . Feeling of Stress :   Social Connections:   . Frequency of Communication with Friends and Family:   . Frequency of Social Gatherings with Friends and Family:    . Attends Religious Services:   . Active Member of Clubs or Organizations:   . Attends Archivist Meetings:   Marland Kitchen Marital Status:   Intimate Partner Violence:   . Fear of Current or Ex-Partner:   . Emotionally Abused:   Marland Kitchen Physically Abused:   . Sexually Abused:    Family History  Problem Relation Age of Onset  . Leukemia Father   . Cancer Father        leukemia  . Hypertension Mother   . Stroke Mother 25  . Thyroid disease Mother   . Arthritis Mother   . Cancer Brother        4 different cancers  . Asthma Brother   . Birth defects Son     Objective: Office vital signs reviewed. BP 132/73   Pulse 75   Temp 98.7 F (37.1 C) (Temporal)   Ht 5\' 3"  (1.6 m)   Wt 144 lb (65.3 kg)   SpO2 96%   BMI 25.51 kg/m   Physical Examination:  General: Awake, alert, well nourished, No acute distress HEENT: Normal; oropharynx minimally erythematous.  No exudates.  No enlarged tonsils.  No masses.  No goiter Cardio: regular rate and rhythm, S1S2 heard, no murmurs appreciated Pulm: clear to auscultation bilaterally, no wheezes, rhonchi or rales; normal work of breathing on room air Extremities: warm, well perfused, No edema, cyanosis or clubbing; +2 pulses bilaterally MSK: normal gait and station  Assessment/ Plan: 69 y.o. female   1. Hypothyroidism due to acquired atrophy of thyroid Under much better control and symptoms have totally improved on new dose.  Her recent thyroid panel was within normal range.  Continue current regimen  2. Sore throat Start Claritin.  Magic mouthwash with lidocaine provided.  At this point does not appear to be infectious.  I have given her a pocket prescription for Z-Pak to use if symptoms do not improve or she develops any other worrisome symptoms or signs - loratadine (CLARITIN) 10 MG tablet; Take 1 tablet (10 mg total) by mouth daily.  Dispense: 30 tablet; Refill: 11 - magic mouthwash w/lidocaine SOLN; Gargle and spit 20mL every 6 hours as  needed for sore throat. 68mL lidocaine, 24mL nystatin, 60mg  hydrocortisone tab, qs benadryl total 413mL.  Dispense: 480 mL; Refill: 0 - azithromycin (ZITHROMAX) 250 MG tablet; Take 2 tablets today, then take 1 tablet daily until gone.  Dispense: 6 tablet; Refill: 0   No orders of the defined types were placed in this encounter.  No orders of the defined types were placed in this encounter.    [name], DO [place] [phone]

## 2020-05-11 ENCOUNTER — Ambulatory Visit (INDEPENDENT_AMBULATORY_CARE_PROVIDER_SITE_OTHER): Payer: Medicare HMO

## 2020-05-11 ENCOUNTER — Other Ambulatory Visit: Payer: Self-pay

## 2020-05-11 DIAGNOSIS — E538 Deficiency of other specified B group vitamins: Secondary | ICD-10-CM

## 2020-05-11 DIAGNOSIS — D510 Vitamin B12 deficiency anemia due to intrinsic factor deficiency: Secondary | ICD-10-CM | POA: Diagnosis not present

## 2020-05-11 NOTE — Progress Notes (Signed)
Cyanocobalamin injection given to right deltoid.  Patient tolerated well. 

## 2020-05-22 ENCOUNTER — Telehealth (INDEPENDENT_AMBULATORY_CARE_PROVIDER_SITE_OTHER): Payer: Medicare HMO | Admitting: Family Medicine

## 2020-05-22 ENCOUNTER — Encounter: Payer: Self-pay | Admitting: Family Medicine

## 2020-05-22 DIAGNOSIS — S6010XA Contusion of unspecified finger with damage to nail, initial encounter: Secondary | ICD-10-CM

## 2020-05-22 DIAGNOSIS — S60132A Contusion of left middle finger with damage to nail, initial encounter: Secondary | ICD-10-CM | POA: Diagnosis not present

## 2020-05-22 NOTE — Progress Notes (Addendum)
MyChart Video visit  Subjective: CC:?  Finger infection PCP: [name], DO RFF:[id number] Jasmin Butler is a 69 y.o. female. Patient provides verbal consent for consult held via video.  Due to COVID-19 pandemic this visit was conducted virtually. This visit type was conducted due to national recommendations for restrictions regarding the COVID-19 Pandemic (e.g. social distancing, sheltering in place) in an effort to limit this patient's exposure and mitigate transmission in our community. All issues noted in this document were discussed and addressed.  A physical exam was not performed with this format.   Location of patient: home Location of provider: [facility] Others present for call: none  1.  LEFT Finger injury Patient reports over the weekend she was camping and accidentally smashed her middle finger.  She likely was camping with to paramedics and they made a hole in her fingernail to relieve pressure from the developing subungual hematoma.  She notes that she did bleed and that this did relieve the pressure.  She is had subsequent yellowing of the cuticle and this made her concerned for possible infection.  Denies any purulence.  She does report ongoing soft tissue swelling but no significant erythema or warmth.  She has been using soaks to treat.   ROS: Per HPI  Allergies  Allergen Reactions  . Penicillins Rash    Did it involve swelling of the face/tongue/throat, SOB, or low BP? Yes Did it involve sudden or severe rash/hives, skin peeling, or any reaction on the inside of your mouth or nose? Unk Did you need to seek medical attention at a hospital or doctor's office? Yes When did it last happen? 30 years ago If all above answers are "NO", may proceed with cephalosporin use.    Past Medical History:  Diagnosis Date  . Allergy to alpha-gal   . Anxiety   . Cataract   . Depression    "couple times/yr" (09/29/2014)  . GERD (gastroesophageal reflux disease) 2005  . Headache     "weekly" (09/29/2014)  . History of hiatal hernia   . Hyperlipidemia   . IBS (irritable bowel syndrome)   . Migraine    "2-3 times/yr" (09/29/2014)  . Pernicious anemia   . Pneumonia 1980's   "double"  . Thyroid disease     Current Outpatient Medications:  .  azithromycin (ZITHROMAX) 250 MG tablet, Take 2 tablets today, then take 1 tablet daily until gone., Disp: 6 tablet, Rfl: 0 .  busPIRone (BUSPAR) 10 MG tablet, TAKE 1 TABLET BY MOUTH THREE TIMES A DAY, Disp: 270 tablet, Rfl: 0 .  cyanocobalamin (,VITAMIN B-12,) 1000 MCG/ML injection, Inject 1,000 mcg into the muscle every 30 (thirty) days. , Disp: , Rfl:  .  EPINEPHrine (EPIPEN 2-PAK) 0.3 mg/0.3 mL IJ SOAJ injection, Inject 0.3 mLs (0.3 mg total) into the muscle as needed for anaphylaxis., Disp: 1 Device, Rfl: 2 .  levothyroxine (SYNTHROID) 75 MCG tablet, TAKE 1 TABLET BY MOUTH EVERY DAY, Disp: 90 tablet, Rfl: 3 .  loratadine (CLARITIN) 10 MG tablet, Take 1 tablet (10 mg total) by mouth daily., Disp: 30 tablet, Rfl: 11 .  magic mouthwash w/lidocaine SOLN, Gargle and spit 53mL every 6 hours as needed for sore throat. 44mL lidocaine, 72mL nystatin, 60mg  hydrocortisone tab, qs benadryl total 474mL., Disp: 480 mL, Rfl: 0 .  pantoprazole (PROTONIX) 40 MG tablet, Take 1 tablet (40 mg total) by mouth daily., Disp: 90 tablet, Rfl: 1 .  pravastatin (PRAVACHOL) 40 MG tablet, TAKE 1 TABLET BY MOUTH EVERY DAY, Disp:  90 tablet, Rfl: 1  Current Facility-Administered Medications:  .  cyanocobalamin ((VITAMIN B-12)) injection 1,000 mcg, 1,000 mcg, Intramuscular, Q30 days, Eustaquio Maize, MD, 1,000 mcg at 05/11/20 5520  Fingers: Middle finger with subungual hematoma appreciated.  There is a central punctum.  There is mild soft tissue swelling appreciated.  There is mild resolving ecchymosis noted at the cuticle.  No appreciable erythema  Assessment/ Plan: 69 y.o. female   Subungual hematoma of digit of hand, initial encounter  No evidence of  secondary bacterial infection.  She does have some yellow discoloration of the cuticle but I think that this is resolving bruising.  We discussed red flags signs and symptoms warranting further evaluation.  We discussed signs and symptoms of infection that would warrant antibiotics.  She voiced good understanding, continue Epsom salt soaks.  Start time: 2:08pm (called); 2:10 (text sent) End time: 2:16pm  Total time spent on patient care (including video visit/ documentation): 10 minutes  [place], [place] [phone]

## 2020-05-24 ENCOUNTER — Ambulatory Visit: Payer: Medicare HMO | Admitting: Family Medicine

## 2020-06-07 ENCOUNTER — Emergency Department (HOSPITAL_COMMUNITY): Payer: Medicare HMO

## 2020-06-07 ENCOUNTER — Encounter (HOSPITAL_COMMUNITY): Payer: Self-pay

## 2020-06-07 ENCOUNTER — Observation Stay (HOSPITAL_COMMUNITY)
Admission: EM | Admit: 2020-06-07 | Discharge: 2020-06-08 | Disposition: A | Discharge status: Home / Self Care | Payer: Medicare HMO | Attending: Internal Medicine | Admitting: Internal Medicine

## 2020-06-07 ENCOUNTER — Other Ambulatory Visit: Payer: Self-pay

## 2020-06-07 DIAGNOSIS — R69 Illness, unspecified: Secondary | ICD-10-CM | POA: Diagnosis not present

## 2020-06-07 DIAGNOSIS — Z20822 Contact with and (suspected) exposure to covid-19: Secondary | ICD-10-CM | POA: Insufficient documentation

## 2020-06-07 DIAGNOSIS — E785 Hyperlipidemia, unspecified: Secondary | ICD-10-CM | POA: Diagnosis not present

## 2020-06-07 DIAGNOSIS — D510 Vitamin B12 deficiency anemia due to intrinsic factor deficiency: Secondary | ICD-10-CM | POA: Diagnosis not present

## 2020-06-07 DIAGNOSIS — Z79899 Other long term (current) drug therapy: Secondary | ICD-10-CM | POA: Diagnosis not present

## 2020-06-07 DIAGNOSIS — F329 Major depressive disorder, single episode, unspecified: Secondary | ICD-10-CM | POA: Diagnosis not present

## 2020-06-07 DIAGNOSIS — I499 Cardiac arrhythmia, unspecified: Secondary | ICD-10-CM | POA: Diagnosis not present

## 2020-06-07 DIAGNOSIS — R079 Chest pain, unspecified: Principal | ICD-10-CM | POA: Diagnosis present

## 2020-06-07 DIAGNOSIS — F32A Depression, unspecified: Secondary | ICD-10-CM | POA: Diagnosis present

## 2020-06-07 DIAGNOSIS — F419 Anxiety disorder, unspecified: Secondary | ICD-10-CM | POA: Diagnosis not present

## 2020-06-07 DIAGNOSIS — R0789 Other chest pain: Secondary | ICD-10-CM | POA: Diagnosis not present

## 2020-06-07 DIAGNOSIS — K589 Irritable bowel syndrome without diarrhea: Secondary | ICD-10-CM | POA: Diagnosis not present

## 2020-06-07 DIAGNOSIS — R0689 Other abnormalities of breathing: Secondary | ICD-10-CM | POA: Diagnosis not present

## 2020-06-07 DIAGNOSIS — G43909 Migraine, unspecified, not intractable, without status migrainosus: Secondary | ICD-10-CM | POA: Insufficient documentation

## 2020-06-07 DIAGNOSIS — I10 Essential (primary) hypertension: Secondary | ICD-10-CM | POA: Diagnosis not present

## 2020-06-07 DIAGNOSIS — E039 Hypothyroidism, unspecified: Secondary | ICD-10-CM | POA: Diagnosis present

## 2020-06-07 DIAGNOSIS — I700 Atherosclerosis of aorta: Secondary | ICD-10-CM | POA: Diagnosis not present

## 2020-06-07 LAB — CBC
HCT: 38.2 % (ref 36.0–46.0)
Hemoglobin: 12.1 g/dL (ref 12.0–15.0)
MCH: 27.5 pg (ref 26.0–34.0)
MCHC: 31.7 g/dL (ref 30.0–36.0)
MCV: 86.8 fL (ref 80.0–100.0)
Platelets: 236 10*3/uL (ref 150–400)
RBC: 4.4 MIL/uL (ref 3.87–5.11)
RDW: 13.5 % (ref 11.5–15.5)
WBC: 7.9 10*3/uL (ref 4.0–10.5)
nRBC: 0 % (ref 0.0–0.2)

## 2020-06-07 LAB — BASIC METABOLIC PANEL
Anion gap: 11 (ref 5–15)
BUN: 11 mg/dL (ref 8–23)
CO2: 25 mmol/L (ref 22–32)
Calcium: 9.1 mg/dL (ref 8.9–10.3)
Chloride: 102 mmol/L (ref 98–111)
Creatinine, Ser: 0.69 mg/dL (ref 0.44–1.00)
GFR calc Af Amer: >60 mL/min (ref >60)
GFR calc non Af Amer: >60 mL/min (ref >60)
Glucose, Bld: 103 mg/dL — ABNORMAL HIGH (ref 70–99)
Potassium: 3.7 mmol/L (ref 3.5–5.1)
Sodium: 138 mmol/L (ref 135–145)

## 2020-06-07 LAB — SARS CORONAVIRUS 2 BY RT PCR (HOSPITAL ORDER, PERFORMED IN ~~LOC~~ HOSPITAL LAB): SARS Coronavirus 2: NEGATIVE

## 2020-06-07 LAB — TROPONIN I (HIGH SENSITIVITY)
Troponin I (High Sensitivity): 3 ng/L (ref <18)
Troponin I (High Sensitivity): 4 ng/L (ref <18)

## 2020-06-07 MED ORDER — ENOXAPARIN SODIUM 40 MG/0.4ML ~~LOC~~ SOLN
40.0000 mg | SUBCUTANEOUS | Status: DC
  Administered 2020-06-08: 40 mg via SUBCUTANEOUS
  Filled 2020-06-07: qty 0.4

## 2020-06-07 MED ORDER — NITROGLYCERIN 0.4 MG SL SUBL: 0.4000 mg | SUBLINGUAL_TABLET | SUBLINGUAL | Status: DC | PRN

## 2020-06-07 MED ORDER — ACETAMINOPHEN 325 MG PO TABS
650.0000 mg | ORAL_TABLET | Freq: Four times a day (QID) | ORAL | Status: DC | PRN
  Administered 2020-06-07 – 2020-06-08 (×2): 650 mg via ORAL
  Filled 2020-06-07 (×2): qty 2

## 2020-06-07 MED ORDER — ONDANSETRON HCL 4 MG/2ML IJ SOLN: 4.0000 mg | Freq: Four times a day (QID) | INTRAMUSCULAR | Status: DC | PRN

## 2020-06-07 MED ORDER — ONDANSETRON HCL 4 MG PO TABS: 4.0000 mg | ORAL_TABLET | Freq: Four times a day (QID) | ORAL | Status: DC | PRN

## 2020-06-07 MED ORDER — LEVOTHYROXINE SODIUM 75 MCG PO TABS
75.0000 ug | ORAL_TABLET | Freq: Every day | ORAL | Status: DC
  Administered 2020-06-08: 75 ug via ORAL
  Filled 2020-06-07: qty 1

## 2020-06-07 MED ORDER — IOHEXOL 350 MG/ML SOLN
100.0000 mL | Freq: Once | INTRAVENOUS | Status: AC | PRN
  Administered 2020-06-07: 100 mL via INTRAVENOUS

## 2020-06-07 MED ORDER — ACETAMINOPHEN 500 MG PO TABS
1000.0000 mg | ORAL_TABLET | Freq: Once | ORAL | Status: AC
  Administered 2020-06-07: 1000 mg via ORAL
  Filled 2020-06-07: qty 2

## 2020-06-07 MED ORDER — LORATADINE 10 MG PO TABS
10.0000 mg | ORAL_TABLET | Freq: Every day | ORAL | Status: DC
  Administered 2020-06-08: 10 mg via ORAL
  Filled 2020-06-07: qty 1

## 2020-06-07 MED ORDER — ASPIRIN EC 81 MG PO TBEC
81.0000 mg | DELAYED_RELEASE_TABLET | Freq: Every day | ORAL | Status: DC
  Administered 2020-06-08: 81 mg via ORAL
  Filled 2020-06-07: qty 1

## 2020-06-07 MED ORDER — ACETAMINOPHEN 650 MG RE SUPP: 650.0000 mg | Freq: Four times a day (QID) | RECTAL | Status: DC | PRN

## 2020-06-07 MED ORDER — POLYETHYLENE GLYCOL 3350 17 G PO PACK: 17.0000 g | PACK | Freq: Every day | ORAL | Status: DC | PRN

## 2020-06-07 MED ORDER — IOHEXOL 300 MG/ML  SOLN: 100.0000 mL | Freq: Once | INTRAMUSCULAR | Status: DC | PRN

## 2020-06-07 MED ORDER — PRAVASTATIN SODIUM 40 MG PO TABS
40.0000 mg | ORAL_TABLET | Freq: Every day | ORAL | Status: DC
  Administered 2020-06-08: 40 mg via ORAL
  Filled 2020-06-07: qty 1

## 2020-06-07 NOTE — H&P (Signed)
History and Physical    Jasmin Butler GNF:[id number] DOB: 05-Oct-1951 DOA: 06/07/2020  PCP: [name], DO   Patient coming from: Home  I have personally briefly reviewed patient's old medical records in [place]  Chief Complaint: Chest Pain  HPI: Jasmin Butler is a 69 y.o. female with medical history significant for depression, IBS, thyroid disease.  Patient presented to the ED with complaints of intermittent chest pain that started yesterday.  She reports chest pain started yesterday afternoon while she was chasing chickens, she describes pain as pressure-like, and stabbing, radiating to her back.  Chest pain resolved after 3 to 4 hours when she rested.  Chest pain recurred again today while she was moving about doing her daily household chores, chest pain resolved after nitro was given in the ED.  Chest pain is not associated with difficulty breathing, nausea or vomiting.  Patient reports she has had similar chest pain about 15 to 20 years ago, she reports at that time she had a what she describes to me as a cardiac cath done which was normal.    Never smoked cigarettes.  ED Course: Vitals.  Troponin 3 > 4.  EKG shows sinus rhythm QTc 462, without acute changes.  CT a chest abdomen and pelvis negative for dissection or other acute abnormality.  The provider talked to cardiologist on-call, recommended admission for observation and likely stress test tomorrow.  Review of Systems: As per HPI all other systems reviewed and negative.  Past Medical History:  Diagnosis Date  . Allergy to alpha-gal   . Anxiety   . Cataract   . Depression    "couple times/yr" (09/29/2014)  . GERD (gastroesophageal reflux disease) 2005  . Headache    "weekly" (09/29/2014)  . History of hiatal hernia   . Hyperlipidemia   . IBS (irritable bowel syndrome)   . Migraine    "2-3 times/yr" (09/29/2014)  . Pernicious anemia   . Pneumonia 1980's   "double"  . Thyroid disease     Past  Surgical History:  Procedure Laterality Date  . ABDOMINAL HYSTERECTOMY  1985  . CARDIAC CATHETERIZATION  ~ 2005  . CATARACT EXTRACTION W/ INTRAOCULAR LENS  IMPLANT, BILATERAL Bilateral 2000's  . CHOLECYSTECTOMY OPEN  ~ 1990  . EYE SURGERY    . TUBAL LIGATION  ~ 1983     reports that she has never smoked. She has never used smokeless tobacco. She reports that she does not drink alcohol and does not use drugs.  Allergies  Allergen Reactions  . Penicillins Rash    Did it involve swelling of the face/tongue/throat, SOB, or low BP? Yes Did it involve sudden or severe rash/hives, skin peeling, or any reaction on the inside of your mouth or nose? Unk Did you need to seek medical attention at a hospital or doctor's office? Yes When did it last happen? 30 years ago If all above answers are "NO", may proceed with cephalosporin use.     Family History  Problem Relation Age of Onset  . Leukemia Father   . Cancer Father        leukemia  . Hypertension Mother   . Stroke Mother 75  . Thyroid disease Mother   . Arthritis Mother   . Cancer Brother        4 different cancers  . Asthma Brother   . Birth defects Son     Prior to Admission medications   Medication Sig Start Date End Date Taking?  Authorizing Provider  cyanocobalamin (,VITAMIN B-12,) 1000 MCG/ML injection Inject 1,000 mcg into the muscle every 30 (thirty) days.  11/09/14  Yes [provider]  EPINEPHrine (EPIPEN 2-PAK) 0.3 mg/0.3 mL IJ SOAJ injection Inject 0.3 mLs (0.3 mg total) into the muscle as needed for anaphylaxis. 02/23/19  Yes Terald Sleeper, PA-C  levothyroxine (SYNTHROID) 75 MCG tablet TAKE 1 TABLET BY MOUTH EVERY DAY 04/18/20  Yes Gottschalk, Ashly M, DO  loratadine (CLARITIN) 10 MG tablet Take 1 tablet (10 mg total) by mouth daily. 04/24/20  Yes Gottschalk, Ashly M, DO  pravastatin (PRAVACHOL) 40 MG tablet TAKE 1 TABLET BY MOUTH EVERY DAY 03/15/20  Yes Gottschalk, Ashly M, DO  azithromycin (ZITHROMAX) 250 MG  tablet Take 2 tablets today, then take 1 tablet daily until gone. Patient not taking: Reported on 06/07/2020 04/24/20   [name], DO  busPIRone (BUSPAR) 10 MG tablet TAKE 1 TABLET BY MOUTH THREE TIMES A DAY Patient not taking: Reported on 06/07/2020 11/29/19   Terald Sleeper, PA-C  magic mouthwash w/lidocaine SOLN Gargle and spit 71mL every 6 hours as needed for sore throat. 61mL lidocaine, 5mL nystatin, 60mg  hydrocortisone tab, qs benadryl total 434mL. Patient not taking: Reported on 06/07/2020 04/24/20   [name], DO  pantoprazole (PROTONIX) 40 MG tablet Take 1 tablet (40 mg total) by mouth daily. Patient not taking: Reported on 06/07/2020 02/08/20   [name], DO    Physical Exam: Vitals:   06/07/20 1230 06/07/20 1300 06/07/20 1330 06/07/20 1414  BP: 129/74 130/74 (!) 147/85 (!) 143/87  Pulse: 67 68 66 78  Resp: 12 20 18 17   Temp:    97.9 F (36.6 C)  TempSrc:    Oral  SpO2: 94% 94% 98% 98%  Weight:      Height:        Constitutional: NAD, calm, comfortable Vitals:   06/07/20 1230 06/07/20 1300 06/07/20 1330 06/07/20 1414  BP: 129/74 130/74 (!) 147/85 (!) 143/87  Pulse: 67 68 66 78  Resp: 12 20 18 17   Temp:    97.9 F (36.6 C)  TempSrc:    Oral  SpO2: 94% 94% 98% 98%  Weight:      Height:       Eyes: PERRL, lids and conjunctivae normal ENMT: Mucous membranes are moist.  Neck: normal, supple, no masses, no thyromegaly Respiratory: clear to auscultation bilaterally, no wheezing, no crackles. Normal respiratory effort. No accessory muscle use.  Cardiovascular: Regular rate and rhythm, no murmurs / rubs / gallops. No extremity edema. 2+ pedal pulses. Abdomen: no tenderness, no masses palpated. No hepatosplenomegaly. Bowel sounds positive.  Musculoskeletal: no clubbing / cyanosis. No joint deformity upper and lower extremities. Good ROM, no contractures.   Skin: no rashes, lesions, ulcers. No induration Neurologic: No apparent cranial abnormality,  moving extremities spontaneously. Psychiatric: Normal judgment and insight. Alert and oriented x 3. Normal mood.   Labs on Admission: I have personally reviewed following labs and imaging studies  CBC: Recent Labs  Lab 06/07/20 1223  WBC 7.9  HGB 12.1  HCT 38.2  MCV 86.8  PLT 170   Basic Metabolic Panel: Recent Labs  Lab 06/07/20 1223  NA 138  K 3.7  CL 102  CO2 25  GLUCOSE 103*  BUN 11  CREATININE 0.69  CALCIUM 9.1    Radiological Exams on Admission: CT Angio Chest/Abd/Pel for Dissection W and/or Wo Contrast  Result Date: 06/07/2020 CLINICAL DATA:  Chest pain/back pain. Evaluate for aortic  dissection. EXAM: CT ANGIOGRAPHY CHEST, ABDOMEN AND PELVIS TECHNIQUE: Non-contrast CT of the chest was initially obtained. Multidetector CT imaging through the chest, abdomen and pelvis was performed using the standard protocol during bolus administration of intravenous contrast. Multiplanar reconstructed images and MIPs were obtained and reviewed to evaluate the vascular anatomy. CONTRAST:  131mL OMNIPAQUE IOHEXOL 350 MG/ML SOLN COMPARISON:  None. FINDINGS: CTA CHEST FINDINGS Cardiovascular: Preferential opacification of the thoracic aorta. No evidence of thoracic aortic aneurysm or dissection. Normal heart size. No pericardial effusion. Mediastinum/Nodes: No enlarged mediastinal, hilar, or axillary lymph nodes. Thyroid gland, trachea, and esophagus demonstrate no significant findings. Lungs/Pleura: Dependent atelectasis. Musculoskeletal: Review of the MIP images confirms the above findings. CTA ABDOMEN AND PELVIS FINDINGS VASCULAR Aorta: Normal caliber aorta without aneurysm, dissection, vasculitis or significant stenosis. Mild atherosclerosis. Celiac: Patent without evidence of aneurysm, dissection, vasculitis or significant stenosis. Mild calcific atherosclerosis. SMA: Patent without evidence of aneurysm, dissection, vasculitis or significant stenosis. Renals: Both renal arteries are patent  without evidence of aneurysm, dissection, vasculitis, fibromuscular dysplasia or significant stenosis. IMA: Patent without evidence of aneurysm, dissection, vasculitis or significant stenosis. Inflow: Patent without evidence of aneurysm, dissection, vasculitis or significant stenosis. Veins: No obvious venous abnormality within the limitations of this arterial phase study. Review of the MIP images confirms the above findings. NON-VASCULAR Hepatobiliary: No focal liver abnormality is seen. Cholecystectomy. Borderline extrahepatic biliary dilatation, likely related to post-cholecystectomy state. Pancreas: Unremarkable. No pancreatic ductal dilatation or surrounding inflammatory changes. Spleen: Normal in size without focal abnormality.  Splenule. Adrenals/Urinary Tract: Adrenal glands are unremarkable. Kidneys are normal, without renal calculi, focal lesion, or hydronephrosis. Symmetric renal enhancement. Mild fullness of the ureters is likely related to a distended bladder. Stomach/Bowel: Stomach is within normal limits. Appendix appears normal. No evidence of bowel wall thickening, distention, or inflammatory changes. Moderate colonic stool burden. Lymphatic: No lymphadenopathy. Reproductive: Status post hysterectomy. No adnexal masses. Other: No abdominal wall hernia or abnormality. No abdominopelvic ascites. Musculoskeletal: No acute or significant osseous findings. Mild multilevel degenerative changes of the spine. Review of the MIP images confirms the above findings. IMPRESSION: 1. No evidence of acute abnormality.  No aortic dissection. 2. No significant arterial stenosis. 3. Distended bladder. Electronically Signed   By: Margaretha Sheffield MD   On: 06/07/2020 14:26    EKG: Independently reviewed.  Sinus rhythm QTc 462.  No significant ST or T wave changes compared to prior EKG  Assessment/Plan Principal Problem:   Chest pain Active Problems:   Depression   Pernicious anemia   Hypothyroidism   Chest  pain-typical chest pains, no CAD history.  Troponins 3 > 4.  EKG without abnormalities.  CTA chest negative for dissection or PE.  Aspirin 325 nitro given by EMS with improvement in pain. Never smoker. -Continue aspirin 81 mg daily -Lipid panel in the morning -Resume pravastatin -Obtain echocardiogram -Cardiology to see in the morning -N.p.o. midnight -As needed nitro -EKG in the morning  Hypothyroidism -Resume Synthroid  Pernicious anemia-hemoglobin stable 12.1.  With normal RBC indices.  She is on monthly B12 injections.    DVT prophylaxis Lovenox Code Status: Full code Family Communication: None at bedside Disposition Plan: 1 - 2 days Consults called: Cardiology Admission status: Observation, telemetry   Bethena Roys MD [facility]  06/07/2020, 6:03 PM

## 2020-06-07 NOTE — ED Provider Notes (Signed)
[facility] EMERGENCY DEPARTMENT Provider Note   CSN: [id number] Arrival date & time: 06/07/20  1217     History Chief Complaint  Patient presents with  . Chest Pain    Jasmin Butler is a 69 y.o. female.  Patient presents after episode of intermittent chest pressure since yesterday afternoon.  This started when she was exerting herself chasing chickens.  Mild radiation to the back with pressure.  No shortness of breath cough or fever.  Patient was given aspirin by EMS and 2 nitro that helped.  No known cardiac history.  Patient has history of pernicious anemia and hyperlipidemia.  No blood clot or heart attack history.        Past Medical History:  Diagnosis Date  . Allergy to alpha-gal   . Anxiety   . Cataract   . Depression    "couple times/yr" (09/29/2014)  . GERD (gastroesophageal reflux disease) 2005  . Headache    "weekly" (09/29/2014)  . History of hiatal hernia   . Hyperlipidemia   . IBS (irritable bowel syndrome)   . Migraine    "2-3 times/yr" (09/29/2014)  . Pernicious anemia   . Pneumonia 1980's   "double"  . Thyroid disease     Patient Active Problem List   Diagnosis Date Noted  . Hypothyroidism 11/26/2018  . Pernicious anemia 11/03/2017  . Dysphagia 01/12/2016  . Multiple thyroid nodules 03/17/2015  . Dyspnea 10/27/2014  . Pulmonary infiltrates 10/27/2014  . Thyroid nodule 10/27/2014  . Chest pain   . Hyperlipidemia 02/29/2008  . ANEMIA 02/29/2008  . DEPRESSION 02/29/2008  . ARTHRITIS 02/29/2008    Past Surgical History:  Procedure Laterality Date  . ABDOMINAL HYSTERECTOMY  1985  . CARDIAC CATHETERIZATION  ~ 2005  . CATARACT EXTRACTION W/ INTRAOCULAR LENS  IMPLANT, BILATERAL Bilateral 2000's  . CHOLECYSTECTOMY OPEN  ~ 1990  . EYE SURGERY    . TUBAL LIGATION  ~ 1983     OB History   No obstetric history on file.     Family History  Problem Relation Age of Onset  . Leukemia Father   . Cancer Father        leukemia  .  Hypertension Mother   . Stroke Mother 60  . Thyroid disease Mother   . Arthritis Mother   . Cancer Brother        4 different cancers  . Asthma Brother   . Birth defects Son     Social History   Tobacco Use  . Smoking status: Never Smoker  . Smokeless tobacco: Never Used  Vaping Use  . Vaping Use: Never used  Substance Use Topics  . Alcohol use: No  . Drug use: No    Home Medications Prior to Admission medications   Medication Sig Start Date End Date Taking? Authorizing Provider  cyanocobalamin (,VITAMIN B-12,) 1000 MCG/ML injection Inject 1,000 mcg into the muscle every 30 (thirty) days.  11/09/14  Yes [provider]  EPINEPHrine (EPIPEN 2-PAK) 0.3 mg/0.3 mL IJ SOAJ injection Inject 0.3 mLs (0.3 mg total) into the muscle as needed for anaphylaxis. 02/23/19  Yes Terald Sleeper, PA-C  levothyroxine (SYNTHROID) 75 MCG tablet TAKE 1 TABLET BY MOUTH EVERY DAY 04/18/20  Yes Gottschalk, Ashly M, DO  loratadine (CLARITIN) 10 MG tablet Take 1 tablet (10 mg total) by mouth daily. 04/24/20  Yes Gottschalk, Ashly M, DO  pravastatin (PRAVACHOL) 40 MG tablet TAKE 1 TABLET BY MOUTH EVERY DAY 03/15/20  Yes Ronnie Doss M, DO  azithromycin (ZITHROMAX) 250 MG tablet Take 2 tablets today, then take 1 tablet daily until gone. Patient not taking: Reported on 06/07/2020 04/24/20   [name], DO  busPIRone (BUSPAR) 10 MG tablet TAKE 1 TABLET BY MOUTH THREE TIMES A DAY Patient not taking: Reported on 06/07/2020 11/29/19   Terald Sleeper, PA-C  magic mouthwash w/lidocaine SOLN Gargle and spit 41mL every 6 hours as needed for sore throat. 48mL lidocaine, 35mL nystatin, 60mg  hydrocortisone tab, qs benadryl total 469mL. Patient not taking: Reported on 06/07/2020 04/24/20   [name], DO  pantoprazole (PROTONIX) 40 MG tablet Take 1 tablet (40 mg total) by mouth daily. Patient not taking: Reported on 06/07/2020 02/08/20   [name], DO    Allergies    Penicillins  Review  of Systems   Review of Systems  Constitutional: Negative for chills, diaphoresis and fever.  HENT: Negative for congestion.   Eyes: Negative for visual disturbance.  Respiratory: Negative for shortness of breath.   Cardiovascular: Positive for chest pain. Negative for leg swelling.  Gastrointestinal: Negative for abdominal pain and vomiting.  Genitourinary: Negative for dysuria and flank pain.  Musculoskeletal: Negative for back pain, neck pain and neck stiffness.  Skin: Negative for rash.  Neurological: Negative for light-headedness and headaches.    Physical Exam Updated Vital Signs BP (!) 143/87 (BP Location: Right Arm)   Pulse 78   Temp 97.9 F (36.6 C) (Oral)   Resp 17   Ht 5\' 3"  (1.6 m)   Wt 63.5 kg   SpO2 98%   BMI 24.80 kg/m   Physical Exam Vitals and nursing note reviewed.  Constitutional:      Appearance: She is well-developed.  HENT:     Head: Normocephalic and atraumatic.  Eyes:     General:        Right eye: No discharge.        Left eye: No discharge.     Conjunctiva/sclera: Conjunctivae normal.  Neck:     Trachea: No tracheal deviation.  Cardiovascular:     Rate and Rhythm: Normal rate and regular rhythm.     Heart sounds: Normal heart sounds.  Pulmonary:     Effort: Pulmonary effort is normal.     Breath sounds: Normal breath sounds.  Abdominal:     General: There is no distension.     Palpations: Abdomen is soft.     Tenderness: There is no abdominal tenderness. There is no guarding.  Musculoskeletal:     Cervical back: Normal range of motion and neck supple.  Skin:    General: Skin is warm.     Findings: No rash.  Neurological:     Mental Status: She is alert and oriented to person, place, and time.     ED Results / Procedures / Treatments   Labs (all labs ordered are listed, but only abnormal results are displayed) Labs Reviewed  BASIC METABOLIC PANEL - Abnormal; Notable for the following components:      Result Value   Glucose, Bld  103 (*)    All other components within normal limits  SARS CORONAVIRUS 2 BY RT PCR (HOSPITAL ORDER, [facility])  CBC  TROPONIN I (HIGH SENSITIVITY)  TROPONIN I (HIGH SENSITIVITY)    EKG EKG Interpretation  Date/Time:  Wednesday June 07 2020 12:20:27 EDT Ventricular Rate:  73 PR Interval:    QRS Duration: 87 QT Interval:  419 QTC Calculation: 462 R Axis:   -24  Text Interpretation: Sinus rhythm Borderline left axis deviation Confirmed by Elnora Morrison [phone]) on 06/07/2020 12:25:14 PM   Radiology CT Angio Chest/Abd/Pel for Dissection W and/or Wo Contrast  Result Date: 06/07/2020 CLINICAL DATA:  Chest pain/back pain. Evaluate for aortic dissection. EXAM: CT ANGIOGRAPHY CHEST, ABDOMEN AND PELVIS TECHNIQUE: Non-contrast CT of the chest was initially obtained. Multidetector CT imaging through the chest, abdomen and pelvis was performed using the standard protocol during bolus administration of intravenous contrast. Multiplanar reconstructed images and MIPs were obtained and reviewed to evaluate the vascular anatomy. CONTRAST:  180mL OMNIPAQUE IOHEXOL 350 MG/ML SOLN COMPARISON:  None. FINDINGS: CTA CHEST FINDINGS Cardiovascular: Preferential opacification of the thoracic aorta. No evidence of thoracic aortic aneurysm or dissection. Normal heart size. No pericardial effusion. Mediastinum/Nodes: No enlarged mediastinal, hilar, or axillary lymph nodes. Thyroid gland, trachea, and esophagus demonstrate no significant findings. Lungs/Pleura: Dependent atelectasis. Musculoskeletal: Review of the MIP images confirms the above findings. CTA ABDOMEN AND PELVIS FINDINGS VASCULAR Aorta: Normal caliber aorta without aneurysm, dissection, vasculitis or significant stenosis. Mild atherosclerosis. Celiac: Patent without evidence of aneurysm, dissection, vasculitis or significant stenosis. Mild calcific atherosclerosis. SMA: Patent without evidence of aneurysm, dissection, vasculitis or  significant stenosis. Renals: Both renal arteries are patent without evidence of aneurysm, dissection, vasculitis, fibromuscular dysplasia or significant stenosis. IMA: Patent without evidence of aneurysm, dissection, vasculitis or significant stenosis. Inflow: Patent without evidence of aneurysm, dissection, vasculitis or significant stenosis. Veins: No obvious venous abnormality within the limitations of this arterial phase study. Review of the MIP images confirms the above findings. NON-VASCULAR Hepatobiliary: No focal liver abnormality is seen. Cholecystectomy. Borderline extrahepatic biliary dilatation, likely related to post-cholecystectomy state. Pancreas: Unremarkable. No pancreatic ductal dilatation or surrounding inflammatory changes. Spleen: Normal in size without focal abnormality.  Splenule. Adrenals/Urinary Tract: Adrenal glands are unremarkable. Kidneys are normal, without renal calculi, focal lesion, or hydronephrosis. Symmetric renal enhancement. Mild fullness of the ureters is likely related to a distended bladder. Stomach/Bowel: Stomach is within normal limits. Appendix appears normal. No evidence of bowel wall thickening, distention, or inflammatory changes. Moderate colonic stool burden. Lymphatic: No lymphadenopathy. Reproductive: Status post hysterectomy. No adnexal masses. Other: No abdominal wall hernia or abnormality. No abdominopelvic ascites. Musculoskeletal: No acute or significant osseous findings. Mild multilevel degenerative changes of the spine. Review of the MIP images confirms the above findings. IMPRESSION: 1. No evidence of acute abnormality.  No aortic dissection. 2. No significant arterial stenosis. 3. Distended bladder. Electronically Signed   By: Margaretha Sheffield MD   On: 06/07/2020 14:26    Procedures Procedures (including critical care time)  Medications Ordered in ED Medications  iohexol (OMNIPAQUE) 300 MG/ML solution 100 mL (has no administration in time range)    acetaminophen (TYLENOL) tablet 1,000 mg (1,000 mg Oral Given 06/07/20 1344)  iohexol (OMNIPAQUE) 350 MG/ML injection 100 mL (100 mLs Intravenous Contrast Given 06/07/20 1351)    ED Course  I have reviewed the triage vital signs and the nursing notes.  Pertinent labs & imaging results that were available during my care of the patient were reviewed by me and considered in my medical decision making (see chart for details).    MDM Rules/Calculators/A&P                          Patient presents with intermittent chest pressure and back discomfort since yesterday afternoon.  Patient's risk factors are age and hyperlipidemia.  Patient feels improved since arrival.  Plan for ACS evaluation,  delta troponin negative and symptoms minimal.  Paged cardiology to arrange stress test and further recommendations.  With radiation to the back CT scan obtained no dissection.  Remainder of blood work unremarkable. EKG no acute ischemic findings.  Spoke with Dr. Harl Bowie cardiology and with exertional component and age plan for observation to the hospitalist for serial troponins and possible stress test tomorrow. Final Clinical Impression(s) / ED Diagnoses Final diagnoses:  Acute chest pain    Rx / DC Orders ED Discharge Orders    None       Elnora Morrison, MD 06/07/20 1555

## 2020-06-07 NOTE — ED Notes (Signed)
Pt in bed, pt reports decreased pain.  °

## 2020-06-07 NOTE — ED Triage Notes (Addendum)
Pt presents to ED with complaints of intermittent chest pressure started yesterday about 1500 after chasing chickens. Pt c/o mid chest pressure and back pressure. Pt denies SOB, dizziness, nausea. Pt was given ASA 324 mg and 2 nitro PTA

## 2020-06-07 NOTE — ED Notes (Signed)
Pt in bed, pt reports 3/10 back pain, states that she doesn't want anything for pain at this time

## 2020-06-07 NOTE — Plan of Care (Signed)

## 2020-06-07 NOTE — Discharge Instructions (Signed)
Follow-up per cardiology ?

## 2020-06-08 ENCOUNTER — Observation Stay (HOSPITAL_BASED_OUTPATIENT_CLINIC_OR_DEPARTMENT_OTHER): Payer: Medicare HMO

## 2020-06-08 ENCOUNTER — Other Ambulatory Visit: Payer: Self-pay

## 2020-06-08 DIAGNOSIS — E785 Hyperlipidemia, unspecified: Secondary | ICD-10-CM

## 2020-06-08 DIAGNOSIS — R079 Chest pain, unspecified: Secondary | ICD-10-CM

## 2020-06-08 DIAGNOSIS — G43909 Migraine, unspecified, not intractable, without status migrainosus: Secondary | ICD-10-CM | POA: Diagnosis not present

## 2020-06-08 DIAGNOSIS — R030 Elevated blood-pressure reading, without diagnosis of hypertension: Secondary | ICD-10-CM | POA: Diagnosis not present

## 2020-06-08 DIAGNOSIS — K589 Irritable bowel syndrome without diarrhea: Secondary | ICD-10-CM | POA: Diagnosis not present

## 2020-06-08 DIAGNOSIS — F419 Anxiety disorder, unspecified: Secondary | ICD-10-CM | POA: Diagnosis not present

## 2020-06-08 DIAGNOSIS — D510 Vitamin B12 deficiency anemia due to intrinsic factor deficiency: Secondary | ICD-10-CM | POA: Diagnosis not present

## 2020-06-08 DIAGNOSIS — R69 Illness, unspecified: Secondary | ICD-10-CM | POA: Diagnosis not present

## 2020-06-08 DIAGNOSIS — Z79899 Other long term (current) drug therapy: Secondary | ICD-10-CM | POA: Diagnosis not present

## 2020-06-08 DIAGNOSIS — Z20822 Contact with and (suspected) exposure to covid-19: Secondary | ICD-10-CM | POA: Diagnosis not present

## 2020-06-08 LAB — ECHOCARDIOGRAM COMPLETE
AV Mean grad: 3.2 mmHg
AV Peak grad: 5.7 mmHg
Ao pk vel: 1.19 m/s
Area-P 1/2: 1.98 cm2
Height: 63 in
S' Lateral: 1.68 cm
Weight: 2275.15 oz

## 2020-06-08 LAB — NM MYOCAR MULTI W/SPECT W/WALL MOTION / EF
LV dias vol: 48 mL (ref 46–106)
LV sys vol: 29 mL
Peak HR: 110 {beats}/min
RATE: 0.42
Rest HR: 63 {beats}/min
SDS: 1
SRS: 2
SSS: 3
TID: 1.07

## 2020-06-08 LAB — LIPID PANEL
Cholesterol: 196 mg/dL (ref 0–200)
HDL: 50 mg/dL (ref >40)
LDL Cholesterol: 113 mg/dL — ABNORMAL HIGH (ref 0–99)
Total CHOL/HDL Ratio: 3.9 RATIO
Triglycerides: 164 mg/dL — ABNORMAL HIGH (ref <150)
VLDL: 33 mg/dL (ref 0–40)

## 2020-06-08 LAB — TROPONIN I (HIGH SENSITIVITY): Troponin I (High Sensitivity): 3 ng/L (ref <18)

## 2020-06-08 LAB — HIV ANTIBODY (ROUTINE TESTING W REFLEX): HIV Screen 4th Generation wRfx: NONREACTIVE

## 2020-06-08 MED ORDER — NITROGLYCERIN 0.4 MG SL SUBL: 0.4000 mg | SUBLINGUAL_TABLET | SUBLINGUAL | 0 refills | Status: DC | PRN

## 2020-06-08 MED ORDER — TECHNETIUM TC 99M TETROFOSMIN IV KIT
30.0000 | PACK | Freq: Once | INTRAVENOUS | Status: AC | PRN
  Administered 2020-06-08: 32 via INTRAVENOUS

## 2020-06-08 MED ORDER — REGADENOSON 0.4 MG/5ML IV SOLN
INTRAVENOUS | Status: AC
  Administered 2020-06-08: 0.4 mg via INTRAVENOUS
  Filled 2020-06-08: qty 5

## 2020-06-08 MED ORDER — ONDANSETRON HCL 4 MG PO TABS: 4.0000 mg | ORAL_TABLET | Freq: Four times a day (QID) | ORAL | 0 refills | Status: DC | PRN

## 2020-06-08 MED ORDER — SODIUM CHLORIDE FLUSH 0.9 % IV SOLN
INTRAVENOUS | Status: AC
  Administered 2020-06-08: 10 mL via INTRAVENOUS
  Filled 2020-06-08: qty 10

## 2020-06-08 MED ORDER — TECHNETIUM TC 99M TETROFOSMIN IV KIT
10.0000 | PACK | Freq: Once | INTRAVENOUS | Status: AC | PRN
  Administered 2020-06-08: 10 via INTRAVENOUS

## 2020-06-08 MED ORDER — ASPIRIN 81 MG PO TBEC
81.0000 mg | DELAYED_RELEASE_TABLET | Freq: Every day | ORAL | 0 refills | Status: DC
Start: 2020-06-09 — End: 2021-02-08

## 2020-06-08 NOTE — Discharge Summary (Signed)
Physician Discharge Summary  Jasmin Butler VCB:[id number] DOB: [birth year] DOA: 06/07/2020  PCP: Jasmin Norlander, DO  Admit date: 06/07/2020 Discharge date: 06/08/2020  Admitted From: Home Disposition: Home  Recommendations for Outpatient Follow-up:  1. Follow up with PCP in 1 week  2. Outpatient follow-up with cardiology 3. Follow up in ED if symptoms worsen or new appear   Home Health: No Equipment/Devices: None  Discharge Condition: Stable CODE STATUS: Full Diet recommendation: Heart healthy  Brief/Interim Summary: 69 y.o. female with medical history significant for depression, IBS, thyroid disease presented with intermittent chest pain.  On presentation, troponin levels were 3 and 4 subsequently.  CTA of chest abdomen and pelvis were negative for acute dissection or any acute abnormality.  EKG did not show any acute changes.  Cardiology was consulted.  Patient underwent stress test today which was normal.  Cardiology has cleared the patient for discharge.  She will be discharged home with outpatient follow-up with cardiology.  Discharge Diagnoses:   Chest pain -No history of CAD.  Troponins 3 and 4 on presentation.  EKG without any ischemic changes.  CTA chest was negative for dissection or PE. -Cardiology evaluation appreciated.  Status post stress test today  which was normal.  Echo showed no WMAs and normal LV function.  Cardiology has cleared the patient for discharge.  She will be discharged home with outpatient follow-up with cardiology.  Continue aspirin 81 mg daily along with statin  Hyperlipidemia -Continue statin  Hypothyroidism -Continue Synthroid   Discharge Instructions    Allergies as of 06/08/2020      Reactions   Penicillins Rash   Did it involve swelling of the face/tongue/throat, SOB, or low BP? Yes Did it involve sudden or severe rash/hives, skin peeling, or any reaction on the inside of your mouth or nose? Unk Did you need to seek medical  attention at a hospital or doctor's office? Yes When did it last happen? 30 years ago If all above answers are "NO", may proceed with cephalosporin use.      Medication List    STOP taking these medications   azithromycin 250 MG tablet Commonly known as: ZITHROMAX   busPIRone 10 MG tablet Commonly known as: BUSPAR   magic mouthwash w/lidocaine Soln     TAKE these medications   aspirin 81 MG EC tablet Take 1 tablet (81 mg total) by mouth daily. Swallow whole. Start taking on: June 09, 2020   cyanocobalamin 1000 MCG/ML injection Commonly known as: (VITAMIN B-12) Inject 1,000 mcg into the muscle every 30 (thirty) days.   EPINEPHrine 0.3 mg/0.3 mL Soaj injection Commonly known as: EpiPen 2-Pak Inject 0.3 mLs (0.3 mg total) into the muscle as needed for anaphylaxis.   levothyroxine 75 MCG tablet Commonly known as: SYNTHROID TAKE 1 TABLET BY MOUTH EVERY DAY   loratadine 10 MG tablet Commonly known as: CLARITIN Take 1 tablet (10 mg total) by mouth daily.   nitroGLYCERIN 0.4 MG SL tablet Commonly known as: NITROSTAT Place 1 tablet (0.4 mg total) under the tongue every 5 (five) minutes as needed for chest pain.   ondansetron 4 MG tablet Commonly known as: ZOFRAN Take 1 tablet (4 mg total) by mouth every 6 (six) hours as needed for nausea.   pantoprazole 40 MG tablet Commonly known as: PROTONIX Take 1 tablet (40 mg total) by mouth daily.   pravastatin 40 MG tablet Commonly known as: PRAVACHOL TAKE 1 TABLET BY MOUTH EVERY DAY        Follow-up  Information    Schedule an appointment as soon as possible for a visit  with Jasmin Lenis, MD.   Specialty: Cardiology Contact information: [address] [place] [address] [phone]        Jasmin Norlander, DO. Schedule an appointment as soon as possible for a visit in 1 week(s).   Specialty: Family Medicine Contact information: [address] [place] [address] [phone]               Allergies  Allergen Reactions  . Penicillins Rash    Did it involve swelling of the face/tongue/throat, SOB, or low BP? Yes Did it involve sudden or severe rash/hives, skin peeling, or any reaction on the inside of your mouth or nose? Unk Did you need to seek medical attention at a hospital or doctor's office? Yes When did it last happen? 30 years ago If all above answers are "NO", may proceed with cephalosporin use.     Consultations:  Cardiology   Procedures/Studies: CT Angio Chest/Abd/Pel for Dissection W and/or Wo Contrast  Result Date: 06/07/2020 CLINICAL DATA:  Chest pain/back pain. Evaluate for aortic dissection. EXAM: CT ANGIOGRAPHY CHEST, ABDOMEN AND PELVIS TECHNIQUE: Non-contrast CT of the chest was initially obtained. Multidetector CT imaging through the chest, abdomen and pelvis was performed using the standard protocol during bolus administration of intravenous contrast. Multiplanar reconstructed images and MIPs were obtained and reviewed to evaluate the vascular anatomy. CONTRAST:  133mL OMNIPAQUE IOHEXOL 350 MG/ML SOLN COMPARISON:  None. FINDINGS: CTA CHEST FINDINGS Cardiovascular: Preferential opacification of the thoracic aorta. No evidence of thoracic aortic aneurysm or dissection. Normal heart size. No pericardial effusion. Mediastinum/Nodes: No enlarged mediastinal, hilar, or axillary lymph nodes. Thyroid gland, trachea, and esophagus demonstrate no significant findings. Lungs/Pleura: Dependent atelectasis. Musculoskeletal: Review of the MIP images confirms the above findings. CTA ABDOMEN AND PELVIS FINDINGS VASCULAR Aorta: Normal caliber aorta without aneurysm, dissection, vasculitis or significant stenosis. Mild atherosclerosis. Celiac: Patent without evidence of aneurysm, dissection, vasculitis or significant stenosis. Mild calcific atherosclerosis. SMA: Patent without evidence of aneurysm, dissection, vasculitis or significant stenosis. Renals: Both renal arteries are  patent without evidence of aneurysm, dissection, vasculitis, fibromuscular dysplasia or significant stenosis. IMA: Patent without evidence of aneurysm, dissection, vasculitis or significant stenosis. Inflow: Patent without evidence of aneurysm, dissection, vasculitis or significant stenosis. Veins: No obvious venous abnormality within the limitations of this arterial phase study. Review of the MIP images confirms the above findings. NON-VASCULAR Hepatobiliary: No focal liver abnormality is seen. Cholecystectomy. Borderline extrahepatic biliary dilatation, likely related to post-cholecystectomy state. Pancreas: Unremarkable. No pancreatic ductal dilatation or surrounding inflammatory changes. Spleen: Normal in size without focal abnormality.  Splenule. Adrenals/Urinary Tract: Adrenal glands are unremarkable. Kidneys are normal, without renal calculi, focal lesion, or hydronephrosis. Symmetric renal enhancement. Mild fullness of the ureters is likely related to a distended bladder. Stomach/Bowel: Stomach is within normal limits. Appendix appears normal. No evidence of bowel wall thickening, distention, or inflammatory changes. Moderate colonic stool burden. Lymphatic: No lymphadenopathy. Reproductive: Status post hysterectomy. No adnexal masses. Other: No abdominal wall hernia or abnormality. No abdominopelvic ascites. Musculoskeletal: No acute or significant osseous findings. Mild multilevel degenerative changes of the spine. Review of the MIP images confirms the above findings. IMPRESSION: 1. No evidence of acute abnormality.  No aortic dissection. 2. No significant arterial stenosis. 3. Distended bladder. Electronically Signed   By: Margaretha Sheffield MD   On: 06/07/2020 14:26    Echo and Carlton Adam: Normal   Subjective: Patient seen  and examined at bedside.  Feels better but still has some intermittent chest pressure.  No worsening shortness of breath reported.  No fever, vomiting or abdominal pain  reported.  Discharge Exam: Vitals:   06/08/20 0340 06/08/20 0943  BP: 111/69 134/73  Pulse: 62 (!) 57  Resp: 15 18  Temp: 97.9 F (36.6 C) 98.1 F (36.7 C)  SpO2: 95% 95%    General: Pt is alert, awake, not in acute distress Cardiovascular: Intermittent mild bradycardia, S1/S2 + Respiratory: bilateral decreased breath sounds at bases Abdominal: Soft, NT, ND, bowel sounds + Extremities: no edema, no cyanosis    The results of significant diagnostics from this hospitalization (including imaging, microbiology, ancillary and laboratory) are listed below for reference.     Microbiology: Recent Results (from the past 240 hour(s))  SARS Coronavirus 2 by RT PCR (hospital order, performed in [facility] hospital lab) Nasopharyngeal Nasopharyngeal Swab     Status: None   Collection Time: 06/07/20  1:47 PM   Specimen: Nasopharyngeal Swab  Result Value Ref Range Status   SARS Coronavirus 2 NEGATIVE NEGATIVE Final    Comment: (NOTE) SARS-CoV-2 target nucleic acids are NOT DETECTED.  The SARS-CoV-2 RNA is generally detectable in upper and lower respiratory specimens during the acute phase of infection. The lowest concentration of SARS-CoV-2 viral copies this assay can detect is 250 copies / mL. A negative result does not preclude SARS-CoV-2 infection and should not be used as the sole basis for treatment or other patient management decisions.  A negative result may occur with improper specimen collection / handling, submission of specimen other than nasopharyngeal swab, presence of viral mutation(s) within the areas targeted by this assay, and inadequate number of viral copies (<250 copies / mL). A negative result must be combined with clinical observations, patient history, and epidemiological information.  Fact Sheet for Patients:   StrictlyIdeas.no  Fact Sheet for Healthcare Providers: BankingDealers.co.za  This test is not yet  approved or  cleared by the [place] FDA and has been authorized for detection and/or diagnosis of SARS-CoV-2 by FDA under an Emergency Use Authorization (EUA).  This EUA will remain in effect (meaning this test can be used) for the duration of the COVID-19 declaration under Section 564(b)(1) of the Act, 21 U.S.C. section 360bbb-3(b)(1), unless the authorization is terminated or revoked sooner.  Performed at [facility], [address] [place], [place] [address]      Labs: BNP (last 3 results) No results for input(s): BNP in the last 8760 hours. Basic Metabolic Panel: Recent Labs  Lab 06/07/20 1223  NA 138  K 3.7  CL 102  CO2 25  GLUCOSE 103*  BUN 11  CREATININE 0.69  CALCIUM 9.1   Liver Function Tests: No results for input(s): AST, ALT, ALKPHOS, BILITOT, PROT, ALBUMIN in the last 168 hours. No results for input(s): LIPASE, AMYLASE in the last 168 hours. No results for input(s): AMMONIA in the last 168 hours. CBC: Recent Labs  Lab 06/07/20 1223  WBC 7.9  HGB 12.1  HCT 38.2  MCV 86.8  PLT 236   Cardiac Enzymes: No results for input(s): CKTOTAL, CKMB, CKMBINDEX, TROPONINI in the last 168 hours. BNP: Invalid input(s): POCBNP CBG: No results for input(s): GLUCAP in the last 168 hours. D-Dimer No results for input(s): DDIMER in the last 72 hours. Hgb A1c No results for input(s): HGBA1C in the last 72 hours. Lipid Profile Recent Labs    06/08/20 0751  CHOL 196  HDL 50  LDLCALC 113*  TRIG 164*  CHOLHDL 3.9   Thyroid function studies No results for input(s): TSH, T4TOTAL, T3FREE, THYROIDAB in the last 72 hours.  Invalid input(s): FREET3 Anemia work up No results for input(s): VITAMINB12, FOLATE, FERRITIN, TIBC, IRON, RETICCTPCT in the last 72 hours. Urinalysis No results found for: COLORURINE, APPEARANCEUR, [place], [place], [place], [place], [place], [place], PROTEINUR, UROBILINOGEN, NITRITE, LEUKOCYTESUR Sepsis Labs Invalid input(s):  PROCALCITONIN,  WBC,  LACTICIDVEN Microbiology Recent Results (from the past 240 hour(s))  SARS Coronavirus 2 by RT PCR (hospital order, performed in [facility] hospital lab) Nasopharyngeal Nasopharyngeal Swab     Status: None   Collection Time: 06/07/20  1:47 PM   Specimen: Nasopharyngeal Swab  Result Value Ref Range Status   SARS Coronavirus 2 NEGATIVE NEGATIVE Final    Comment: (NOTE) SARS-CoV-2 target nucleic acids are NOT DETECTED.  The SARS-CoV-2 RNA is generally detectable in upper and lower respiratory specimens during the acute phase of infection. The lowest concentration of SARS-CoV-2 viral copies this assay can detect is 250 copies / mL. A negative result does not preclude SARS-CoV-2 infection and should not be used as the sole basis for treatment or other patient management decisions.  A negative result may occur with improper specimen collection / handling, submission of specimen other than nasopharyngeal swab, presence of viral mutation(s) within the areas targeted by this assay, and inadequate number of viral copies (<250 copies / mL). A negative result must be combined with clinical observations, patient history, and epidemiological information.  Fact Sheet for Patients:   StrictlyIdeas.no  Fact Sheet for Healthcare Providers: BankingDealers.co.za  This test is not yet approved or  cleared by the [place] FDA and has been authorized for detection and/or diagnosis of SARS-CoV-2 by FDA under an Emergency Use Authorization (EUA).  This EUA will remain in effect (meaning this test can be used) for the duration of the COVID-19 declaration under Section 564(b)(1) of the Act, 21 U.S.C. section 360bbb-3(b)(1), unless the authorization is terminated or revoked sooner.  Performed at [facility], [address] [place], [place] [address]      Time coordinating discharge: 35 minutes  SIGNED:   Aline August,  MD  [facility] 06/08/2020, 10:35 AM

## 2020-06-08 NOTE — Progress Notes (Signed)
*  PRELIMINARY RESULTS* Echocardiogram 2D Echocardiogram has been performed.  Leavy Cella 06/08/2020, 1:40 PM

## 2020-06-08 NOTE — Consult Note (Addendum)
Cardiology Consult    Patient ID: Jasmin Butler; [id number]; June 14, 1951   Admit date: 06/07/2020 Date of Consult: 06/08/2020  Primary Care Provider: [name], DO Primary Cardiologist: Previously evaluated by Dr. Percival Spanish in 2016  Patient Profile    Jasmin Butler is a 69 y.o. female with past medical history of chest pain (s/p Coronary CT in 2015 with Calcium Score of 0), HLD and hypothyroidism who is being seen today for the evaluation of chest pain at the request of Dr. Denton Brick.   History of Present Illness    Jasmin Butler was last evaluated by the Cardiology service in 2016 and had recently undergone a Coronary CT which showed a calcium score of 0.  She denied any recurrent symptoms at the time of her follow-up visit and further cardiac testing was not indicated.  She did have mild ascending aortic dilatation and repeat imaging was recommended in 24 months.  She presented to [facility] ED on 06/07/2020 for evaluation of chest pain which had started the day prior to arrival. In talking with the patient today, she reports being in her usual state of health until Tuesday when she developed chest pressure while chasing chickens and trying to gather them in a coop. She describes this as a pressure along her precordium which radiated into her back. Denies any associated nausea, vomiting or diaphoresis but did feel dizzy. Her pain lasted for over 5 hours and resolved prior to going to bed. She awoke the following morning with recurrent pain which was most notable along her shoulder blades, therefore she called her PCP and was encouraged to go to the ED. She describes intermittent pain along her shoulder blades which has improved with Tylenol. Not associated with exertion or positional changes. No recurrent chest pain. No recent orthopnea, PND or edema. She had underwent cholecystectomy in the past.   Initial labs show WBC 7.9, Hgb 12.1, platelets 236, Na+ 138, K+ 3.7 and creatinine 0.69.  Initial and delta HS Troponin values negative at 3 and 4. COVID negative.  CTA chest showed no evidence of acute abnormalities or evidence of aortic dissection. EKG shows NSR, HR 73 with LAD and no acute ST abnormalities when compared to prior tracings.    Past Medical History:  Diagnosis Date  . Allergy to alpha-gal   . Anxiety   . Cataract   . Depression    "couple times/yr" (09/29/2014)  . GERD (gastroesophageal reflux disease) 2005  . Headache    "weekly" (09/29/2014)  . History of hiatal hernia   . Hyperlipidemia   . IBS (irritable bowel syndrome)   . Migraine    "2-3 times/yr" (09/29/2014)  . Pernicious anemia   . Pneumonia 1980's   "double"  . Thyroid disease     Past Surgical History:  Procedure Laterality Date  . ABDOMINAL HYSTERECTOMY  1985  . CARDIAC CATHETERIZATION  ~ 2005  . CATARACT EXTRACTION W/ INTRAOCULAR LENS  IMPLANT, BILATERAL Bilateral 2000's  . CHOLECYSTECTOMY OPEN  ~ 1990  . EYE SURGERY    . TUBAL LIGATION  ~ 1983     Home Medications:  Prior to Admission medications   Medication Sig Start Date End Date Taking? Authorizing Provider  cyanocobalamin (,VITAMIN B-12,) 1000 MCG/ML injection Inject 1,000 mcg into the muscle every 30 (thirty) days.  11/09/14  Yes [provider]  EPINEPHrine (EPIPEN 2-PAK) 0.3 mg/0.3 mL IJ SOAJ injection Inject 0.3 mLs (0.3 mg total) into the muscle as needed for anaphylaxis. 02/23/19  Yes Terald Sleeper, PA-C  levothyroxine (SYNTHROID) 75 MCG tablet TAKE 1 TABLET BY MOUTH EVERY DAY 04/18/20  Yes Gottschalk, Ashly M, DO  loratadine (CLARITIN) 10 MG tablet Take 1 tablet (10 mg total) by mouth daily. 04/24/20  Yes Gottschalk, Ashly M, DO  pravastatin (PRAVACHOL) 40 MG tablet TAKE 1 TABLET BY MOUTH EVERY DAY 03/15/20  Yes Gottschalk, Ashly M, DO  azithromycin (ZITHROMAX) 250 MG tablet Take 2 tablets today, then take 1 tablet daily until gone. Patient not taking: Reported on 06/07/2020 04/24/20   [name], DO    busPIRone (BUSPAR) 10 MG tablet TAKE 1 TABLET BY MOUTH THREE TIMES A DAY Patient not taking: Reported on 06/07/2020 11/29/19   Terald Sleeper, PA-C  magic mouthwash w/lidocaine SOLN Gargle and spit 72mL every 6 hours as needed for sore throat. 38mL lidocaine, 15mL nystatin, 60mg  hydrocortisone tab, qs benadryl total 469mL. Patient not taking: Reported on 06/07/2020 04/24/20   [name], DO  pantoprazole (PROTONIX) 40 MG tablet Take 1 tablet (40 mg total) by mouth daily. Patient not taking: Reported on 06/07/2020 02/08/20   [name], DO    Inpatient Medications: Scheduled Meds: . aspirin EC  81 mg Oral Daily  . enoxaparin (LOVENOX) injection  40 mg Subcutaneous Q24H  . levothyroxine  75 mcg Oral Daily  . loratadine  10 mg Oral Daily  . pravastatin  40 mg Oral Daily   Continuous Infusions:  PRN Meds: acetaminophen **OR** acetaminophen, nitroGLYCERIN, ondansetron **OR** ondansetron (ZOFRAN) IV, polyethylene glycol  Allergies:    Allergies  Allergen Reactions  . Penicillins Rash    Did it involve swelling of the face/tongue/throat, SOB, or low BP? Yes Did it involve sudden or severe rash/hives, skin peeling, or any reaction on the inside of your mouth or nose? Unk Did you need to seek medical attention at a hospital or doctor's office? Yes When did it last happen? 30 years ago If all above answers are "NO", may proceed with cephalosporin use.     Social History:   Social History   Socioeconomic History  . Marital status: Widowed    Spouse name: Not on file  . Number of children: 3  . Years of education: GED  . Highest education level: GED or equivalent  Occupational History  . Occupation: Retired- Engineer, materials  Tobacco Use  . Smoking status: Never Smoker  . Smokeless tobacco: Never Used  Vaping Use  . Vaping Use: Never used  Substance and Sexual Activity  . Alcohol use: No  . Drug use: No  . Sexual activity: Not Currently  Other Topics Concern  .  Not on file  Social History Narrative  . Not on file   Social Determinants of Health   Financial Resource Strain:   . Difficulty of Paying Living Expenses: Not on file  Food Insecurity:   . Worried About Charity fundraiser in the Last Year: Not on file  . Ran Out of Food in the Last Year: Not on file  Transportation Needs:   . Lack of Transportation (Medical): Not on file  . Lack of Transportation (Non-Medical): Not on file  Physical Activity:   . Days of Exercise per Week: Not on file  . Minutes of Exercise per Session: Not on file  Stress:   . Feeling of Stress : Not on file  Social Connections:   . Frequency of Communication with Friends and Family: Not on file  . Frequency of Social Gatherings with  Friends and Family: Not on file  . Attends Religious Services: Not on file  . Active Member of Clubs or Organizations: Not on file  . Attends Archivist Meetings: Not on file  . Marital Status: Not on file  Intimate Partner Violence:   . Fear of Current or Ex-Partner: Not on file  . Emotionally Abused: Not on file  . Physically Abused: Not on file  . Sexually Abused: Not on file     Family History:    Family History  Problem Relation Age of Onset  . Leukemia Father   . Cancer Father        leukemia  . Hypertension Mother   . Stroke Mother 59  . Thyroid disease Mother   . Arthritis Mother   . Cancer Brother        4 different cancers  . Asthma Brother   . Birth defects Son       Review of Systems    General:  No chills, fever, night sweats or weight changes.  Cardiovascular:  No dyspnea on exertion, edema, orthopnea, palpitations, paroxysmal nocturnal dyspnea. Positive for chest pain.  Dermatological: No rash, lesions/masses Respiratory: No cough, dyspnea Urologic: No hematuria, dysuria Abdominal:   No nausea, vomiting, diarrhea, bright red blood per rectum, melena, or hematemesis Neurologic:  No visual changes, wkns, changes in mental status. All  other systems reviewed and are otherwise negative except as noted above.  Physical Exam/Data    Vitals:   06/07/20 1951 06/07/20 2035 06/08/20 0007 06/08/20 0340  BP: 132/69 (!) 168/86 138/77 111/69  Pulse: (!) 53 (!) 56 64 62  Resp: 16 16 18 15   Temp: 98.3 F (36.8 C) 98 F (36.7 C) 98 F (36.7 C) 97.9 F (36.6 C)  TempSrc: Oral Oral Oral Oral  SpO2: 96% 100% 97% 95%  Weight:  64.5 kg    Height:  5\' 3"  (1.6 m)     No intake or output data in the 24 hours ending 06/08/20 0834 Filed Weights   06/07/20 1220 06/07/20 2035  Weight: 63.5 kg 64.5 kg   Body mass index is 25.19 kg/m.   General: Pleasant female appearing in NAD Psych: Normal affect. Neuro: Alert and oriented X 3. Moves all extremities spontaneously. HEENT: Normal  Neck: Supple without bruits or JVD. Lungs:  Resp regular and unlabored, CTA without wheezing or rales. Heart: RRR no s3, s4, or murmurs. Abdomen: Soft, non-tender, non-distended, BS + x 4.  Extremities: No clubbing, cyanosis or edema. DP/PT/Radials 2+ and equal bilaterally.   EKG:  The EKG was personally reviewed and demonstrates: NSR, HR 73 with LAD and no acute ST abnormalities when compared to prior tracings.   Telemetry:  Telemetry was personally reviewed and demonstrates: NSR, HR in 50's to 60's. No significant arrhythmias.    Labs/Studies     Relevant CV Studies:  Coronary CT: 09/2014 Calcium Score:  Coronary Arteries:  Left dominant with no anomalies  LM: Normal  LAD:  Normal  D1:  Large and normal  D2:  Small and normal  Circumflex:  Large Left dominant and normal  OM1:  Large vessel normal  PDA/PLA left sided normal  RCA:  Small non dominant and normal  IMPRESSION:  1) Calcium Score 0  2) Normal Left dominant coronary arteries  3) Mild ascending aortic root dilatation  4) Linear basilar opacities /scarring see report by Radiology  Laboratory Data:  Chemistry Recent Labs  Lab 06/07/20 1223  NA  138  K  3.7  CL 102  CO2 25  GLUCOSE 103*  BUN 11  CREATININE 0.69  CALCIUM 9.1  GFRNONAA >60  GFRAA >60  ANIONGAP 11    No results for input(s): PROT, ALBUMIN, AST, ALT, ALKPHOS, BILITOT in the last 168 hours. Hematology Recent Labs  Lab 06/07/20 1223  WBC 7.9  RBC 4.40  HGB 12.1  HCT 38.2  MCV 86.8  MCH 27.5  MCHC 31.7  RDW 13.5  PLT 236   Cardiac EnzymesNo results for input(s): TROPONINI in the last 168 hours. No results for input(s): TROPIPOC in the last 168 hours.  BNPNo results for input(s): BNP, PROBNP in the last 168 hours.  DDimer No results for input(s): DDIMER in the last 168 hours.  Radiology/Studies:  CT Angio Chest/Abd/Pel for Dissection W and/or Wo Contrast  Result Date: 06/07/2020 CLINICAL DATA:  Chest pain/back pain. Evaluate for aortic dissection. EXAM: CT ANGIOGRAPHY CHEST, ABDOMEN AND PELVIS TECHNIQUE: Non-contrast CT of the chest was initially obtained. Multidetector CT imaging through the chest, abdomen and pelvis was performed using the standard protocol during bolus administration of intravenous contrast. Multiplanar reconstructed images and MIPs were obtained and reviewed to evaluate the vascular anatomy. CONTRAST:  171mL OMNIPAQUE IOHEXOL 350 MG/ML SOLN COMPARISON:  None. FINDINGS: CTA CHEST FINDINGS Cardiovascular: Preferential opacification of the thoracic aorta. No evidence of thoracic aortic aneurysm or dissection. Normal heart size. No pericardial effusion. Mediastinum/Nodes: No enlarged mediastinal, hilar, or axillary lymph nodes. Thyroid gland, trachea, and esophagus demonstrate no significant findings. Lungs/Pleura: Dependent atelectasis. Musculoskeletal: Review of the MIP images confirms the above findings. CTA ABDOMEN AND PELVIS FINDINGS VASCULAR Aorta: Normal caliber aorta without aneurysm, dissection, vasculitis or significant stenosis. Mild atherosclerosis. Celiac: Patent without evidence of aneurysm, dissection, vasculitis or significant  stenosis. Mild calcific atherosclerosis. SMA: Patent without evidence of aneurysm, dissection, vasculitis or significant stenosis. Renals: Both renal arteries are patent without evidence of aneurysm, dissection, vasculitis, fibromuscular dysplasia or significant stenosis. IMA: Patent without evidence of aneurysm, dissection, vasculitis or significant stenosis. Inflow: Patent without evidence of aneurysm, dissection, vasculitis or significant stenosis. Veins: No obvious venous abnormality within the limitations of this arterial phase study. Review of the MIP images confirms the above findings. NON-VASCULAR Hepatobiliary: No focal liver abnormality is seen. Cholecystectomy. Borderline extrahepatic biliary dilatation, likely related to post-cholecystectomy state. Pancreas: Unremarkable. No pancreatic ductal dilatation or surrounding inflammatory changes. Spleen: Normal in size without focal abnormality.  Splenule. Adrenals/Urinary Tract: Adrenal glands are unremarkable. Kidneys are normal, without renal calculi, focal lesion, or hydronephrosis. Symmetric renal enhancement. Mild fullness of the ureters is likely related to a distended bladder. Stomach/Bowel: Stomach is within normal limits. Appendix appears normal. No evidence of bowel wall thickening, distention, or inflammatory changes. Moderate colonic stool burden. Lymphatic: No lymphadenopathy. Reproductive: Status post hysterectomy. No adnexal masses. Other: No abdominal wall hernia or abnormality. No abdominopelvic ascites. Musculoskeletal: No acute or significant osseous findings. Mild multilevel degenerative changes of the spine. Review of the MIP images confirms the above findings. IMPRESSION: 1. No evidence of acute abnormality.  No aortic dissection. 2. No significant arterial stenosis. 3. Distended bladder. Electronically Signed   By: Margaretha Sheffield MD   On: 06/07/2020 14:26     Assessment & Plan    1. Chest Pain with Mixed Features - She  presented with chest pressure which started with exertion but symptoms lasted for over 5 hours. No recurrent chest pain but she has experienced pain in her shoulder blades bilaterally which has been relieved with Tylenol.  -  HS Troponin values have been negative and EKG without acute ST abnormalities. CTA Chest shows no evidence of acute abnormalities or evidence of aortic dissection. Echocardiogram is pending to assess LV function and wall motion.  - Her last ischemic evaluation was a Coronary CT in 2015 which showed a Calcium Score of 0. Given her symptoms, will plan to obtain a [place] for ischemic evaluation. She has been NPO since midnight.   2. Elevated BP - She does not have a diagnosis of HTN but BP was elevated when checked at home. Has been variable at 111/69 - 168/86 since admission. Will continue to follow to see if anti-hypertensive therapy is indicated.   3. HLD - Followed by her PCP as an outpatient. She has been continued on PTA Pravastatin 40mg  daily.    For questions or updates, please contact [place] Please consult [email or web address] for contact info under Cardiology/STEMI.  Signed, Erma Heritage, PA-C 06/08/2020, 8:34 AM Pager: [phone]   Attending note Patient seen and discussed with PA Ahmed Prima, I agree with her documentation. Presented with chest pain mixed in description and SOB. No objective evidence of ischemia by EKG or enzymes. Echo shows normal LV function and no WMAs. Lexiscan is normal without evidence of any perfusion defects. Her cardiac workup is benign, no plans for any further cardiac testing at this time. We will sign off inpatient care.    Carlyle Dolly MD

## 2020-06-09 ENCOUNTER — Telehealth: Payer: Self-pay | Admitting: *Deleted

## 2020-06-09 NOTE — Telephone Encounter (Signed)
TRANSITIONAL CARE MANAGEMENT TELEPHONE OUTREACH NOTE   Contact Date: 06/09/2020 Contacted By: Jasmin Mainland, LPN   DISCHARGE INFORMATION Date of Discharge:06/08/2020  Discharge Facility: [facility] Discharge Diagnosis:Chest Pain, Unspecified   Outpatient Follow Up Recommendations (copied from discharge summary) 1. Follow up with PCP in 1 week  2. Outpatient follow-up with cardiology 3. Follow up in ED if symptoms worsen or new appear  Jasmin Butler is a female primary care patient of Jasmin Norlander, DO. An outgoing telephone call was made today and I spoke with Jasmin Butler.  Jasmin Butler condition(s) and treatment(s) were discussed. An opportunity to ask questions was provided and all were answered or forwarded as appropriate.    ACTIVITIES OF DAILY LIVING  Jasmin Butler lives alone and she can perform ADLs independently. her primary caregiver is herself. she is able to depend on her primary caregiver(s) for consistent help. Transportation to appointments, to pick up medications, and to run errands is not a problem.  (Consider referral to Jasmin Butler if transportation or a consistent caregiver is a problem)   Fall Risk Fall Risk  04/24/2020 08/10/2019  Falls in the past year? 0 0    low Jasmin Butler Modifications/Assistive Devices Wheelchair: No Cane: No Ramp: No Bedside Toilet: No Hospital Bed:  No Other: No   Home Health Services she is not receiving home health services.     MEDICATION RECONCILIATION  Ms. Therrell has been able to pick-up all prescribed discharge medications from the pharmacy.   A post discharge medication reconciliation was performed and the complete medication list was reviewed with the patient/caregiver and is current as of 06/09/2020. Changes highlighted below.  Discontinued Medications STOP taking these medications   azithromycin 250 MG tablet Commonly known as: ZITHROMAX   busPIRone 10 MG tablet Commonly known as:  BUSPAR   magic mouthwash w/lidocaine Soln     Current Medication List Allergies as of 06/09/2020      Reactions   Penicillins Rash   Did it involve swelling of the face/tongue/throat, SOB, or low BP? Yes Did it involve sudden or severe rash/hives, skin peeling, or any reaction on the inside of your mouth or nose? Unk Did you need to seek medical attention at a hospital or doctor's office? Yes When did it last happen? 30 years ago If all above answers are "NO", may proceed with cephalosporin use.      Medication List       Accurate as of June 09, 2020  8:39 AM. If you have any questions, ask your nurse or doctor.        aspirin 81 MG EC tablet Take 1 tablet (81 mg total) by mouth daily. Swallow whole.   cyanocobalamin 1000 MCG/ML injection Commonly known as: (VITAMIN B-12) Inject 1,000 mcg into the muscle every 30 (thirty) days.   EPINEPHrine 0.3 mg/0.3 mL Soaj injection Commonly known as: EpiPen 2-Pak Inject 0.3 mLs (0.3 mg total) into the muscle as needed for anaphylaxis.   levothyroxine 75 MCG tablet Commonly known as: SYNTHROID TAKE 1 TABLET BY MOUTH EVERY DAY   loratadine 10 MG tablet Commonly known as: CLARITIN Take 1 tablet (10 mg total) by mouth daily.   nitroGLYCERIN 0.4 MG SL tablet Commonly known as: NITROSTAT Place 1 tablet (0.4 mg total) under the tongue every 5 (five) minutes as needed for chest pain.   ondansetron 4 MG tablet Commonly known as: ZOFRAN Take 1 tablet (4 mg total) by mouth every  6 (six) hours as needed for nausea.   pantoprazole 40 MG tablet Commonly known as: PROTONIX Take 1 tablet (40 mg total) by mouth daily.   pravastatin 40 MG tablet Commonly known as: PRAVACHOL TAKE 1 TABLET BY MOUTH EVERY DAY        PATIENT EDUCATION & FOLLOW-UP PLAN  An appointment for Transitional Care Management is scheduled with Jasmin Butler, Jasmin Kotyk, MD on Monday 06/12/20 at 0955.  Take all medications as prescribed  Contact our office by  calling [phone] if you have any questions or concerns

## 2020-06-12 ENCOUNTER — Ambulatory Visit (INDEPENDENT_AMBULATORY_CARE_PROVIDER_SITE_OTHER): Payer: Medicare HMO

## 2020-06-12 ENCOUNTER — Other Ambulatory Visit: Payer: Self-pay

## 2020-06-12 ENCOUNTER — Encounter: Payer: Self-pay | Admitting: Family Medicine

## 2020-06-12 ENCOUNTER — Ambulatory Visit (INDEPENDENT_AMBULATORY_CARE_PROVIDER_SITE_OTHER): Payer: Medicare HMO | Admitting: Family Medicine

## 2020-06-12 VITALS — BP 144/78 | HR 62 | Temp 98.0°F | Ht 63.0 in | Wt 141.0 lb

## 2020-06-12 DIAGNOSIS — R079 Chest pain, unspecified: Secondary | ICD-10-CM | POA: Diagnosis not present

## 2020-06-12 DIAGNOSIS — I209 Angina pectoris, unspecified: Secondary | ICD-10-CM

## 2020-06-12 DIAGNOSIS — R0602 Shortness of breath: Secondary | ICD-10-CM | POA: Diagnosis not present

## 2020-06-12 DIAGNOSIS — E538 Deficiency of other specified B group vitamins: Secondary | ICD-10-CM | POA: Diagnosis not present

## 2020-06-12 MED ORDER — CYANOCOBALAMIN 1000 MCG/ML IJ SOLN
1000.0000 ug | INTRAMUSCULAR | Status: AC
  Administered 2020-06-12 – 2024-11-17 (×53): 1000 ug via INTRAMUSCULAR

## 2020-06-12 NOTE — Progress Notes (Signed)
Cyanocobalamin injection given to left deltoid.  Patient tolerated well. 

## 2020-06-12 NOTE — Progress Notes (Signed)
BP (!) 144/78   Pulse 62   Temp 98 F (36.7 C)   Ht 5\' 3"  (1.6 m)   Wt 141 lb (64 kg)   SpO2 99%   BMI 24.98 kg/m    Subjective:   Patient ID: Jasmin Butler, female    DOB: Jul 07, 1951, 69 y.o.   MRN: [id number]  HPI: Jasmin Butler is a 69 y.o. female presenting on 06/12/2020 for Chest Pain (comes and goes), Fatigue, and Shortness of Breath   HPI TRANSITIONAL CARE MANAGEMENT  Contact Date: 06/09/2020 Contacted By: Truett Mainland, LPN   DISCHARGE INFORMATION Date of Discharge:06/08/2020             Discharge Facility: [facility] Discharge Diagnosis:Chest Pain, Unspecified   Outpatient Follow Up Recommendations (copied from discharge summary) 1. Follow up with PCP in 1 week 2. Outpatient follow-up with cardiology 3. Follow up in ED if symptoms worsen or new appear  Patient had chest tightness and through shoulder blades and BP was high. BP 179/93. As the BP came down the pressure got better.  She was working with her sons chickens and out in heat and had pressure in her chest and went to ED the next day. She had a good few days but today the pressure is starting again. She is feeling more out of breath. BP was 144/78 today. She has appt to see cardiology in 10/28. Ct chest was normal. Patient has exertional angina and started again today.  She started feeling this again today when she was helping with the chickens again.  She denies any shortness of breath or wheezing or fevers or chills or cough or any other respiratory symptoms.  She states he was short of breath on exertion and she started feeling the chest pressure on exertion again today.  Relevant past medical, surgical, family and social history reviewed and updated as indicated. Interim medical history since our last visit reviewed. Allergies and medications reviewed and updated.  Review of Systems  Constitutional: Negative for chills and fever.  Eyes: Negative for visual disturbance.  Respiratory:  Positive for shortness of breath. Negative for chest tightness.   Cardiovascular: Positive for chest pain. Negative for palpitations and leg swelling.  Genitourinary: Negative for difficulty urinating and dysuria.  Musculoskeletal: Negative for back pain and gait problem.  Skin: Negative for rash.  Neurological: Negative for light-headedness and headaches.  Psychiatric/Behavioral: Negative for agitation and behavioral problems.  All other systems reviewed and are negative.   Per HPI unless specifically indicated above   Allergies as of 06/12/2020      Reactions   Penicillins Rash   Did it involve swelling of the face/tongue/throat, SOB, or low BP? Yes Did it involve sudden or severe rash/hives, skin peeling, or any reaction on the inside of your mouth or nose? Unk Did you need to seek medical attention at a hospital or doctor's office? Yes When did it last happen? 30 years ago If all above answers are "NO", may proceed with cephalosporin use.      Medication List       Accurate as of June 12, 2020 10:39 AM. If you have any questions, ask your nurse or doctor.        STOP taking these medications   pantoprazole 40 MG tablet Commonly known as: PROTONIX Stopped by: Fransisca Kaufmann Deyna Carbon, MD     TAKE these medications   aspirin 81 MG EC tablet Take 1 tablet (81 mg total) by mouth daily. Swallow  whole.   cyanocobalamin 1000 MCG/ML injection Commonly known as: (VITAMIN B-12) Inject 1,000 mcg into the muscle every 30 (thirty) days.   EPINEPHrine 0.3 mg/0.3 mL Soaj injection Commonly known as: EpiPen 2-Pak Inject 0.3 mLs (0.3 mg total) into the muscle as needed for anaphylaxis.   levothyroxine 75 MCG tablet Commonly known as: SYNTHROID TAKE 1 TABLET BY MOUTH EVERY DAY   loratadine 10 MG tablet Commonly known as: CLARITIN Take 1 tablet (10 mg total) by mouth daily.   nitroGLYCERIN 0.4 MG SL tablet Commonly known as: NITROSTAT Place 1 tablet (0.4 mg total) under the  tongue every 5 (five) minutes as needed for chest pain.   ondansetron 4 MG tablet Commonly known as: ZOFRAN Take 1 tablet (4 mg total) by mouth every 6 (six) hours as needed for nausea.   pravastatin 40 MG tablet Commonly known as: PRAVACHOL TAKE 1 TABLET BY MOUTH EVERY DAY        Objective:   BP (!) 144/78   Pulse 62   Temp 98 F (36.7 C)   Ht 5\' 3"  (1.6 m)   Wt 141 lb (64 kg)   SpO2 99%   BMI 24.98 kg/m   Wt Readings from Last 3 Encounters:  06/12/20 141 lb (64 kg)  06/07/20 142 lb 3.2 oz (64.5 kg)  04/24/20 144 lb (65.3 kg)    Physical Exam Vitals and nursing note reviewed.  Constitutional:      General: She is not in acute distress.    Appearance: She is well-developed. She is not diaphoretic.  Eyes:     Conjunctiva/sclera: Conjunctivae normal.     Pupils: Pupils are equal, round, and reactive to light.  Cardiovascular:     Rate and Rhythm: Normal rate. Rhythm irregular.     Heart sounds: Normal heart sounds. No murmur heard.   Pulmonary:     Effort: Pulmonary effort is normal. No respiratory distress.     Breath sounds: Normal breath sounds. No wheezing or rhonchi.  Musculoskeletal:        General: No tenderness. Normal range of motion.  Skin:    General: Skin is warm and dry.     Findings: No rash.  Neurological:     Mental Status: She is alert and oriented to person, place, and time.     Coordination: Coordination normal.  Psychiatric:        Behavior: Behavior normal.     Assessment & Plan:   Problem List Items Addressed This Visit      Other   Chest pain   Dyspnea    Other Visit Diagnoses    Angina pectoris ([place])    -  Primary    Angina like chest pain, started again today, will call cardiology to discuss case and see about sooner appt.  Already had extensive workup in ED.   They have an appointment to see their office on Wednesday with 1 of Dr. Rosezella [place] assistance and she will go for an 845 the morning appointment this Wednesday to  follow-up on this.  Patient already had CT chest and echocardiogram and NM perfusion scan, will send to discuss this with cardiology, it still seems like it something cardiac or angina related based on symptoms.   Follow up plan: Return in about 2 weeks (around 06/26/2020), or if symptoms worsen or fail to improve, for f/u after cardiology with pcp as soon as visit available.  Counseling provided for all of the vaccine components No orders of the defined types  were placed in this encounter.   Caryl Pina, MD [facility] 06/12/2020, 10:39 AM

## 2020-06-12 NOTE — Addendum Note (Signed)
Addended by: Caryl Pina on: 06/12/2020 10:46 AM   Modules accepted: Orders

## 2020-06-13 LAB — CBC WITH DIFFERENTIAL/PLATELET
Basophils Absolute: 0.1 10*3/uL (ref 0.0–0.2)
Basos: 1 %
EOS (ABSOLUTE): 0.2 10*3/uL (ref 0.0–0.4)
Eos: 2 %
Hematocrit: 39.4 % (ref 34.0–46.6)
Hemoglobin: 12.8 g/dL (ref 11.1–15.9)
Immature Grans (Abs): 0 10*3/uL (ref 0.0–0.1)
Immature Granulocytes: 0 %
Lymphocytes Absolute: 2.3 10*3/uL (ref 0.7–3.1)
Lymphs: 33 %
MCH: 28 pg (ref 26.6–33.0)
MCHC: 32.5 g/dL (ref 31.5–35.7)
MCV: 86 fL (ref 79–97)
Monocytes Absolute: 0.5 10*3/uL (ref 0.1–0.9)
Monocytes: 7 %
Neutrophils Absolute: 3.8 10*3/uL (ref 1.4–7.0)
Neutrophils: 57 %
Platelets: 252 10*3/uL (ref 150–450)
RBC: 4.57 x10E6/uL (ref 3.77–5.28)
RDW: 13.2 % (ref 11.7–15.4)
WBC: 6.9 10*3/uL (ref 3.4–10.8)

## 2020-06-13 LAB — CMP14+EGFR
ALT: 13 IU/L (ref 0–32)
AST: 25 IU/L (ref 0–40)
Albumin/Globulin Ratio: 2 (ref 1.2–2.2)
Albumin: 4.8 g/dL (ref 3.8–4.8)
Alkaline Phosphatase: 77 IU/L (ref 48–121)
BUN/Creatinine Ratio: 14 (ref 12–28)
BUN: 8 mg/dL (ref 8–27)
Bilirubin Total: <0.2 mg/dL (ref 0.0–1.2)
CO2: 24 mmol/L (ref 20–29)
Calcium: 9.9 mg/dL (ref 8.7–10.3)
Chloride: 99 mmol/L (ref 96–106)
Creatinine, Ser: 0.59 mg/dL (ref 0.57–1.00)
GFR calc Af Amer: 107 mL/min/{1.73_m2} (ref >59)
GFR calc non Af Amer: 93 mL/min/{1.73_m2} (ref >59)
Globulin, Total: 2.4 g/dL (ref 1.5–4.5)
Glucose: 88 mg/dL (ref 65–99)
Potassium: 4.7 mmol/L (ref 3.5–5.2)
Sodium: 140 mmol/L (ref 134–144)
Total Protein: 7.2 g/dL (ref 6.0–8.5)

## 2020-06-13 NOTE — Progress Notes (Signed)
Cardiology Office Note   Date:  06/14/2020   ID:  Bayan, Hedstrom [birth year], MRN [id number]  PCP:  [name], DO  Cardiologist:  Minus Breeding, MD; last seen in 2016 EP: None  Chief Complaint  Patient presents with  . Hospitalization Follow-up    chest pain and SOB      History of Present Illness: Kewanna Kasprzak is a 69 y.o. female with PMH of HLD, hypothyroidism, and recent exertional chest pain who presents for follow-up of chest pain and SOB.  She was recently admitted to [facility] with complaints of chest pain. She was evaluated by Dr. Harl Bowie and underwent an extensive cardiac work-up, including negative HsTrop's, non-ischemic EKG, CTA chest without acute abnormalities or aortic dissection. Echo with EF 60-65%, G1DD, no RWMA, and no significant valvular abnormalities, and NST was without ischemia. Prior to this she underwent a coronary CTA in 2015 which showed a calcium score of 0 with normal coronary arteries.  She presents today for follow-up of her chest pain. She still has intermittent exertional chest pressure with walking. She has taking SL nitro a couple times with relief of symptoms. She also has associated DOE. SBP has been consistently in the 140s on home monitoring which she reports is new over the past month. No complaints of LE edema, orthopnea, PND, dizziness, lightheadedness, palpitations, or syncope. No correlation with food intake. 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 Her LDL has been above goal on last several checks. She reports taking crestor and lipitor in the past which were stopped due to leg pains. Subsequently started on CoQ10 and has tolerated pravastatin. We discussed options to get LDL to goal and she would like to trial crestor again now that she is on CoQ10.      Past Medical History:  Diagnosis Date  . Allergy to alpha-gal   . Anxiety   . Cataract   .  Depression    "couple times/yr" (09/29/2014)  . GERD (gastroesophageal reflux disease) 2005  . Headache    "weekly" (09/29/2014)  . History of hiatal hernia   . Hyperlipidemia   . IBS (irritable bowel syndrome)   . Migraine    "2-3 times/yr" (09/29/2014)  . Pernicious anemia   . Pneumonia 1980's   "double"  . Thyroid disease     Past Surgical History:  Procedure Laterality Date  . ABDOMINAL HYSTERECTOMY  1985  . CARDIAC CATHETERIZATION  ~ 2005  . CATARACT EXTRACTION W/ INTRAOCULAR LENS  IMPLANT, BILATERAL Bilateral 2000's  . CHOLECYSTECTOMY OPEN  ~ 1990  . EYE SURGERY    . TUBAL LIGATION  ~ 1983     Current Outpatient Medications  Medication Sig Dispense Refill  . aspirin EC 81 MG EC tablet Take 1 tablet (81 mg total) by mouth daily. Swallow whole. 30 tablet 0  . Coenzyme Q10 (COQ10 PO) Take by mouth.    . cyanocobalamin (,VITAMIN B-12,) 1000 MCG/ML injection Inject 1,000 mcg into the muscle every 30 (thirty) days.     Marland Kitchen EPINEPHrine (EPIPEN 2-PAK) 0.3 mg/0.3 mL IJ SOAJ injection Inject 0.3 mLs (0.3 mg total) into the muscle as needed for anaphylaxis. 1 Device 2  . levothyroxine (SYNTHROID) 75 MCG tablet TAKE 1 TABLET BY MOUTH EVERY DAY 90 tablet 3  . loratadine (CLARITIN) 10 MG tablet Take 1 tablet (10 mg total) by mouth daily. 30 tablet 11  . nitroGLYCERIN (NITROSTAT) 0.4 MG SL tablet  Place 1 tablet (0.4 mg total) under the tongue every 5 (five) minutes as needed for chest pain. 30 tablet 0  . ondansetron (ZOFRAN) 4 MG tablet Take 1 tablet (4 mg total) by mouth every 6 (six) hours as needed for nausea. 20 tablet 0  . amLODipine (NORVASC) 5 MG tablet Take 1 tablet (5 mg total) by mouth daily. 180 tablet 3  . rosuvastatin (CRESTOR) 40 MG tablet Take 1 tablet (40 mg total) by mouth daily. 90 tablet 3   Current Facility-Administered Medications  Medication Dose Route Frequency Provider Last Rate Last Admin  . cyanocobalamin ((VITAMIN B-12)) injection 1,000 mcg  1,000 mcg  Intramuscular Q30 days Ronnie Doss M, DO   1,000 mcg at 06/12/20 1001    Allergies:   Penicillins    Social History:  The patient  reports that she has never smoked. She has never used smokeless tobacco. She reports that she does not drink alcohol and does not use drugs.   Family History:  The patient's family history includes Arthritis in her mother; Asthma in her brother; Birth defects in her son; Cancer in her brother and father; Hypertension in her mother; Leukemia in her father; Stroke (age of onset: 67) in her mother; Thyroid disease in her mother.    ROS:  Please see the history of present illness.   Otherwise, review of systems are positive for none.   All other systems are reviewed and negative.    PHYSICAL EXAM: VS:  BP 140/70   Pulse 63   Ht 5\' 3"  (1.6 m)   Wt 140 lb 12.8 oz (63.9 kg)   BMI 24.94 kg/m  , BMI Body mass index is 24.94 kg/m. GEN: Well nourished, well developed, in no acute distress HEENT: normal Neck: no JVD, carotid bruits, or masses Cardiac: RRR; no murmurs, rubs, or gallops, no edema  Respiratory:  clear to auscultation bilaterally, normal work of breathing GI: soft, nontender, nondistended, + BS MS: no deformity or atrophy Skin: warm and dry, no rash Neuro:  Strength and sensation are intact Psych: euthymic mood, full affect   EKG:  EKG is ordered today. The ekg ordered today demonstrates sinus rhythm with rate 63 bpm, LAD, no STE/D, no significant change from previous   Recent Labs: 03/20/2020: TSH 1.720 06/12/2020: ALT 13; BUN 8; Creatinine, Ser 0.59; Hemoglobin 12.8; Platelets 252; Potassium 4.7; Sodium 140    Lipid Panel    Component Value Date/Time   CHOL 196 06/08/2020 0751   CHOL 195 08/10/2019 0906   TRIG 164 (H) 06/08/2020 0751   HDL 50 06/08/2020 0751   HDL 50 08/10/2019 0906   CHOLHDL 3.9 06/08/2020 0751   VLDL 33 06/08/2020 0751   LDLCALC 113 (H) 06/08/2020 0751   LDLCALC 115 (H) 08/10/2019 0906   LDLDIRECT 101 (H)  05/29/2018 0835      Wt Readings from Last 3 Encounters:  06/14/20 140 lb 12.8 oz (63.9 kg)  06/12/20 141 lb (64 kg)  06/07/20 142 lb 3.2 oz (64.5 kg)      Other studies Reviewed: Additional studies/ records that were reviewed today include:   Echocardiogram 06/08/20: 1. Left ventricular ejection fraction, by estimation, is 60 to 65%. The  left ventricle has normal function. The left ventricle has no regional  wall motion abnormalities. There is mild left ventricular hypertrophy.  Left ventricular diastolic parameters  are consistent with Grade I diastolic dysfunction (impaired relaxation).  2. Right ventricular systolic function is normal. The right ventricular  size is  normal.  3. The mitral valve is normal in structure. No evidence of mitral valve  regurgitation. No evidence of mitral stenosis.  4. The aortic valve is tricuspid. Aortic valve regurgitation is not  visualized. No aortic stenosis is present.  5. The inferior vena cava is normal in size with greater than 50%  respiratory variability, suggesting right atrial pressure of 3 mmHg.   NST 06/08/20:  There was no ST segment deviation noted during stress.  The study is normal. There are no perfusion defects  This is a low risk study.  The left ventricular ejection fraction is normal (55-65%).  Coronary CTA 2015: IMPRESSION:  1) Calcium Score 0  2) Normal Left dominant coronary arteries  3) Mild ascending aortic root dilatation  4) Linear basilar opacities /scarring see report by Radiology  ASSESSMENT AND PLAN:  1. Chest pain: she continues to have intermittent exertional chest pain, occasionally associated with DOE. Question whether these episodes correlate with elevated blood pressures given the quite reassuring cardiac work up done during her recent admission to [facility] 8/25-26. May be a coronary artery spasm component given relief with SL nitro.  - Will start amlodipine 5mg  daily for vasospasms - Can  continue prn SL nitro - if she continues to use regularly, could consider trial of imdur  2. HLD: LDL 113 06/08/20 on pravastatin 40mg  daily and has been for years. Previously had leg pains with crestor/lipitor though wasn't on CoQ10 at that time. She wants to trial back on a more potent statin in addition to CoQ10  - Will stop pravastatin and start rosuvastatin 40mg  daily - Will repeat FLP/LFTs in 6-8 weeks if tolerated  3. HTN: no formal diagnosis of HTN. BP somewhat labile during recent admission with SBP as low as 110s and up to 160s. Seen at PCP's office yesterday with BP 144/78 and today is 140/70 - Favor starting amlodipine 5mg  daily  4. Hypothyroidism: managed by PCP. TSH wnl 03/2020 - Continue levothyroxine per PCP   Current medicines are reviewed at length with the patient today.  The patient does not have concerns regarding medicines.  The following changes have been made:  As above  Labs/ tests ordered today include:   Orders Placed This Encounter  Procedures  . Lipid panel  . Hepatic function panel  . EKG 12-Lead     Disposition:   FU with me in 2 weeks  Signed, Abigail Butts, PA-C  06/14/2020 9:47 AM

## 2020-06-14 ENCOUNTER — Ambulatory Visit: Payer: Medicare HMO | Admitting: Medical

## 2020-06-14 ENCOUNTER — Encounter: Payer: Self-pay | Admitting: Medical

## 2020-06-14 ENCOUNTER — Other Ambulatory Visit: Payer: Self-pay

## 2020-06-14 VITALS — BP 140/70 | HR 63 | Ht 63.0 in | Wt 140.8 lb

## 2020-06-14 DIAGNOSIS — R079 Chest pain, unspecified: Secondary | ICD-10-CM

## 2020-06-14 DIAGNOSIS — I10 Essential (primary) hypertension: Secondary | ICD-10-CM | POA: Diagnosis not present

## 2020-06-14 DIAGNOSIS — E785 Hyperlipidemia, unspecified: Secondary | ICD-10-CM

## 2020-06-14 DIAGNOSIS — E039 Hypothyroidism, unspecified: Secondary | ICD-10-CM | POA: Diagnosis not present

## 2020-06-14 MED ORDER — AMLODIPINE BESYLATE 5 MG PO TABS: 5.0000 mg | ORAL_TABLET | Freq: Every day | ORAL | 3 refills | Status: DC

## 2020-06-14 MED ORDER — ROSUVASTATIN CALCIUM 40 MG PO TABS: 40.0000 mg | ORAL_TABLET | Freq: Every day | ORAL | 3 refills | Status: DC

## 2020-06-14 NOTE — Patient Instructions (Addendum)
Medication Instructions:   START Amlodipine 5 mg daily  START Rosuvastatin (Crestor) 40 mg daily  STOP PRAVASTATIN  *If you need a refill on your cardiac medications before your next appointment, please call your pharmacy*  Lab Work: Your physician recommends that you return for lab work in 6 WEEKS-07/26/2020:   FASTING LIPID PANEL-DO NOT EAT OR DRINK PAST MIDNIGHT. OKAY TO DRINK WATER  HEPATIC (LIVER) FUNCTION TEST  If you have labs (blood work) drawn today and your tests are completely normal, you will receive your results only by: Marland Kitchen MyChart Message (if you have MyChart) OR . A paper copy in the mail If you have any lab test that is abnormal or we need to change your treatment, we will call you to review the results.  Testing/Procedures: NONE ordered at this time of appointment   Follow-Up: At [facility], you and your health needs are our priority.  As part of our continuing mission to provide you with exceptional heart care, we have created designated Provider Care Teams.  These Care Teams include your primary Cardiologist (physician) and Advanced Practice Providers (APPs -  Physician Assistants and Nurse Practitioners) who all work together to provide you with the care you need, when you need it.  Your next appointment:   2 week(s)  The format for your next appointment:   In Person  Provider:   Roby Lofts, PA-C  Other Instructions

## 2020-06-28 ENCOUNTER — Telehealth: Payer: Self-pay | Admitting: Medical

## 2020-06-28 ENCOUNTER — Ambulatory Visit: Payer: Medicare HMO

## 2020-06-28 NOTE — Telephone Encounter (Signed)
Pt called to report that she has been having some dizziness and episodes at night that wake her up and her heart would be racing and she would get very anxious... she says it started when she added the Amlodipine 5 mg a day.   She says that her BP has been good 102/65, 106/64, 119/64 during the day.... but she says her struggles have been in the middle of the night... 4 am 155/95 HR 98,  170/100 and HR 117.   She is asking to hold her Amlodipine today and tomorrow until her appt with Roby Lofts PA 06/30/20.   Pt says she has not had further chest pressure since leaving the office 06/14/20 and prior to starting the Amlodipine so she does not think that has been the reason for her imporvement.

## 2020-06-28 NOTE — Telephone Encounter (Signed)
Pt c/o medication issue:  1. Name of Medication: amLODipine (NORVASC) 5 MG tablet   2. How are you currently taking this medication (dosage and times per day)? As written  3. Are you having a reaction (difficulty breathing--STAT)? No   4. What is your medication issue? Having dizzy spells and elevated HR; keeps patient up at night

## 2020-06-29 NOTE — Telephone Encounter (Signed)
Spoke with the pt and she has been keeping a log of her BP readings and will bring with her to her appt 06/30/20.   Pt says she held her Amlodipine yesterday but woke up again at 4 am with her heart racing and feeling anxious. She is asking if the Crestor could be causing her to have this problem... she says she read online that it could possibly cause this... she will hold it today and see how well she sleeps this evening and will report to [place] tomorrow at her appt. She will keep track of her BP today and call if it is elevated from holding her Amlodipine.

## 2020-06-29 NOTE — Progress Notes (Signed)
Cardiology Office Note   Date:  06/30/2020   ID:  Jasmin Butler, Jasmin Butler Jul 15, 1951, MRN [id number]  PCP:  [name], DO  Cardiologist:  Minus Breeding, MD EP: None  Chief Complaint  Patient presents with  . Palpitations      History of Present Illness: Jasmin Butler is a 69 y.o. female with a PMH of HLD, hypothyroidism, and recent exertional chest pain who presents for follow-up of chest pain and HTN.  She was last evaluated by cardiology at an outpatient visit with myself in follow-up of a recent hospitalization to [facility] for the evaluation of chest pain. She was seen by Dr. Harl Bowie during that admission and underwent an extensive cardiac work-up, including negative HsTrop's, non-ischemic EKG, CTA chest without acute abnormalities or aortic dissection. Echo with EF 60-65%, G1DD, no RWMA, and no significant valvular abnormalities, and NST was without ischemia. Prior to this she underwent a coronary CTA in 2015 which showed a calcium score of 0 with normal coronary arteries.   At her visit 06/14/20, she continued to have intermittent exertional chest pressure with walking. She'd taken SL nitro a couple times with relief of symptoms. She also had associated DOE. SBP were consistently in the 140s on home monitoring which she reported was new over the past month. No complaints of LE edema, orthopnea, PND, dizziness, lightheadedness, palpitations, or syncope. No correlation with food intake. 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 Her LDL has been above goal on last several checks. She reported taking crestor and lipitor in the past which were stopped due to leg pains. Subsequently started on CoQ10 and has tolerated pravastatin. We discussed options to get LDL to goal and she would like to trial crestor again now that she is on CoQ10.  Additionally she was started on amlodipine for possible vasospasms and improved  BP control.   She presents today for follow-up of her chest pain and HTN. She contacted our office 06/28/20 to report dizzy spells and elevated HR's at night. She attributed these symptoms to her amlodipine which was started 06/14/20. She held her amlodipine on the night of 06/28/20, however continued to wake up in the early morning hours with a racing heart beat and anxious feeling. This is a new issue for her. She denies associated chest pain, SOB, dizziness, lightheadedness, or syncope. Interestingly, her BP is often concurrently elevated, but not always. BP has been generally at goal, aside from the early morning hours. Her chest pressure has been better overall. She has taken SL nitro 4 times in the past 2 weeks with relief of symptoms. She is quite worried about these palpitation spells.     Past Medical History:  Diagnosis Date  . Allergy to alpha-gal   . Anxiety   . Cataract   . Depression    "couple times/yr" (09/29/2014)  . GERD (gastroesophageal reflux disease) 2005  . Headache    "weekly" (09/29/2014)  . History of hiatal hernia   . Hyperlipidemia   . IBS (irritable bowel syndrome)   . Migraine    "2-3 times/yr" (09/29/2014)  . Pernicious anemia   . Pneumonia 1980's   "double"  . Thyroid disease     Past Surgical History:  Procedure Laterality Date  . ABDOMINAL HYSTERECTOMY  1985  . CARDIAC CATHETERIZATION  ~ 2005  . CATARACT EXTRACTION W/ INTRAOCULAR LENS  IMPLANT, BILATERAL Bilateral 2000's  . CHOLECYSTECTOMY OPEN  ~ 1990  .  EYE SURGERY    . TUBAL LIGATION  ~ 1983     Current Outpatient Medications  Medication Sig Dispense Refill  . aspirin EC 81 MG EC tablet Take 1 tablet (81 mg total) by mouth daily. Swallow whole. 30 tablet 0  . Coenzyme Q10 (COQ10 PO) Take by mouth.    . cyanocobalamin (,VITAMIN B-12,) 1000 MCG/ML injection Inject 1,000 mcg into the muscle every 30 (thirty) days.     Marland Kitchen EPINEPHrine (EPIPEN 2-PAK) 0.3 mg/0.3 mL IJ SOAJ injection Inject 0.3 mLs  (0.3 mg total) into the muscle as needed for anaphylaxis. 1 Device 2  . levothyroxine (SYNTHROID) 75 MCG tablet TAKE 1 TABLET BY MOUTH EVERY DAY 90 tablet 3  . loratadine (CLARITIN) 10 MG tablet Take 1 tablet (10 mg total) by mouth daily. 30 tablet 11  . nitroGLYCERIN (NITROSTAT) 0.4 MG SL tablet Place 1 tablet (0.4 mg total) under the tongue every 5 (five) minutes as needed for chest pain. 30 tablet 0  . ondansetron (ZOFRAN) 4 MG tablet Take 1 tablet (4 mg total) by mouth every 6 (six) hours as needed for nausea. 20 tablet 0  . rosuvastatin (CRESTOR) 40 MG tablet Take 1 tablet (40 mg total) by mouth daily. 90 tablet 3  . carvedilol (COREG) 3.125 MG tablet Take 1 tablet (3.125 mg total) by mouth 2 (two) times daily. 180 tablet 3   Current Facility-Administered Medications  Medication Dose Route Frequency Provider Last Rate Last Admin  . cyanocobalamin ((VITAMIN B-12)) injection 1,000 mcg  1,000 mcg Intramuscular Q30 days Ronnie Doss M, DO   1,000 mcg at 06/12/20 1001    Allergies:   Penicillins    Social History:  The patient  reports that she has never smoked. She has never used smokeless tobacco. She reports that she does not drink alcohol and does not use drugs.   Family History:  The patient's family history includes Arthritis in her mother; Asthma in her brother; Birth defects in her son; Cancer in her brother and father; Hypertension in her mother; Leukemia in her father; Stroke (age of onset: 70) in her mother; Thyroid disease in her mother.    ROS:  Please see the history of present illness.   Otherwise, review of systems are positive for none.   All other systems are reviewed and negative.    PHYSICAL EXAM: VS:  BP 129/80   Pulse 68   Ht 5\' 3"  (1.6 m)   Wt 140 lb 3.2 oz (63.6 kg)   SpO2 97%   BMI 24.84 kg/m  , BMI Body mass index is 24.84 kg/m. GEN: Well nourished, well developed, in no acute distress HEENT: sclera anicteric Neck: no JVD, carotid bruits, or  masses Cardiac: RRR; no murmurs, rubs, or gallops,no edema  Respiratory:  clear to auscultation bilaterally, normal work of breathing GI: soft, nontender, nondistended, + BS MS: no deformity or atrophy Skin: warm and dry, no rash Neuro:  Strength and sensation are intact Psych: euthymic mood, full affect   EKG:  EKG is not ordered today.   Recent Labs: 03/20/2020: TSH 1.720 06/12/2020: ALT 13; BUN 8; Creatinine, Ser 0.59; Hemoglobin 12.8; Platelets 252; Potassium 4.7; Sodium 140    Lipid Panel    Component Value Date/Time   CHOL 196 06/08/2020 0751   CHOL 195 08/10/2019 0906   TRIG 164 (H) 06/08/2020 0751   HDL 50 06/08/2020 0751   HDL 50 08/10/2019 0906   CHOLHDL 3.9 06/08/2020 0751   VLDL 33 06/08/2020 0751  LDLCALC 113 (H) 06/08/2020 0751   LDLCALC 115 (H) 08/10/2019 0906   LDLDIRECT 101 (H) 05/29/2018 0835      Wt Readings from Last 3 Encounters:  06/30/20 140 lb 3.2 oz (63.6 kg)  06/14/20 140 lb 12.8 oz (63.9 kg)  06/12/20 141 lb (64 kg)      Other studies Reviewed: Additional studies/ records that were reviewed today include:   Echocardiogram 06/08/20: 1. Left ventricular ejection fraction, by estimation, is 60 to 65%. The  left ventricle has normal function. The left ventricle has no regional  wall motion abnormalities. There is mild left ventricular hypertrophy.  Left ventricular diastolic parameters  are consistent with Grade I diastolic dysfunction (impaired relaxation).  2. Right ventricular systolic function is normal. The right ventricular  size is normal.  3. The mitral valve is normal in structure. No evidence of mitral valve  regurgitation. No evidence of mitral stenosis.  4. The aortic valve is tricuspid. Aortic valve regurgitation is not  visualized. No aortic stenosis is present.  5. The inferior vena cava is normal in size with greater than 50%  respiratory variability, suggesting right atrial pressure of 3 mmHg.   NST 06/08/20:  There  was no ST segment deviation noted during stress.  The study is normal. There are no perfusion defects  This is a low risk study.  The left ventricular ejection fraction is normal (55-65%).  Coronary CTA 2015: IMPRESSION:  1) Calcium Score 0  2) Normal Left dominant coronary arteries  3) Mild ascending aortic root dilatation  4) Linear basilar opacities /scarring see report by Radiology     ASSESSMENT AND PLAN:  1. Palpitations: HR is regular on exam today. She does not feel her heart is beating erratically, just fast when she is awoken from sleep. No associated CP, SOB, dizziness, lightheadedness, or syncope.  -  Will plan for a 1 week zio patch monitor to evaluate for arrhythmias.  - Will start carvedilol 3.125mg  BID for palpitations/HTN management  2. Chest pain: Felt to be 2/2 seems improved. Still using some SL nitro sparingly with relief of chest pressure.   - Will stop amlodipine and transition to carvedilol 3.125mg  BID - can uptitrate as tolerated. - Can continue prn SL nitro - if she continues to use regularly, could consider trial of imdur  3. HLD: LDL 113 06/08/20 on pravastatin 40mg  daily and has been for years. Tolerating rosuvastatin + CoQ10 - Continue rosuvastatin 40mg  daily - Will repeat FLP/LFTs in 1 month.  4. HTN: BP was well controlled on amlodipine, though some concern this contributed to her recent palpitations.  - Will stop amlodipine and start carvedilol 3.125mg  BID - can uptitrate as needed  5. Hypothyroidism: managed by PCP. TSH wnl 03/2020 - Will check TSH/FT4 today given new palpitations - Continue levothyroxine per PCP    Current medicines are reviewed at length with the patient today.  The patient does not have concerns regarding medicines.  The following changes have been made:  As above  Labs/ tests ordered today include:   Orders Placed This Encounter  Procedures  . TSH  . T4, free  . LONG TERM MONITOR (3-14 DAYS)      Disposition:   FU with Dr. Percival Spanish as scheduled 08/09/20   Signed, Abigail Butts, PA-C  06/30/2020 12:21 PM

## 2020-06-30 ENCOUNTER — Encounter: Payer: Self-pay | Admitting: Medical

## 2020-06-30 ENCOUNTER — Other Ambulatory Visit: Payer: Self-pay

## 2020-06-30 ENCOUNTER — Ambulatory Visit: Payer: Medicare HMO | Admitting: Medical

## 2020-06-30 VITALS — BP 129/80 | HR 68 | Ht 63.0 in | Wt 140.2 lb

## 2020-06-30 DIAGNOSIS — R079 Chest pain, unspecified: Secondary | ICD-10-CM | POA: Diagnosis not present

## 2020-06-30 DIAGNOSIS — I10 Essential (primary) hypertension: Secondary | ICD-10-CM

## 2020-06-30 DIAGNOSIS — E785 Hyperlipidemia, unspecified: Secondary | ICD-10-CM | POA: Diagnosis not present

## 2020-06-30 DIAGNOSIS — E039 Hypothyroidism, unspecified: Secondary | ICD-10-CM | POA: Diagnosis not present

## 2020-06-30 DIAGNOSIS — R002 Palpitations: Secondary | ICD-10-CM | POA: Diagnosis not present

## 2020-06-30 MED ORDER — CARVEDILOL 3.125 MG PO TABS: 3.1250 mg | ORAL_TABLET | Freq: Two times a day (BID) | ORAL | 3 refills | Status: DC

## 2020-06-30 NOTE — Patient Instructions (Signed)
Medication Instructions:  Stop Amlodipine. Start Carvedilol 3.125mg  (1 Tablet Twice Daily) *If you need a refill on your cardiac medications before your next appointment, please call your pharmacy*   Lab Work: TSH, T4 Free If you have labs (blood work) drawn today and your tests are completely normal, you will receive your results only by: Marland Kitchen MyChart Message (if you have MyChart) OR . A paper copy in the mail If you have any lab test that is abnormal or we need to change your treatment, we will call you to review the results.   Testing/Procedures: Your physician has recommended that you wear an event monitor. Event monitors are medical devices that record the heart's electrical activity. Doctors most often [place] these monitors to diagnose arrhythmias. Arrhythmias are problems with the speed or rhythm of the heartbeat. The monitor is a small, portable device. You can wear one while you do your normal daily activities. This is usually used to diagnose what is causing palpitations/syncope (passing out).    Follow-Up: At [facility], you and your health needs are our priority.  As part of our continuing mission to provide you with exceptional heart care, we have created designated Provider Care Teams.  These Care Teams include your primary Cardiologist (physician) and Advanced Practice Providers (APPs -  Physician Assistants and Nurse Practitioners) who all work together to provide you with the care you need, when you need it.  Your next appointment:   August 09, 2020 In [place]  The format for your next appointment:   In Person  Provider:   Minus Breeding, MD   Other Instructions Monitor Blood Pressure and Heart Rate

## 2020-07-01 LAB — TSH: TSH: 1.32 u[IU]/mL (ref 0.450–4.500)

## 2020-07-01 LAB — T4, FREE: Free T4: 1.25 ng/dL (ref 0.82–1.77)

## 2020-07-05 ENCOUNTER — Ambulatory Visit (INDEPENDENT_AMBULATORY_CARE_PROVIDER_SITE_OTHER): Payer: Medicare HMO

## 2020-07-05 DIAGNOSIS — E785 Hyperlipidemia, unspecified: Secondary | ICD-10-CM | POA: Diagnosis not present

## 2020-07-05 DIAGNOSIS — E039 Hypothyroidism, unspecified: Secondary | ICD-10-CM | POA: Diagnosis not present

## 2020-07-05 DIAGNOSIS — I10 Essential (primary) hypertension: Secondary | ICD-10-CM

## 2020-07-05 DIAGNOSIS — R002 Palpitations: Secondary | ICD-10-CM

## 2020-07-05 DIAGNOSIS — R079 Chest pain, unspecified: Secondary | ICD-10-CM | POA: Diagnosis not present

## 2020-07-13 DIAGNOSIS — R079 Chest pain, unspecified: Secondary | ICD-10-CM | POA: Diagnosis not present

## 2020-07-13 DIAGNOSIS — R002 Palpitations: Secondary | ICD-10-CM | POA: Diagnosis not present

## 2020-07-14 ENCOUNTER — Ambulatory Visit (INDEPENDENT_AMBULATORY_CARE_PROVIDER_SITE_OTHER): Payer: Medicare HMO

## 2020-07-14 ENCOUNTER — Other Ambulatory Visit: Payer: Self-pay

## 2020-07-14 DIAGNOSIS — E538 Deficiency of other specified B group vitamins: Secondary | ICD-10-CM | POA: Diagnosis not present

## 2020-07-14 DIAGNOSIS — Z23 Encounter for immunization: Secondary | ICD-10-CM | POA: Diagnosis not present

## 2020-07-14 NOTE — Progress Notes (Signed)
Cyanocobalamin injection given to right deltoid.  Patient tolerated well. 

## 2020-07-21 NOTE — Telephone Encounter (Signed)
Melinda LPN sent results to MyChart today

## 2020-07-25 ENCOUNTER — Other Ambulatory Visit: Payer: Self-pay | Admitting: *Deleted

## 2020-07-26 ENCOUNTER — Other Ambulatory Visit: Payer: Medicare HMO

## 2020-07-26 ENCOUNTER — Other Ambulatory Visit: Payer: Self-pay

## 2020-07-26 DIAGNOSIS — E039 Hypothyroidism, unspecified: Secondary | ICD-10-CM

## 2020-07-26 DIAGNOSIS — E785 Hyperlipidemia, unspecified: Secondary | ICD-10-CM | POA: Diagnosis not present

## 2020-07-27 LAB — HEPATIC FUNCTION PANEL
ALT: 13 IU/L (ref 0–32)
AST: 19 IU/L (ref 0–40)
Albumin: 4.3 g/dL (ref 3.8–4.8)
Alkaline Phosphatase: 63 IU/L (ref 44–121)
Bilirubin Total: 0.2 mg/dL (ref 0.0–1.2)
Bilirubin, Direct: <0.1 mg/dL (ref 0.00–0.40)
Total Protein: 6.3 g/dL (ref 6.0–8.5)

## 2020-07-27 LAB — LIPID PANEL
Chol/HDL Ratio: 3.2 ratio (ref 0.0–4.4)
Cholesterol, Total: 158 mg/dL (ref 100–199)
HDL: 50 mg/dL (ref >39)
LDL Chol Calc (NIH): 79 mg/dL (ref 0–99)
Triglycerides: 170 mg/dL — ABNORMAL HIGH (ref 0–149)
VLDL Cholesterol Cal: 29 mg/dL (ref 5–40)

## 2020-08-03 MED ORDER — CARVEDILOL 3.125 MG PO TABS: 3.1250 mg | ORAL_TABLET | Freq: Two times a day (BID) | ORAL | 3 refills | Status: DC

## 2020-08-07 NOTE — Progress Notes (Signed)
Cardiology Office Note   Date:  08/09/2020   ID:  Chanay, Nugent 1951/07/20, MRN [id number]  PCP:  [name], DO  Cardiologist:   Minus Breeding, MD   Chief Complaint  Patient presents with  . Palpitations      History of Present Illness: Jasmin Butler is a 69 y.o. female who presents for follow up of chest pain.  He had a recent hospitalization to [facility] for the evaluation of chest pain. She was seen by Dr. Harl Bowie during that admission and underwent an extensive cardiac work-up, including negative HsTrop's, non-ischemic EKG, CTA chest without acute abnormalities or aortic dissection. Echo with EF 60-65%, G1DD, no RWMA, and no significant valvular abnormalities, and NST was without ischemia. Prior to this she underwent a coronary CTA in 2015 which showed a calcium score of 0 with normal coronary arteries.    She was seen recently and had palpitations.  She wore a monitor and had no arrhythmias noted.  She was tried on Coreg.  She said she really felt poorly when she was taking amlodipine could not sleep through the night because of palpitations.  This was stopped and with the carvedilol low-dose she is rarely having palpitations now.  He had no new chest pain, neck or arm discomfort.    Past Medical History:  Diagnosis Date  . Allergy to alpha-gal   . Anxiety   . Cataract   . Depression    "couple times/yr" (09/29/2014)  . GERD (gastroesophageal reflux disease) 2005  . Headache    "weekly" (09/29/2014)  . History of hiatal hernia   . Hyperlipidemia   . IBS (irritable bowel syndrome)   . Migraine    "2-3 times/yr" (09/29/2014)  . Pernicious anemia   . Pneumonia 1980's   "double"  . Thyroid disease     Past Surgical History:  Procedure Laterality Date  . ABDOMINAL HYSTERECTOMY  1985  . CARDIAC CATHETERIZATION  ~ 2005  . CATARACT EXTRACTION W/ INTRAOCULAR LENS  IMPLANT, BILATERAL Bilateral 2000's  . CHOLECYSTECTOMY OPEN  ~ 1990  . EYE  SURGERY    . TUBAL LIGATION  ~ 1983     Current Outpatient Medications  Medication Sig Dispense Refill  . aspirin EC 81 MG EC tablet Take 1 tablet (81 mg total) by mouth daily. Swallow whole. 30 tablet 0  . busPIRone (BUSPAR) 10 MG tablet Take 10 mg by mouth daily as needed.     . carvedilol (COREG) 3.125 MG tablet Take 1 tablet (3.125 mg total) by mouth 2 (two) times daily. 180 tablet 3  . Coenzyme Q10 (COQ10 PO) Take by mouth.    . cyanocobalamin (,VITAMIN B-12,) 1000 MCG/ML injection Inject 1,000 mcg into the muscle every 30 (thirty) days.     Marland Kitchen levothyroxine (SYNTHROID) 75 MCG tablet TAKE 1 TABLET BY MOUTH EVERY DAY 90 tablet 3  . nitroGLYCERIN (NITROSTAT) 0.4 MG SL tablet Place 1 tablet (0.4 mg total) under the tongue every 5 (five) minutes as needed for chest pain. 30 tablet 0  . rosuvastatin (CRESTOR) 40 MG tablet Take 1 tablet (40 mg total) by mouth daily. 90 tablet 3  . EPINEPHrine (EPIPEN 2-PAK) 0.3 mg/0.3 mL IJ SOAJ injection Inject 0.3 mLs (0.3 mg total) into the muscle as needed for anaphylaxis. 1 Device 2   Current Facility-Administered Medications  Medication Dose Route Frequency Provider Last Rate Last Admin  . cyanocobalamin ((VITAMIN B-12)) injection 1,000 mcg  1,000 mcg Intramuscular Q30 days  Ronnie Doss M, DO   1,000 mcg at 07/14/20 0960    Allergies:   Penicillins    ROS:  Please see the history of present illness.   Otherwise, review of systems are positive for none.   All other systems are reviewed and negative.    PHYSICAL EXAM: VS:  BP 102/70   Pulse 60   Ht 5\' 3"  (1.6 m)   Wt 142 lb (64.4 kg)   BMI 25.15 kg/m  , BMI Body mass index is 25.15 kg/m. GENERAL:  Well appearing NECK:  No jugular venous distention, waveform within normal limits, carotid upstroke brisk and symmetric, no bruits, no thyromegaly LUNGS:  Clear to auscultation bilaterally CHEST:  Unremarkable HEART:  PMI not displaced or sustained,S1 and S2 within normal limits, no S3, no S4,  no clicks, no rubs, no murmurs ABD:  Flat, positive bowel sounds normal in frequency in pitch, no bruits, no rebound, no guarding, no midline pulsatile mass, no hepatomegaly, no splenomegaly EXT:  2 plus pulses throughout, no edema, no cyanosis no clubbing   EKG:  EKG is ordered today. The ekg ordered today demonstrates sinus rhythm, rate 60, left axis deviation, nonspecific diffuse T wave flattening.    Recent Labs: 06/12/2020: BUN 8; Creatinine, Ser 0.59; Hemoglobin 12.8; Platelets 252; Potassium 4.7; Sodium 140 06/30/2020: TSH 1.320 07/26/2020: ALT 13    Lipid Panel    Component Value Date/Time   CHOL 158 07/26/2020 0000   TRIG 170 (H) 07/26/2020 0000   HDL 50 07/26/2020 0000   CHOLHDL 3.2 07/26/2020 0000   CHOLHDL 3.9 06/08/2020 0751   VLDL 33 06/08/2020 0751   LDLCALC 79 07/26/2020 0000   LDLDIRECT 101 (H) 05/29/2018 0835      Wt Readings from Last 3 Encounters:  08/09/20 142 lb (64.4 kg)  06/30/20 140 lb 3.2 oz (63.6 kg)  06/14/20 140 lb 12.8 oz (63.9 kg)      Other studies Reviewed: Additional studies/ records that were reviewed today include: Monitor. Review of the above records demonstrates:  Please see elsewhere in the note.     ASSESSMENT AND PLAN:  Palpitations:     She had no sustained arrhythmia on her monitor.  There was some ectopy.  We will manage this with low-dose beta-blocker.   Chest pain:  She has had an extensive work-up.  Is not having any new complaints.  She had no coronary artery disease.  No change in therapy.  No further work-up.  HLD:   LDL was now 79 with an HDL of 50.  She will continue on the meds as listed.   HTN:BP was low today but reports that it is 140s over 80s typically at home.  She will meds as listed.   Hypothyroidism:  This is managed by her primary provider.  We did check a TSH and a T4 which were normal last month.     Current medicines are reviewed at length with the patient today.  The patient does not have  concerns regarding medicines.  The following changes have been made:  no change  Labs/ tests ordered today include: None  Orders Placed This Encounter  Procedures  . EKG 12-Lead     Disposition:   FU with me as needed.      Signed, Minus Breeding, MD  08/09/2020 10:30 AM    Ali Chuk

## 2020-08-09 ENCOUNTER — Other Ambulatory Visit: Payer: Self-pay

## 2020-08-09 ENCOUNTER — Encounter: Payer: Self-pay | Admitting: Cardiology

## 2020-08-09 ENCOUNTER — Ambulatory Visit: Payer: Medicare HMO | Admitting: Cardiology

## 2020-08-09 VITALS — BP 102/70 | HR 60 | Ht 63.0 in | Wt 142.0 lb

## 2020-08-09 DIAGNOSIS — E039 Hypothyroidism, unspecified: Secondary | ICD-10-CM

## 2020-08-09 DIAGNOSIS — E785 Hyperlipidemia, unspecified: Secondary | ICD-10-CM

## 2020-08-09 DIAGNOSIS — I10 Essential (primary) hypertension: Secondary | ICD-10-CM | POA: Diagnosis not present

## 2020-08-09 DIAGNOSIS — R002 Palpitations: Secondary | ICD-10-CM | POA: Diagnosis not present

## 2020-08-09 DIAGNOSIS — R072 Precordial pain: Secondary | ICD-10-CM

## 2020-08-09 NOTE — Patient Instructions (Signed)
Medication Instructions:  The current medical regimen is effective;  continue present plan and medications.  *If you need a refill on your cardiac medications before your next appointment, please call your pharmacy*  Follow-Up: At [facility], you and your health needs are our priority.  As part of our continuing mission to provide you with exceptional heart care, we have created designated Provider Care Teams.  These Care Teams include your primary Cardiologist (physician) and Advanced Practice Providers (APPs -  Physician Assistants and Nurse Practitioners) who all work together to provide you with the care you need, when you need it.  We recommend signing up for the patient portal called "MyChart".  Sign up information is provided on this After Visit Summary.  MyChart is used to connect with patients for Virtual Visits (Telemedicine).  Patients are able to view lab/test results, encounter notes, upcoming appointments, etc.  Non-urgent messages can be sent to your provider as well.   To learn more about what you can do with MyChart, go to [email or web address].    Your next appointment:   Follow up as needed with Dr Hochrein.  Thank you for choosing [facility]!!     

## 2020-08-14 ENCOUNTER — Ambulatory Visit (INDEPENDENT_AMBULATORY_CARE_PROVIDER_SITE_OTHER): Payer: Medicare HMO

## 2020-08-14 ENCOUNTER — Other Ambulatory Visit: Payer: Self-pay

## 2020-08-14 DIAGNOSIS — E538 Deficiency of other specified B group vitamins: Secondary | ICD-10-CM | POA: Diagnosis not present

## 2020-08-14 DIAGNOSIS — Z23 Encounter for immunization: Secondary | ICD-10-CM

## 2020-08-14 NOTE — Progress Notes (Signed)
B12 given and tolerated well °

## 2020-08-15 DIAGNOSIS — R69 Illness, unspecified: Secondary | ICD-10-CM | POA: Diagnosis not present

## 2020-08-19 ENCOUNTER — Encounter (HOSPITAL_COMMUNITY): Payer: Self-pay | Admitting: *Deleted

## 2020-08-19 ENCOUNTER — Emergency Department (HOSPITAL_COMMUNITY)
Admission: EM | Admit: 2020-08-19 | Discharge: 2020-08-19 | Disposition: A | Discharge status: Home / Self Care | Payer: Medicare HMO | Attending: Emergency Medicine | Admitting: Emergency Medicine

## 2020-08-19 DIAGNOSIS — T7840XA Allergy, unspecified, initial encounter: Secondary | ICD-10-CM | POA: Diagnosis not present

## 2020-08-19 DIAGNOSIS — Z7982 Long term (current) use of aspirin: Secondary | ICD-10-CM | POA: Diagnosis not present

## 2020-08-19 DIAGNOSIS — R21 Rash and other nonspecific skin eruption: Secondary | ICD-10-CM | POA: Diagnosis present

## 2020-08-19 DIAGNOSIS — E039 Hypothyroidism, unspecified: Secondary | ICD-10-CM | POA: Insufficient documentation

## 2020-08-19 DIAGNOSIS — L500 Allergic urticaria: Secondary | ICD-10-CM | POA: Diagnosis not present

## 2020-08-19 DIAGNOSIS — Z79899 Other long term (current) drug therapy: Secondary | ICD-10-CM | POA: Diagnosis not present

## 2020-08-19 MED ORDER — METHYLPREDNISOLONE SODIUM SUCC 125 MG IJ SOLR
125.0000 mg | Freq: Once | INTRAMUSCULAR | Status: AC
  Administered 2020-08-19: 125 mg via INTRAVENOUS
  Filled 2020-08-19: qty 2

## 2020-08-19 MED ORDER — PREDNISONE 10 MG PO TABS: 20.0000 mg | ORAL_TABLET | Freq: Every day | ORAL | 0 refills | Status: DC

## 2020-08-19 MED ORDER — FAMOTIDINE 20 MG PO TABS: 20.0000 mg | ORAL_TABLET | Freq: Two times a day (BID) | ORAL | 0 refills | Status: DC

## 2020-08-19 MED ORDER — FAMOTIDINE IN NACL 20-0.9 MG/50ML-% IV SOLN
20.0000 mg | Freq: Once | INTRAVENOUS | Status: AC
  Administered 2020-08-19: 20 mg via INTRAVENOUS
  Filled 2020-08-19: qty 50

## 2020-08-19 MED ORDER — DIPHENHYDRAMINE HCL 50 MG/ML IJ SOLN
25.0000 mg | Freq: Once | INTRAMUSCULAR | Status: AC
  Administered 2020-08-19: 25 mg via INTRAVENOUS
  Filled 2020-08-19: qty 1

## 2020-08-19 NOTE — ED Provider Notes (Signed)
[facility] EMERGENCY DEPARTMENT Provider Note   CSN: [id number] Arrival date & time: 08/19/20  1418     History Chief Complaint  Patient presents with  . Urticaria    Jasmin Butler is a 69 y.o. female.  Patient complains of itching swelling and redness to chest and arms.  This is been going on for couple days.  Patient does not know what she may have been exposed to  The history is provided by the patient. No language interpreter was used.  Urticaria This is a new problem. The current episode started 12 to 24 hours ago. The problem occurs constantly. The problem has not changed since onset.Pertinent negatives include no chest pain, no abdominal pain and no headaches. Nothing aggravates the symptoms. Nothing relieves the symptoms. She has tried nothing for the symptoms. The treatment provided no relief.       Past Medical History:  Diagnosis Date  . Allergy to alpha-gal   . Anxiety   . Cataract   . Depression    "couple times/yr" (09/29/2014)  . GERD (gastroesophageal reflux disease) 2005  . Headache    "weekly" (09/29/2014)  . History of hiatal hernia   . Hyperlipidemia   . IBS (irritable bowel syndrome)   . Migraine    "2-3 times/yr" (09/29/2014)  . Pernicious anemia   . Pneumonia 1980's   "double"  . Thyroid disease     Patient Active Problem List   Diagnosis Date Noted  . Hypothyroidism 11/26/2018  . Pernicious anemia 11/03/2017  . Dysphagia 01/12/2016  . Multiple thyroid nodules 03/17/2015  . Dyspnea 10/27/2014  . Pulmonary infiltrates 10/27/2014  . Thyroid nodule 10/27/2014  . Chest pain   . Hyperlipidemia 02/29/2008  . ANEMIA 02/29/2008  . Depression 02/29/2008  . ARTHRITIS 02/29/2008    Past Surgical History:  Procedure Laterality Date  . ABDOMINAL HYSTERECTOMY  1985  . CARDIAC CATHETERIZATION  ~ 2005  . CATARACT EXTRACTION W/ INTRAOCULAR LENS  IMPLANT, BILATERAL Bilateral 2000's  . CHOLECYSTECTOMY OPEN  ~ 1990  . EYE SURGERY    . TUBAL  LIGATION  ~ 1983     OB History   No obstetric history on file.     Family History  Problem Relation Age of Onset  . Leukemia Father   . Cancer Father        leukemia  . Hypertension Mother   . Stroke Mother 25  . Thyroid disease Mother   . Arthritis Mother   . Cancer Brother        4 different cancers  . Asthma Brother   . Birth defects Son     Social History   Tobacco Use  . Smoking status: Never Smoker  . Smokeless tobacco: Never Used  Vaping Use  . Vaping Use: Never used  Substance Use Topics  . Alcohol use: No  . Drug use: No    Home Medications Prior to Admission medications   Medication Sig Start Date End Date Taking? Authorizing Provider  acetaminophen (TYLENOL) 500 MG tablet Take 500 mg by mouth every 6 (six) hours as needed.   Yes [provider]  busPIRone (BUSPAR) 10 MG tablet Take 10 mg by mouth daily as needed.  07/22/20  Yes [provider]  cyanocobalamin (,VITAMIN B-12,) 1000 MCG/ML injection Inject 1,000 mcg into the muscle every 30 (thirty) days.  11/09/14  Yes [provider]  EPINEPHrine (EPIPEN 2-PAK) 0.3 mg/0.3 mL IJ SOAJ injection Inject 0.3 mLs (0.3 mg total) into the  muscle as needed for anaphylaxis. 02/23/19  Yes Terald Sleeper, PA-C  levothyroxine (SYNTHROID) 75 MCG tablet TAKE 1 TABLET BY MOUTH EVERY DAY 04/18/20  Yes Gottschalk, Ashly M, DO  nitroGLYCERIN (NITROSTAT) 0.4 MG SL tablet Place 1 tablet (0.4 mg total) under the tongue every 5 (five) minutes as needed for chest pain. 06/08/20  Yes Aline August, MD  rosuvastatin (CRESTOR) 40 MG tablet Take 1 tablet (40 mg total) by mouth daily. 06/14/20 09/12/20 Yes Kroeger, Lorelee Cover., PA-C  aspirin EC 81 MG EC tablet Take 1 tablet (81 mg total) by mouth daily. Swallow whole. Patient not taking: Reported on 08/19/2020 06/09/20   Aline August, MD  carvedilol (COREG) 3.125 MG tablet Take 1 tablet (3.125 mg total) by mouth 2 (two) times daily. Patient not taking: Reported on  08/19/2020 08/03/20 11/01/20  Minus Breeding, MD  famotidine (PEPCID) 20 MG tablet Take 1 tablet (20 mg total) by mouth 2 (two) times daily. 08/19/20   Milton Ferguson, MD  predniSONE (DELTASONE) 10 MG tablet Take 2 tablets (20 mg total) by mouth daily. 08/19/20   Milton Ferguson, MD    Allergies    Penicillins  Review of Systems   Review of Systems  Constitutional: Negative for appetite change and fatigue.  HENT: Negative for congestion, ear discharge and sinus pressure.   Eyes: Negative for discharge.  Respiratory: Negative for cough.   Cardiovascular: Negative for chest pain.  Gastrointestinal: Negative for abdominal pain and diarrhea.  Genitourinary: Negative for frequency and hematuria.  Musculoskeletal: Negative for back pain.  Skin: Positive for rash.  Neurological: Negative for seizures and headaches.  Psychiatric/Behavioral: Negative for hallucinations.    Physical Exam Updated Vital Signs BP 135/73   Pulse 75   Temp 98.6 F (37 C)   Resp 19   Ht 5\' 3"  (1.6 m)   Wt 63.5 kg   SpO2 96%   BMI 24.80 kg/m   Physical Exam Vitals and nursing note reviewed.  Constitutional:      Appearance: She is well-developed.  HENT:     Head: Normocephalic.     Nose: Nose normal.  Eyes:     General: No scleral icterus.    Conjunctiva/sclera: Conjunctivae normal.  Neck:     Thyroid: No thyromegaly.  Cardiovascular:     Rate and Rhythm: Normal rate and regular rhythm.     Heart sounds: No murmur heard.  No friction rub. No gallop.   Pulmonary:     Breath sounds: No stridor. No wheezing or rales.  Chest:     Chest wall: No tenderness.  Abdominal:     General: There is no distension.     Tenderness: There is no abdominal tenderness. There is no rebound.  Musculoskeletal:        General: Normal range of motion.     Cervical back: Neck supple.  Lymphadenopathy:     Cervical: No cervical adenopathy.  Skin:    Findings: Erythema and rash present.     Comments: Rash to arms  chest  Neurological:     Mental Status: She is alert and oriented to person, place, and time.     Motor: No abnormal muscle tone.     Coordination: Coordination normal.  Psychiatric:        Behavior: Behavior normal.     ED Results / Procedures / Treatments   Labs (all labs ordered are listed, but only abnormal results are displayed) Labs Reviewed - No data to display  EKG None  Radiology No results found.  Procedures Procedures (including critical care time)  Medications Ordered in ED Medications  methylPREDNISolone sodium succinate (SOLU-MEDROL) 125 mg/2 mL injection 125 mg (125 mg Intravenous Given 08/19/20 1647)  diphenhydrAMINE (BENADRYL) injection 25 mg (25 mg Intravenous Given 08/19/20 1647)  famotidine (PEPCID) IVPB 20 mg premix (0 mg Intravenous Stopped 08/19/20 1724)    ED Course  I have reviewed the triage vital signs and the nursing notes.  Pertinent labs & imaging results that were available during my care of the patient were reviewed by me and considered in my medical decision making (see chart for details).    MDM Rules/Calculators/A&P                          Patient with allergic reaction.  She was given prednisone Benadryl Pepcid and has improved some.  She is sent home with prednisone Pepcid Final Clinical Impression(s) / ED Diagnoses Final diagnoses:  Allergic reaction, initial encounter    Rx / DC Orders ED Discharge Orders         Ordered    predniSONE (DELTASONE) 10 MG tablet  Daily        08/19/20 1810    famotidine (PEPCID) 20 MG tablet  2 times daily        08/19/20 1810           Milton Ferguson, MD 08/20/20 [phone]

## 2020-08-19 NOTE — ED Triage Notes (Signed)
Hives on both arms onset yesterday, took benadryl with some relief

## 2020-08-19 NOTE — Discharge Instructions (Addendum)
Take Benadryl every 4-6 hours for itching and swelling.  Follow-up with your doctor this week for recheck.  Return if problems

## 2020-08-20 ENCOUNTER — Encounter (HOSPITAL_COMMUNITY): Payer: Self-pay

## 2020-08-20 ENCOUNTER — Ambulatory Visit (HOSPITAL_COMMUNITY)
Admission: EM | Admit: 2020-08-20 | Discharge: 2020-08-20 | Disposition: A | Discharge status: Home / Self Care | Payer: Medicare HMO | Attending: Family Medicine | Admitting: Family Medicine

## 2020-08-20 DIAGNOSIS — L239 Allergic contact dermatitis, unspecified cause: Secondary | ICD-10-CM | POA: Diagnosis not present

## 2020-08-20 MED ORDER — TRIAMCINOLONE ACETONIDE 0.1 % EX CREA: 1.0000 "application " | TOPICAL_CREAM | Freq: Two times a day (BID) | CUTANEOUS | 0 refills | Status: DC

## 2020-08-20 NOTE — ED Provider Notes (Signed)
[place]    CSN: [id number] Arrival date & time: 08/20/20  1446      History   Chief Complaint Chief Complaint  Patient presents with   Urticaria    HPI Jasmin Butler is a 69 y.o. female.   Presenting today for evaluation of ongoing itchy rash across several areas of her body the past few days. Went to ED yesterday for same and was given steroid injection, prednisone and pepcid sent to pharmacy and instructed to take benadryl every 4-6 hours which she has been complaint with. Rash has now spread to chest and abdomen since this morning and she is concerned she may be allergic to the pepcid. Denies throat swelling, wheezing or difficulty breathing, fever. She denies any new medications or exposures other than got her booster COVID vaccine over a week ago prior to onset.      Past Medical History:  Diagnosis Date   Allergy to alpha-gal    Anxiety    Cataract    Depression    "couple times/yr" (09/29/2014)   GERD (gastroesophageal reflux disease) 2005   Headache    "weekly" (09/29/2014)   History of hiatal hernia    Hyperlipidemia    IBS (irritable bowel syndrome)    Migraine    "2-3 times/yr" (09/29/2014)   Pernicious anemia    Pneumonia 1980's   "double"   Thyroid disease     Patient Active Problem List   Diagnosis Date Noted   Hypothyroidism 11/26/2018   Pernicious anemia 11/03/2017   Dysphagia 01/12/2016   Multiple thyroid nodules 03/17/2015   Dyspnea 10/27/2014   Pulmonary infiltrates 10/27/2014   Thyroid nodule 10/27/2014   Chest pain    Hyperlipidemia 02/29/2008   ANEMIA 02/29/2008   Depression 02/29/2008   ARTHRITIS 02/29/2008    Past Surgical History:  Procedure Laterality Date   ABDOMINAL HYSTERECTOMY  1985   CARDIAC CATHETERIZATION  ~ 2005   CATARACT EXTRACTION W/ INTRAOCULAR LENS  IMPLANT, BILATERAL Bilateral 2000's   CHOLECYSTECTOMY OPEN  ~ 1990   EYE SURGERY     TUBAL LIGATION  ~ 1983    OB  History   No obstetric history on file.      Home Medications    Prior to Admission medications   Medication Sig Start Date End Date Taking? Authorizing Provider  acetaminophen (TYLENOL) 500 MG tablet Take 500 mg by mouth every 6 (six) hours as needed.    [provider]  aspirin EC 81 MG EC tablet Take 1 tablet (81 mg total) by mouth daily. Swallow whole. Patient not taking: Reported on 08/19/2020 06/09/20   Aline August, MD  busPIRone (BUSPAR) 10 MG tablet Take 10 mg by mouth daily as needed.  07/22/20   [provider]  carvedilol (COREG) 3.125 MG tablet Take 1 tablet (3.125 mg total) by mouth 2 (two) times daily. Patient not taking: Reported on 08/19/2020 08/03/20 11/01/20  Minus Breeding, MD  cyanocobalamin (,VITAMIN B-12,) 1000 MCG/ML injection Inject 1,000 mcg into the muscle every 30 (thirty) days.  11/09/14   [provider]  EPINEPHrine (EPIPEN 2-PAK) 0.3 mg/0.3 mL IJ SOAJ injection Inject 0.3 mLs (0.3 mg total) into the muscle as needed for anaphylaxis. 02/23/19   Terald Sleeper, PA-C  famotidine (PEPCID) 20 MG tablet Take 1 tablet (20 mg total) by mouth 2 (two) times daily. 08/19/20   Milton Ferguson, MD  levothyroxine (SYNTHROID) 75 MCG tablet TAKE 1 TABLET BY MOUTH EVERY DAY 04/18/20   Ronnie Doss  M, DO  nitroGLYCERIN (NITROSTAT) 0.4 MG SL tablet Place 1 tablet (0.4 mg total) under the tongue every 5 (five) minutes as needed for chest pain. 06/08/20   Aline August, MD  predniSONE (DELTASONE) 10 MG tablet Take 2 tablets (20 mg total) by mouth daily. 08/19/20   Milton Ferguson, MD  rosuvastatin (CRESTOR) 40 MG tablet Take 1 tablet (40 mg total) by mouth daily. 06/14/20 09/12/20  Kroeger, Lorelee Cover., PA-C  triamcinolone cream (KENALOG) 0.1 % Apply 1 application topically 2 (two) times daily. 08/20/20   Volney American, PA-C    Family History Family History  Problem Relation Age of Onset   Leukemia Father    Cancer Father        leukemia    Hypertension Mother    Stroke Mother 45   Thyroid disease Mother    Arthritis Mother    Cancer Brother        69 different cancers   Asthma Brother    Birth defects Son     Social History Social History   Tobacco Use   Smoking status: Never Smoker   Smokeless tobacco: Never Used  Scientific laboratory technician Use: Never used  Substance Use Topics   Alcohol use: No   Drug use: No     Allergies   Penicillins   Review of Systems Review of Systems PER HPI    Physical Exam Triage Vital Signs ED Triage Vitals [08/20/20 1507]  Enc Vitals Group     BP 134/83     Pulse Rate (!) 103     Resp 18     Temp 98.7 F (37.1 C)     Temp Source Oral     SpO2 96 %     Weight      Height      Head Circumference      Peak Flow      Pain Score      Pain Loc      Pain Edu?      Excl. in [place]?    No data found.  Updated Vital Signs BP 134/83 (BP Location: Right Arm)    Pulse (!) 103    Temp 98.7 F (37.1 C) (Oral)    Resp 18    SpO2 96%   Visual Acuity Right Eye Distance:   Left Eye Distance:   Bilateral Distance:    Right Eye Near:   Left Eye Near:    Bilateral Near:     Physical Exam Vitals and nursing note reviewed.  Constitutional:      Appearance: Normal appearance. She is not ill-appearing.  HENT:     Head: Atraumatic.     Mouth/Throat:     Mouth: Mucous membranes are moist.     Pharynx: Oropharynx is clear.  Eyes:     Extraocular Movements: Extraocular movements intact.     Conjunctiva/sclera: Conjunctivae normal.  Cardiovascular:     Rate and Rhythm: Normal rate and regular rhythm.     Heart sounds: Normal heart sounds.  Pulmonary:     Effort: Pulmonary effort is normal.     Breath sounds: Normal breath sounds. No wheezing or rales.  Musculoskeletal:        General: Normal range of motion.     Cervical back: Normal range of motion and neck supple.  Skin:    General: Skin is warm and dry.     Findings: Rash (erythematous maculopapuler rash  present b/l knees, upper thighs, lower abdomen  in small area, chest, scalp) present.  Neurological:     Mental Status: She is alert and oriented to person, place, and time.  Psychiatric:        Mood and Affect: Mood normal.        Thought Content: Thought content normal.        Judgment: Judgment normal.     UC Treatments / Results  Labs (all labs ordered are listed, but only abnormal results are displayed) Labs Reviewed - No data to display  EKG   Radiology No results found.  Procedures Procedures (including critical care time)  Medications Ordered in UC Medications - No data to display  Initial Impression / Assessment and Plan / UC Course  I have reviewed the triage vital signs and the nursing notes.  Pertinent labs & imaging results that were available during my care of the patient were reviewed by me and considered in my medical decision making (see chart for details).     Unclear cause, possibly vaccine reaction but no clear way of knowing at this time. Continue home regimen of prednisone, pepcid, benadryl, triamcinolone cream given and reassurance given to pt. Vitals, exam reassuring for no respiratory distress from reaction. Return if not resolving over next week or so or worsening over time.   Final Clinical Impressions(s) / UC Diagnoses   Final diagnoses:  Allergic dermatitis   Discharge Instructions   None    ED Prescriptions    Medication Sig Dispense Auth. Provider   triamcinolone cream (KENALOG) 0.1 % Apply 1 application topically 2 (two) times daily. 60 g Volney American, [place]     PDMP not reviewed this encounter.   Volney American, [place] 08/20/20 [phone]

## 2020-08-20 NOTE — ED Triage Notes (Addendum)
Pt reports she went to the Ed yesterday, as she was having itchiness, redness and bumps all over the body. Pt states the left ear started feeling sore and then she break out in hives. States she was feeling better when took the prednisone prescribed at the ED, but started breaking out again when took the Pepcid around 12 pm today. Denies new food, new detergents, lotions,.

## 2020-08-25 ENCOUNTER — Encounter: Payer: Self-pay | Admitting: Family Medicine

## 2020-08-30 ENCOUNTER — Ambulatory Visit (INDEPENDENT_AMBULATORY_CARE_PROVIDER_SITE_OTHER): Payer: Medicare HMO

## 2020-08-30 DIAGNOSIS — Z0000 Encounter for general adult medical examination without abnormal findings: Secondary | ICD-10-CM | POA: Diagnosis not present

## 2020-08-30 NOTE — Patient Instructions (Signed)
  Ms. Bloodworth , Thank you for taking time to come for your Medicare Wellness Visit. I appreciate your ongoing commitment to your health goals. Please review the following plan we discussed and let me know if I can assist you in the future.   These are the goals we discussed: Goals    . DIET - EAT MORE FRUITS AND VEGETABLES    . Exercise 150 min/wk Moderate Activity       This is a list of the screening recommended for you and due dates:  Health Maintenance  Topic Date Due  . Cologuard (Stool DNA test)  12/03/2021  . Tetanus Vaccine  06/19/2028  . Flu Shot  Completed  . COVID-19 Vaccine  Completed  .  Hepatitis C: One time screening is recommended by Center for Disease Control  (CDC) for  adults born from [birth year] through 1967.   Completed  . Pneumonia vaccines  Completed  . Mammogram  Discontinued  . DEXA scan (bone density measurement)  Discontinued

## 2020-08-30 NOTE — Progress Notes (Signed)
MEDICARE ANNUAL WELLNESS VISIT  08/30/2020  Telephone Visit Disclaimer This Medicare AWV was conducted by telephone due to national recommendations for restrictions regarding the COVID-19 Pandemic (e.g. social distancing).  I verified, using two identifiers, that I am speaking with Jasmin Butler or their authorized healthcare agent. I discussed the limitations, risks, security, and privacy concerns of performing an evaluation and management service by telephone and the potential availability of an in-person appointment in the future. The patient expressed understanding and agreed to proceed.  Location of Patient: Home Location of Provider (nurse):  [facility]  Subjective:    Jasmin Butler is a 69 y.o. female patient of [name], DO who had a Medicare Annual Wellness Visit today via telephone. Jasmin Butler lives here in [place] and is a very pleasant lady. She is retired and worked at [employer] for 30 years. She is a widow and lives alone. She has 3 children. 1 boy and 2 girls. Her sone currently lives in [place], daughter in [place], and the other daughter lives nearby to her. She has 2 dogs that she enjoys. She also enjoys quilting and playing games on her IPad. She feels her health is worse than it was this time last year due to some heart issues that she has been having. Her health maintenance is up to date. She is due for a mammogram and bone density but patient declines both.   Patient Care Team: [name], DO as PCP - General (Family Medicine) Minus Breeding, MD as PCP - Cardiology (Cardiology)  Advanced Directives 08/30/2020 08/19/2020 06/07/2020 06/23/2019 11/29/2018 09/29/2014 09/29/2014  Does Patient Have a Medical Advance Directive? No No No No No No No  Would patient like information on creating a medical advance directive? No - Patient declined - No - Patient declined No - Patient declined - No - patient declined information -     Hospital Utilization Over the Past 12 Months: # of hospitalizations or ER visits: 0 # of surgeries: 0  Review of Systems    Patient reports that her overall health is worse compared to last year.  History obtained from chart review  Patient Reported Readings (BP, Pulse, CBG, Weight, etc) none  Pain Assessment Pain : No/denies pain     Current Medications & Allergies (verified) Allergies as of 08/30/2020      Reactions   Penicillins Rash   Did it involve swelling of the face/tongue/throat, SOB, or low BP? Yes Did it involve sudden or severe rash/hives, skin peeling, or any reaction on the inside of your mouth or nose? Unk Did you need to seek medical attention at a hospital or doctor's office? Yes When did it last happen? 30 years ago If all above answers are "NO", may proceed with cephalosporin use.      Medication List       Accurate as of August 30, 2020 10:53 AM. If you have any questions, ask your nurse or doctor.        STOP taking these medications   famotidine 20 MG tablet Commonly known as: Pepcid   predniSONE 10 MG tablet Commonly known as: DELTASONE     TAKE these medications   acetaminophen 500 MG tablet Commonly known as: TYLENOL Take 500 mg by mouth every 6 (six) hours as needed.   aspirin 81 MG EC tablet Take 1 tablet (81 mg total) by mouth daily. Swallow whole.   busPIRone 10 MG tablet Commonly known as: BUSPAR Take 10  mg by mouth daily as needed.   carvedilol 3.125 MG tablet Commonly known as: COREG Take 1 tablet (3.125 mg total) by mouth 2 (two) times daily.   cyanocobalamin 1000 MCG/ML injection Commonly known as: (VITAMIN B-12) Inject 1,000 mcg into the muscle every 30 (thirty) days.   EPINEPHrine 0.3 mg/0.3 mL Soaj injection Commonly known as: EpiPen 2-Pak Inject 0.3 mLs (0.3 mg total) into the muscle as needed for anaphylaxis.   levothyroxine 75 MCG tablet Commonly known as: SYNTHROID TAKE 1 TABLET BY MOUTH EVERY DAY    nitroGLYCERIN 0.4 MG SL tablet Commonly known as: NITROSTAT Place 1 tablet (0.4 mg total) under the tongue every 5 (five) minutes as needed for chest pain.   rosuvastatin 40 MG tablet Commonly known as: CRESTOR Take 1 tablet (40 mg total) by mouth daily.   triamcinolone 0.1 % Commonly known as: KENALOG Apply 1 application topically 2 (two) times daily.       History (reviewed): Past Medical History:  Diagnosis Date  . Allergy to alpha-gal   . Anxiety   . Cataract   . Depression    "couple times/yr" (09/29/2014)  . GERD (gastroesophageal reflux disease) 2005  . Headache    "weekly" (09/29/2014)  . History of hiatal hernia   . Hyperlipidemia   . IBS (irritable bowel syndrome)   . Migraine    "2-3 times/yr" (09/29/2014)  . Pernicious anemia   . Pneumonia 1980's   "double"  . Thyroid disease    Past Surgical History:  Procedure Laterality Date  . ABDOMINAL HYSTERECTOMY  1985  . CARDIAC CATHETERIZATION  ~ 2005  . CATARACT EXTRACTION W/ INTRAOCULAR LENS  IMPLANT, BILATERAL Bilateral 2000's  . CHOLECYSTECTOMY OPEN  ~ 1990  . EYE SURGERY    . TUBAL LIGATION  ~ 1983   Family History  Problem Relation Age of Onset  . Leukemia Father   . Cancer Father        leukemia  . Hypertension Mother   . Stroke Mother 73  . Arthritis Mother   . Cancer Mother   . Cancer Brother        4 different cancers  . Asthma Brother   . Birth defects Son    Social History   Socioeconomic History  . Marital status: Widowed    Spouse name: Not on file  . Number of children: 3  . Years of education: GED  . Highest education level: GED or equivalent  Occupational History  . Occupation: Retired- Engineer, materials  Tobacco Use  . Smoking status: Never Smoker  . Smokeless tobacco: Never Used  Vaping Use  . Vaping Use: Never used  Substance and Sexual Activity  . Alcohol use: No  . Drug use: No  . Sexual activity: Not Currently  Other Topics Concern  . Not on file  Social History  Narrative  . Not on file   Social Determinants of Health   Financial Resource Strain:   . Difficulty of Paying Living Expenses: Not on file  Food Insecurity:   . Worried About Charity fundraiser in the Last Year: Not on file  . Ran Out of Food in the Last Year: Not on file  Transportation Needs:   . Lack of Transportation (Medical): Not on file  . Lack of Transportation (Non-Medical): Not on file  Physical Activity:   . Days of Exercise per Week: Not on file  . Minutes of Exercise per Session: Not on file  Stress:   . Feeling  of Stress : Not on file  Social Connections:   . Frequency of Communication with Friends and Family: Not on file  . Frequency of Social Gatherings with Friends and Family: Not on file  . Attends Religious Services: Not on file  . Active Member of Clubs or Organizations: Not on file  . Attends Archivist Meetings: Not on file  . Marital Status: Not on file    Activities of Daily Living In your present state of health, do you have any difficulty performing the following activities: 08/30/2020 06/07/2020  Hearing? N N  Vision? N N  Difficulty concentrating or making decisions? N N  Walking or climbing stairs? N N  Dressing or bathing? N N  Doing errands, shopping? N N  Preparing Food and eating ? N -  Using the Toilet? N -  In the past six months, have you accidently leaked urine? N -  Do you have problems with loss of bowel control? N -  Managing your Medications? N -  Managing your Finances? N -  Housekeeping or managing your Housekeeping? N -  Some recent data might be hidden    Patient Education/ Literacy How often do you need to have someone help you when you read instructions, pamphlets, or other written materials from your doctor or pharmacy?: 1 - Never What is the last grade level you completed in school?: 10th grade  Exercise Current Exercise Habits: Home exercise routine, Type of exercise: walking, Time (Minutes): 30, Frequency  (Times/Week): 7, Weekly Exercise (Minutes/Week): 210, Intensity: Mild  Diet Patient reports consuming 3 meals a day and 2 snack(s) a day Patient reports that her primary diet is: Regular Patient reports that she does have regular access to food.   Depression Screen PHQ 2/9 Scores 08/30/2020 06/12/2020 04/24/2020 08/10/2019 06/23/2019 02/23/2019 11/24/2018  PHQ - 2 Score 0 0 1 0 0 0 0  PHQ- 9 Score - - 1 - - - -     Fall Risk Fall Risk  08/30/2020 06/12/2020 04/24/2020 08/10/2019 06/23/2019  Falls in the past year? 0 0 0 0 0     Objective:  Jasmin Butler seemed alert and oriented and she participated appropriately during our telephone visit.  Blood Pressure Weight BMI  BP Readings from Last 3 Encounters:  08/20/20 134/83  08/19/20 124/62  08/09/20 102/70   Wt Readings from Last 3 Encounters:  08/19/20 140 lb (63.5 kg)  08/09/20 142 lb (64.4 kg)  06/30/20 140 lb 3.2 oz (63.6 kg)   BMI Readings from Last 1 Encounters:  08/19/20 24.80 kg/m    *Unable to obtain current vital signs, weight, and BMI due to telephone visit type  Hearing/Vision  . Jasmin Butler did not seem to have difficulty with hearing/understanding during the telephone conversation . Reports that she has had a formal eye exam by an eye care professional within the past year . Reports that she has not had a formal hearing evaluation within the past year *Unable to fully assess hearing and vision during telephone visit type  Cognitive Function: 6CIT Screen 08/30/2020 06/23/2019  What Year? 0 points 0 points  What month? 0 points 0 points  What time? 0 points 0 points  Count back from 20 0 points 0 points  Months in reverse 0 points 0 points  Repeat phrase 0 points 0 points  Total Score 0 0   (Normal:0-7, Significant for Dysfunction: >8)  Normal Cognitive Function Screening: Yes   Immunization & Health Maintenance Record Immunization History  Administered Date(s) Administered  . Fluad Quad(high Dose 65+) 06/14/2019,  07/14/2020  . Influenza, High Dose Seasonal PF 07/15/2018  . Influenza,inj,Quad PF,6+ Mos 08/14/2020  . Influenza-Unspecified 08/14/2014, 08/28/2016, 07/23/2017  . Moderna SARS-COVID-2 Vaccination 11/18/2019, 12/16/2019  . Pneumococcal Conjugate-13 12/12/2015  . Pneumococcal Polysaccharide-23 02/27/2017  . Tdap 06/19/2018    Health Maintenance  Topic Date Due  . Fecal DNA (Cologuard)  12/03/2021  . TETANUS/TDAP  06/19/2028  . INFLUENZA VACCINE  Completed  . COVID-19 Vaccine  Completed  . Hepatitis C Screening  Completed  . PNA vac Low Risk Adult  Completed  . MAMMOGRAM  Discontinued  . DEXA SCAN  Discontinued       Assessment  This is a routine wellness examination for Jasmin Butler.  Health Maintenance: Due or Overdue There are no preventive care reminders to display for this patient.  Jasmin Butler does not need a referral for Community Assistance: Care Management:   no Social Work:    no Prescription Assistance:  no Nutrition/Diabetes Education:  no   Plan:  Personalized Goals Goals Addressed            This Visit's Progress   . DIET - EAT MORE FRUITS AND VEGETABLES        Personalized Health Maintenance & Screening Recommendations  Patient is due for a mammogram and bone density but declines both  Lung Cancer Screening Recommended: no (Low Dose CT Chest recommended if Age 72-80 years, 30 pack-year currently smoking OR have quit w/in past 15 years) Hepatitis C Screening recommended: done HIV Screening recommended: no  Advanced Directives: Written information was not prepared per patient's request.  Referrals & Orders No orders of the defined types were placed in this encounter.   Follow-up Plan . Follow-up with [name], DO as planned    I have personally reviewed and noted the following in the patient's chart:   . Medical and social history . Use of alcohol, tobacco or illicit drugs  . Current medications and  supplements . Functional ability and status . Nutritional status . Physical activity . Advanced directives . List of other physicians . Hospitalizations, surgeries, and ER visits in previous 12 months . Vitals . Screenings to include cognitive, depression, and falls . Referrals and appointments  In addition, I have reviewed and discussed with Jasmin Butler certain preventive protocols, quality metrics, and best practice recommendations. A written personalized care plan for preventive services as well as general preventive health recommendations is available and can be mailed to the patient at her request.      Rolena Infante LPN 14/48/1856

## 2020-09-14 ENCOUNTER — Other Ambulatory Visit: Payer: Self-pay

## 2020-09-14 ENCOUNTER — Ambulatory Visit (INDEPENDENT_AMBULATORY_CARE_PROVIDER_SITE_OTHER): Payer: Medicare HMO

## 2020-09-14 DIAGNOSIS — E538 Deficiency of other specified B group vitamins: Secondary | ICD-10-CM

## 2020-09-14 NOTE — Progress Notes (Signed)
Cyanocobalamin injection given to left deltoid.  Patient tolerated well. 

## 2020-09-29 ENCOUNTER — Encounter: Payer: Self-pay | Admitting: Family Medicine

## 2020-10-04 ENCOUNTER — Ambulatory Visit: Payer: Medicare HMO | Admitting: Nurse Practitioner

## 2020-10-16 ENCOUNTER — Other Ambulatory Visit: Payer: Self-pay

## 2020-10-16 ENCOUNTER — Ambulatory Visit (INDEPENDENT_AMBULATORY_CARE_PROVIDER_SITE_OTHER): Payer: Medicare HMO

## 2020-10-16 DIAGNOSIS — E538 Deficiency of other specified B group vitamins: Secondary | ICD-10-CM

## 2020-10-16 NOTE — Progress Notes (Signed)
Cyanocobalamin injection given to right deltoid.  Patient tolerated well. 

## 2020-10-17 ENCOUNTER — Other Ambulatory Visit: Payer: Self-pay | Admitting: *Deleted

## 2020-10-17 MED ORDER — BUSPIRONE HCL 10 MG PO TABS
10.0000 mg | ORAL_TABLET | Freq: Two times a day (BID) | ORAL | 1 refills | Status: DC
Start: 2020-10-17 — End: 2021-08-31

## 2020-10-25 ENCOUNTER — Other Ambulatory Visit: Payer: Self-pay

## 2020-10-25 ENCOUNTER — Ambulatory Visit (INDEPENDENT_AMBULATORY_CARE_PROVIDER_SITE_OTHER): Payer: Medicare HMO | Admitting: Family Medicine

## 2020-10-25 ENCOUNTER — Encounter: Payer: Self-pay | Admitting: Family Medicine

## 2020-10-25 VITALS — BP 137/77 | HR 67 | Temp 97.7°F | Ht 63.0 in | Wt 141.0 lb

## 2020-10-25 DIAGNOSIS — L509 Urticaria, unspecified: Secondary | ICD-10-CM

## 2020-10-25 DIAGNOSIS — E782 Mixed hyperlipidemia: Secondary | ICD-10-CM

## 2020-10-25 DIAGNOSIS — E034 Atrophy of thyroid (acquired): Secondary | ICD-10-CM

## 2020-10-25 MED ORDER — PREDNISONE 10 MG (21) PO TBPK: ORAL_TABLET | ORAL | 0 refills | Status: DC

## 2020-10-25 MED ORDER — HYDROXYZINE HCL 10 MG PO TABS: 10.0000 mg | ORAL_TABLET | Freq: Three times a day (TID) | ORAL | 3 refills | Status: DC | PRN

## 2020-10-25 MED ORDER — FAMOTIDINE 20 MG PO TABS: 20.0000 mg | ORAL_TABLET | Freq: Two times a day (BID) | ORAL | 3 refills | Status: DC

## 2020-10-25 MED ORDER — CETIRIZINE HCL 10 MG PO TABS: 10.0000 mg | ORAL_TABLET | Freq: Every day | ORAL | 11 refills | Status: DC

## 2020-10-25 NOTE — Patient Instructions (Signed)
Use prednisone if needed for severe skin reaction  We talked at length about avoidance of red meat, pork as this will gradually worsen red meat allergy  I have given you Pepcid to take twice daily, Zyrtec to take at nighttime and Atarax to use up to 3 times daily for breakthrough itching.  Do not use Benadryl if you are using the Atarax 10 mg as they are similar medicines.  If you develop any shortness of breath, facial swelling or severe vomiting you should seek immediate medical attention  I have placed a referral to [facility] and they should contact you within the next week or 2 for an appointment.  Please contact Loma Sousa if you do not receive a call  You had labs performed today.  You will be contacted with the results of the labs once they are available, usually in the next 3 business days for routine lab work.  If you have an active my chart account, they will be released to your MyChart.  If you prefer to have these labs released to you via telephone, please let us know.  If you had a pap smear or biopsy performed, expect to be contacted in about 7-10 days.

## 2020-10-25 NOTE — Progress Notes (Signed)
Subjective: CC: Follow-up hypothyroidism PCP: Jasmin Norlander, DO LOV:FIEP Jasmin Butler is a 70 y.o. female presenting to clinic today for:  1.  Urticaria Patient reports severe itching that has been intermittent for the last several months.  She shows me a picture which shows quite a bit of raised wheals along the upper extremities and legs.  She has been using children's Benadryl intermittently for this.  She actually sought evaluation in the emergency department once and was placed on steroids.  She does have known alpha gal but has not noticed any correlation with intermittent consumption of meat to the intermittent flares of her hives.  She has had no facial swelling, shortness of breath or wheezing  2. hypothyroidism Patient is compliant with Synthroid.  Denies any change in voice tremor or heart palpitations  3. hyperlipidemia Patient's Crestor was increased last visit.  No chest pain, shortness of breath.  She is fasting would like to have those labs collected today  ROS: Per HPI  Allergies  Allergen Reactions  . Penicillins Rash    Did it involve swelling of the face/tongue/throat, SOB, or low BP? Yes Did it involve sudden or severe rash/hives, skin peeling, or any reaction on the inside of your mouth or nose? Unk Did you need to seek medical attention at a hospital or doctor's office? Yes When did it last happen? 30 years ago If all above answers are "NO", may proceed with cephalosporin use.    Past Medical History:  Diagnosis Date  . Allergy to alpha-gal   . Anxiety   . Cataract   . Depression    "couple times/yr" (09/29/2014)  . GERD (gastroesophageal reflux disease) 2005  . Headache    "weekly" (09/29/2014)  . History of hiatal hernia   . Hyperlipidemia   . IBS (irritable bowel syndrome)   . Migraine    "2-3 times/yr" (09/29/2014)  . Pernicious anemia   . Pneumonia 1980's   "double"  . Thyroid disease     Current Outpatient Medications:  .   acetaminophen (TYLENOL) 500 MG tablet, Take 500 mg by mouth every 6 (six) hours as needed., Disp: , Rfl:  .  aspirin EC 81 MG EC tablet, Take 1 tablet (81 mg total) by mouth daily. Swallow whole. (Patient not taking: Reported on 08/19/2020), Disp: 30 tablet, Rfl: 0 .  busPIRone (BUSPAR) 10 MG tablet, Take 1 tablet (10 mg total) by mouth 2 (two) times daily., Disp: 180 tablet, Rfl: 1 .  carvedilol (COREG) 3.125 MG tablet, Take 1 tablet (3.125 mg total) by mouth 2 (two) times daily. (Patient not taking: Reported on 08/19/2020), Disp: 180 tablet, Rfl: 3 .  cyanocobalamin (,VITAMIN B-12,) 1000 MCG/ML injection, Inject 1,000 mcg into the muscle every 30 (thirty) days. , Disp: , Rfl:  .  EPINEPHrine (EPIPEN 2-PAK) 0.3 mg/0.3 mL IJ SOAJ injection, Inject 0.3 mLs (0.3 mg total) into the muscle as needed for anaphylaxis., Disp: 1 Device, Rfl: 2 .  levothyroxine (SYNTHROID) 75 MCG tablet, TAKE 1 TABLET BY MOUTH EVERY DAY, Disp: 90 tablet, Rfl: 3 .  nitroGLYCERIN (NITROSTAT) 0.4 MG SL tablet, Place 1 tablet (0.4 mg total) under the tongue every 5 (five) minutes as needed for chest pain., Disp: 30 tablet, Rfl: 0 .  rosuvastatin (CRESTOR) 40 MG tablet, Take 1 tablet (40 mg total) by mouth daily., Disp: 90 tablet, Rfl: 3 .  triamcinolone cream (KENALOG) 0.1 %, Apply 1 application topically 2 (two) times daily., Disp: 60 g, Rfl: 0  Current  Facility-Administered Medications:  .  cyanocobalamin ((VITAMIN B-12)) injection 1,000 mcg, 1,000 mcg, Intramuscular, Q30 days, Jasmin Butler M, DO, 1,000 mcg at 10/16/20 0930 Social History   Socioeconomic History  . Marital status: Widowed    Spouse name: Not on file  . Number of children: 3  . Years of education: GED  . Highest education level: GED or equivalent  Occupational History  . Occupation: Retired- Engineer, materials  Tobacco Use  . Smoking status: Never Smoker  . Smokeless tobacco: Never Used  Vaping Use  . Vaping Use: Never used  Substance and Sexual  Activity  . Alcohol use: No  . Drug use: No  . Sexual activity: Not Currently  Other Topics Concern  . Not on file  Social History Narrative  . Not on file   Social Determinants of Health   Financial Resource Strain: Not on file  Food Insecurity: Not on file  Transportation Needs: Not on file  Physical Activity: Not on file  Stress: Not on file  Social Connections: Not on file  Intimate Partner Violence: Not on file   Family History  Problem Relation Age of Onset  . Leukemia Father   . Cancer Father        leukemia  . Hypertension Mother   . Stroke Mother 6  . Arthritis Mother   . Cancer Mother   . Cancer Brother        4 different cancers  . Asthma Brother   . Birth defects Son     Objective: Office vital signs reviewed. BP 137/77   Pulse 67   Temp 97.7 F (36.5 C) (Temporal)   Ht _0  (1.6 m)   Wt 141 lb (64 kg)   SpO2 98%   BMI 24.98 kg/m   Physical Examination:  General: Awake, alert, well nourished, No acute distress HEENT: Normal; no facial swelling; no exophthalmos or goiter Cardio: regular rate and rhythm, S1S2 heard, no murmurs appreciated Pulm: clear to auscultation bilaterally, no wheezes, rhonchi or rales; normal work of breathing on room air  Assessment/ Plan: 70 y.o. female   Urticaria - Plan: predniSONE (STERAPRED UNI-PAK 21 TAB) 10 MG (21) TBPK tablet, famotidine (PEPCID) 20 MG tablet, cetirizine (ZYRTEC) 10 MG tablet, hydrOXYzine (ATARAX/VISTARIL) 10 MG tablet, Ambulatory referral to Allergy, [id number]+EGFR, CBC with Differential  Hypothyroidism due to acquired atrophy of thyroid - Plan: Thyroid Panel With TSH  Mixed hyperlipidemia - Plan: Lipid Panel  Uncertain etiology of her urticaria.  She does have known alpha gal and had been intermittently eating some meats but the onset of her itching did not correlate with the timing of her consumption of meat.  I am going to empirically treat her with Pepcid, Zyrtec, Atarax.  I highly recommend  that she stay away from red meat and pork totally.  I placed a referral to allergy to evaluate her as well.  CMP, CBC obtained.  She understands red flag signs and symptoms warranting further evaluation emergency department.  She has an EpiPen and it is in date.  Prednisone Dosepak provided for worsening of symptoms but I have advised her to hold off on this for now  She is asymptomatic from a thyroid standpoint.  Check thyroid panel  She is fasting today would like a repeat lipid panel  No orders of the defined types were placed in this encounter.  No orders of the defined types were placed in this encounter.    Jasmin Norlander, DO [place] [phone])  [phone]   

## 2020-10-26 LAB — CBC WITH DIFFERENTIAL/PLATELET
Basophils Absolute: 0.1 10*3/uL (ref 0.0–0.2)
Basos: 1 %
EOS (ABSOLUTE): 0.1 10*3/uL (ref 0.0–0.4)
Eos: 3 %
Hematocrit: 38.9 % (ref 34.0–46.6)
Hemoglobin: 13 g/dL (ref 11.1–15.9)
Immature Grans (Abs): 0 10*3/uL (ref 0.0–0.1)
Immature Granulocytes: 0 %
Lymphocytes Absolute: 2 10*3/uL (ref 0.7–3.1)
Lymphs: 36 %
MCH: 28.6 pg (ref 26.6–33.0)
MCHC: 33.4 g/dL (ref 31.5–35.7)
MCV: 86 fL (ref 79–97)
Monocytes Absolute: 0.4 10*3/uL (ref 0.1–0.9)
Monocytes: 7 %
Neutrophils Absolute: 3 10*3/uL (ref 1.4–7.0)
Neutrophils: 53 %
Platelets: 229 10*3/uL (ref 150–450)
RBC: 4.55 x10E6/uL (ref 3.77–5.28)
RDW: 13.4 % (ref 11.7–15.4)
WBC: 5.6 10*3/uL (ref 3.4–10.8)

## 2020-10-26 LAB — CMP14+EGFR
ALT: 15 IU/L (ref 0–32)
AST: 27 IU/L (ref 0–40)
Albumin/Globulin Ratio: 1.8 (ref 1.2–2.2)
Albumin: 4.5 g/dL (ref 3.7–4.7)
Alkaline Phosphatase: 67 IU/L (ref 44–121)
BUN/Creatinine Ratio: 14 (ref 12–28)
BUN: 10 mg/dL (ref 8–27)
Bilirubin Total: 0.2 mg/dL (ref 0.0–1.2)
CO2: 26 mmol/L (ref 20–29)
Calcium: 9.6 mg/dL (ref 8.7–10.3)
Chloride: 102 mmol/L (ref 96–106)
Creatinine, Ser: 0.74 mg/dL (ref 0.57–1.00)
GFR calc Af Amer: 94 mL/min/{1.73_m2} (ref >59)
GFR calc non Af Amer: 82 mL/min/{1.73_m2} (ref >59)
Globulin, Total: 2.5 g/dL (ref 1.5–4.5)
Glucose: 99 mg/dL (ref 65–99)
Potassium: 4.6 mmol/L (ref 3.5–5.2)
Sodium: 141 mmol/L (ref 134–144)
Total Protein: 7 g/dL (ref 6.0–8.5)

## 2020-10-26 LAB — LIPID PANEL
Chol/HDL Ratio: 3 ratio (ref 0.0–4.4)
Cholesterol, Total: 172 mg/dL (ref 100–199)
HDL: 58 mg/dL (ref >39)
LDL Chol Calc (NIH): 84 mg/dL (ref 0–99)
Triglycerides: 177 mg/dL — ABNORMAL HIGH (ref 0–149)
VLDL Cholesterol Cal: 30 mg/dL (ref 5–40)

## 2020-10-26 LAB — THYROID PANEL WITH TSH
Free Thyroxine Index: 2.3 (ref 1.2–4.9)
T3 Uptake Ratio: 28 % (ref 24–39)
T4, Total: 8.1 ug/dL (ref 4.5–12.0)
TSH: 0.39 u[IU]/mL — ABNORMAL LOW (ref 0.450–4.500)

## 2020-11-15 ENCOUNTER — Other Ambulatory Visit: Payer: Self-pay

## 2020-11-15 ENCOUNTER — Other Ambulatory Visit: Payer: Medicare HMO

## 2020-11-15 ENCOUNTER — Ambulatory Visit (INDEPENDENT_AMBULATORY_CARE_PROVIDER_SITE_OTHER): Payer: Medicare HMO | Admitting: Allergy & Immunology

## 2020-11-15 ENCOUNTER — Encounter: Payer: Self-pay | Admitting: Family Medicine

## 2020-11-15 ENCOUNTER — Encounter: Payer: Self-pay | Admitting: Allergy & Immunology

## 2020-11-15 VITALS — BP 136/66 | HR 60 | Temp 98.1°F | Resp 16 | Ht 63.0 in | Wt 143.8 lb

## 2020-11-15 DIAGNOSIS — L508 Other urticaria: Secondary | ICD-10-CM

## 2020-11-15 DIAGNOSIS — L500 Allergic urticaria: Secondary | ICD-10-CM

## 2020-11-15 DIAGNOSIS — R748 Abnormal levels of other serum enzymes: Secondary | ICD-10-CM | POA: Diagnosis not present

## 2020-11-15 MED ORDER — EPINEPHRINE 0.3 MG/0.3ML IJ SOAJ: 0.3000 mg | INTRAMUSCULAR | 2 refills | Status: DC | PRN

## 2020-11-15 NOTE — Progress Notes (Signed)
NEW PATIENT  Date of Service/Encounter:  11/15/20  Referring provider: [name], DO   Assessment:   Chronic urticaria  Allergic urticaria  Plan/Recommendations:   1. Chronic urticaria - Testing was positive only to weeds, trees, one mold, and dog. - None of these explain your symptoms. - Copy of testing results provided. - Your history does not have any "red flags" such as fevers, joint pains, or permanent skin changes that would be concerning for a more serious cause of hives.  - We will get some labs to rule out serious causes of hives: complete blood count, tryptase level, alpha gal panel, ESR, and CRP. - We we will call you in 1 to 2 weeks for the results of the testing. - Chronic hives are often times a self limited process and will "burn themselves out" over 6-12 months, although this is not always the case.  - In the meantime, start suppressive dosing of antihistamines:   - Morning: Zyrtec (cetirizine) 10 to 20 mg plus Pepcid (famotidine) 20 mg - Evening:  Zyrtec (cetirizine)10 to 20 mg plus Pepcid (famotidine) 20 mg  - You can change this dosing at home, decreasing the dose as needed or increasing the dosing as needed.  - If you are not tolerating the medications or are tired of taking them every day, we can start treatment with a monthly injectable medication called Xolair.   2. Follow up in 1-2 months or earlier if needed.   Subjective:   Jasmin Butler is a 70 y.o. female presenting today for evaluation of  Chief Complaint  Patient presents with  . Allergy Testing    Jasmin Butler has a history of the following: Patient Active Problem List   Diagnosis Date Noted  . Hypothyroidism 11/26/2018  . Pernicious anemia 11/03/2017  . Dysphagia 01/12/2016  . Multiple thyroid nodules 03/17/2015  . Dyspnea 10/27/2014  . Pulmonary infiltrates 10/27/2014  . Thyroid nodule 10/27/2014  . Chest pain   . Hyperlipidemia 02/29/2008  . ANEMIA 02/29/2008  .  Depression 02/29/2008  . ARTHRITIS 02/29/2008    History obtained from: chart review and patient.  Jasmin Butler was referred by Halford Chessman     Jasmin Butler is a 70 y.o. female presenting for an evaluation of urticaria.  She started having hives in November 2021. She had hives on her arms and went to the ED. She then went to Urgent Care the following day. They attributed it to the booster shot, which she received 11 days before that. She is continuing to break out. This has been going on for three months now. 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 This was two years ago. She did have an alpha gal panel drawn in May 2020 that demonstrated an IgE to alpha gal of 3.26 and an IgE to beef of 0.77.   She does occasionally eat some bacon however. She estimates that she eats bacon once per week. She only eats two slices. She does sometimes eat a hamburger around once per week.   Dr. Darnell Level, her PCP, put her on some medications to help suppress the reactions. This has helped. She estimates that she gets 2-3 tick bites per season. She has not had a repeat alpha gal drawn at this point. She does need a new EpiPen.   She denies allergic rhinitis symptoms. She denies new exposures including animals. She has not changed her laundry detergent. She has not had any changes to her  hand soap or anything.     Otherwise, there is no history of other atopic diseases, including asthma, food allergies, drug allergies, environmental allergies, stinging insect allergies, eczema or contact dermatitis. There is no significant infectious history. Vaccinations are up to date.    Past Medical History: Patient Active Problem List   Diagnosis Date Noted  . Hypothyroidism 11/26/2018  . Pernicious anemia 11/03/2017  . Dysphagia 01/12/2016  . Multiple thyroid nodules 03/17/2015  . Dyspnea 10/27/2014  . Pulmonary infiltrates 10/27/2014  . Thyroid nodule 10/27/2014  . Chest pain   . Hyperlipidemia 02/29/2008   . ANEMIA 02/29/2008  . Depression 02/29/2008  . ARTHRITIS 02/29/2008    Medication List:  Allergies as of 11/15/2020      Reactions   Penicillins Rash   Did it involve swelling of the face/tongue/throat, SOB, or low BP? Yes Did it involve sudden or severe rash/hives, skin peeling, or any reaction on the inside of your mouth or nose? Unk Did you need to seek medical attention at a hospital or doctor's office? Yes When did it last happen? 30 years ago If all above answers are "NO", may proceed with cephalosporin use.      Medication List       Accurate as of November 15, 2020  2:54 PM. If you have any questions, ask your nurse or doctor.        acetaminophen 500 MG tablet Commonly known as: TYLENOL Take 500 mg by mouth every 6 (six) hours as needed.   aspirin 81 MG EC tablet Take 1 tablet (81 mg total) by mouth daily. Swallow whole.   busPIRone 10 MG tablet Commonly known as: BUSPAR Take 1 tablet (10 mg total) by mouth 2 (two) times daily.   carvedilol 3.125 MG tablet Commonly known as: COREG Take 1 tablet (3.125 mg total) by mouth 2 (two) times daily.   cetirizine 10 MG tablet Commonly known as: ZYRTEC Take 1 tablet (10 mg total) by mouth at bedtime.   cyanocobalamin 1000 MCG/ML injection Commonly known as: (VITAMIN B-12) Inject 1,000 mcg into the muscle every 30 (thirty) days.   EPINEPHrine 0.3 mg/0.3 mL Soaj injection Commonly known as: EpiPen 2-Pak Inject 0.3 mLs (0.3 mg total) into the muscle as needed for anaphylaxis.   famotidine 20 MG tablet Commonly known as: Pepcid Take 1 tablet (20 mg total) by mouth 2 (two) times daily.   hydrOXYzine 10 MG tablet Commonly known as: ATARAX/VISTARIL Take 1 tablet (10 mg total) by mouth 3 (three) times daily as needed for itching.   levothyroxine 75 MCG tablet Commonly known as: SYNTHROID TAKE 1 TABLET BY MOUTH EVERY DAY   nitroGLYCERIN 0.4 MG SL tablet Commonly known as: NITROSTAT Place 1 tablet (0.4 mg total)  under the tongue every 5 (five) minutes as needed for chest pain.   predniSONE 10 MG (21) Tbpk tablet Commonly known as: STERAPRED UNI-PAK 21 TAB As directed x 6 days   rosuvastatin 40 MG tablet Commonly known as: CRESTOR Take 1 tablet (40 mg total) by mouth daily.   triamcinolone 0.1 % Commonly known as: KENALOG Apply 1 application topically 2 (two) times daily.       Birth History: non-contributory  Developmental History: non-contributory  Past Surgical History: Past Surgical History:  Procedure Laterality Date  . ABDOMINAL HYSTERECTOMY  1985  . CARDIAC CATHETERIZATION  ~ 2005  . CATARACT EXTRACTION W/ INTRAOCULAR LENS  IMPLANT, BILATERAL Bilateral 2000's  . CHOLECYSTECTOMY OPEN  ~ 1990  . EYE SURGERY    .   TUBAL LIGATION  ~ 1983     Family History: Family History  Problem Relation Age of Onset  . Leukemia Father   . Cancer Father        leukemia  . Hypertension Mother   . Stroke Mother 52  . Arthritis Mother   . Cancer Mother   . Cancer Brother        4 different cancers  . Asthma Brother   . Birth defects Son      Social History: Semone lives at home with her son. 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  Her husband died around three years ago. She lives in a house that was built in 1986. There are wooden floors throughout the home. They have a heat pump for heating and cooling. There are dogs in the home for several years. There are chickens outside of the home. There are no dust mite coverings on the bedding. There is no tobacco exposure. She is retired.    Review of Systems  Constitutional: Negative.  Negative for chills, fever, malaise/fatigue and weight loss.  HENT: Negative.  Negative for congestion, ear discharge and ear pain.   Eyes: Negative for pain, discharge and redness.  Respiratory: Negative for cough, sputum production, shortness of breath and wheezing.   Cardiovascular: Negative.  Negative for chest pain and palpitations.   Gastrointestinal: Negative for abdominal pain, constipation, diarrhea, heartburn, nausea and vomiting.  Skin: Positive for itching and rash.  Neurological: Negative for dizziness and headaches.  Endo/Heme/Allergies: Negative for environmental allergies. Does not bruise/bleed easily.       Objective:   Blood pressure 136/66, pulse 60, temperature 98.1 F (36.7 C), temperature source Temporal, resp. rate 16, height 5' 3" (1.6 m), weight 143 lb 12.8 oz (65.2 kg), SpO2 96 %. Body mass index is 25.47 kg/m.   Physical Exam:   Physical Exam Constitutional:      Appearance: She is well-developed.  HENT:     Head: Normocephalic and atraumatic.     Right Ear: Tympanic membrane, ear canal and external ear normal. No drainage, swelling or tenderness. Tympanic membrane is not injected, scarred, erythematous, retracted or bulging.     Left Ear: Tympanic membrane, ear canal and external ear normal. No drainage, swelling or tenderness. Tympanic membrane is not injected, scarred, erythematous, retracted or bulging.     Nose: No nasal deformity, septal deviation, mucosal edema, rhinorrhea or epistaxis.     Right Turbinates: Enlarged and swollen.     Left Turbinates: Enlarged and swollen.     Right Sinus: No maxillary sinus tenderness or frontal sinus tenderness.     Left Sinus: No maxillary sinus tenderness or frontal sinus tenderness.     Mouth/Throat:     Mouth: Oropharynx is clear and moist. Mucous membranes are not pale and not dry.     Pharynx: Uvula midline.     Comments: Cobblestoning present in the posterior oropharynx.  Eyes:     General:        Right eye: No discharge.        Left eye: No discharge.     Extraocular Movements: EOM normal.     Conjunctiva/sclera: Conjunctivae normal.     Right eye: Right conjunctiva is not injected. No chemosis.    Left eye: Left conjunctiva is not injected. No chemosis.    Pupils: Pupils are equal, round, and reactive to light.  Cardiovascular:      Rate and Rhythm: Normal rate and regular rhythm.  Heart sounds: Normal heart sounds.  Pulmonary:     Effort: Pulmonary effort is normal. No tachypnea, accessory muscle usage or respiratory distress.     Breath sounds: Normal breath sounds. No wheezing, rhonchi or rales.     Comments: Moving air well in all lung fields. No increased work of breathing.  Chest:     Chest wall: No tenderness.  Abdominal:     Tenderness: There is no abdominal tenderness. There is no guarding or rebound.  Lymphadenopathy:     Head:     Right side of head: No submandibular, tonsillar or occipital adenopathy.     Left side of head: No submandibular, tonsillar or occipital adenopathy.     Cervical: No cervical adenopathy.  Skin:    General: Skin is warm.     Capillary Refill: Capillary refill takes less than 2 seconds.     Coloration: Skin is not pale.     Findings: No abrasion, erythema, petechiae or rash. Rash is not papular, urticarial or vesicular.     Comments: Some excoriations present on her bilateral arms.   Neurological:     Mental Status: She is alert.  Psychiatric:        Mood and Affect: Mood and affect normal.      Diagnostic studies:     Allergy Studies:     Airborne Adult Perc - 11/15/20 1417    Time Antigen Placed 1417    Allergen Manufacturer Greer    Location Back    Number of Test 59    1. Control-Buffer 50% Glycerol Negative    2. Control-Histamine 1 mg/ml 2+    3. Albumin saline Negative    4. [place] Negative    5. [place] Negative    6. Johnson Negative    7. [place] Blue Negative    8. Meadow Fescue Negative    9. Perennial Rye Negative    10. Sweet Vernal Negative    11. Timothy Negative    12. Cocklebur 2+    13. Burweed Marshelder 2+    14. Ragweed, short Negative    15. Ragweed, Giant 2+    16. Plantain,  English Negative    17. Lamb's Quarters Negative    18. Sheep Sorrell Negative    19. Rough Pigweed Negative    20. Marsh Elder, Rough Negative     21. Mugwort, Common Negative    22. Ash mix 2+    23. Birch mix 2+    24. Beech American Negative    25. Box, Elder 2+    26. Cedar, red Negative    27. Cottonwood, Eastern Negative    28. Elm mix Negative    29. Hickory Negative    30. Maple mix Negative    31. Oak, Eastern mix Negative    32. Pecan Pollen Negative    33. Pine mix Negative    34. Sycamore Eastern Negative    35. Walnut, Black Pollen Negative    36. Alternaria alternata Negative    37. Cladosporium Herbarum Negative    38. Aspergillus mix Negative    39. Penicillium mix Negative    40. Bipolaris sorokiniana (Helminthosporium) Negative    41. Drechslera spicifera (Curvularia) Negative    42. Mucor plumbeus Negative    43. Fusarium moniliforme Negative    44. Aureobasidium pullulans (pullulara) Negative    45. Rhizopus oryzae Negative    46. Botrytis cinera Negative    47. Epicoccum nigrum 2+    48.   Phoma betae Negative    49. Candida Albicans Negative    50. Trichophyton mentagrophytes Negative    51. Mite, D Farinae  5,000 AU/ml Negative    52. Mite, D Pteronyssinus  5,000 AU/ml Negative    53. Cat Hair 10,000 BAU/ml Negative    54.  Dog Epithelia 2+    55. Mixed Feathers Negative    56. Horse Epithelia Negative    57. Cockroach, German Negative    58. Mouse Negative          Food Perc - 11/15/20 1449      Test Information   Time Antigen Placed 1430    Allergen Manufacturer Greer    Location Back    Number of allergen test 10    Food Select      Food   1. Peanut Negative    2. Soybean food Negative    3. Wheat, whole Negative    4. Sesame Negative    5. Milk, cow Negative    6. Egg White, chicken Negative    7. Casein Negative    8. Shellfish mix Negative    9. Fish mix Negative    10. Cashew Negative           Allergy testing results were read and interpreted by myself, documented by clinical staff.         Joel Gallagher, MD [facility]      

## 2020-11-15 NOTE — Patient Instructions (Addendum)
1. Chronic urticaria - Testing was positive only to weeds, trees, one mold, and dog. - None of these explain your symptoms. - Copy of testing results provided. - Your history does not have any "red flags" such as fevers, joint pains, or permanent skin changes that would be concerning for a more serious cause of hives.  - We will get some labs to rule out serious causes of hives: complete blood count, tryptase level, alpha gal panel, ESR, and CRP. - We we will call you in 1 to 2 weeks for the results of the testing. - Chronic hives are often times a self limited process and will "burn themselves out" over 6-12 months, although this is not always the case.  - In the meantime, start suppressive dosing of antihistamines:   - Morning: Zyrtec (cetirizine) 10 to 20 mg plus Pepcid (famotidine) 20 mg - Evening:  Zyrtec (cetirizine)10 to 20 mg plus Pepcid (famotidine) 20 mg  - You can change this dosing at home, decreasing the dose as needed or increasing the dosing as needed.  - If you are not tolerating the medications or are tired of taking them every day, we can start treatment with a monthly injectable medication called Xolair.   2. No follow-ups on file.   Please inform us of any Emergency Department visits, hospitalizations, or changes in symptoms. Call us before going to the ED for breathing or allergy symptoms since we might be able to fit you in for a sick visit. Feel free to contact us anytime with any questions, problems, or concerns.  It was a pleasure to meet you today!  Websites that have reliable patient information: 1. American Academy of Asthma, Allergy, and Immunology: www.aaaai.org 2. Food Allergy Research and Education (FARE): foodallergy.org 3. Mothers of Asthmatics: http://www.asthmacommunitynetwork.org 4. American College of Allergy, Asthma, and Immunology: www.acaai.org   COVID-19 Vaccine Information can be found at:  [email or web address] For questions related to vaccine distribution or appointments, please email [email or web address] or call [phone].     "Like" [place] on Facebook and Instagram for our latest updates!       Make sure you are registered to vote! If you have moved or changed any of your contact information, you will need to get this updated before voting!  In some cases, you MAY be able to register to vote online: [email or web address]    Reducing Pollen Exposure  The American Academy of Allergy, Asthma and Immunology suggests the following steps to reduce your exposure to pollen during allergy seasons.    1. Do not hang sheets or clothing out to dry; pollen may collect on these items. 2. Do not mow lawns or spend time around freshly had grass; mowing stirs up pollen. 3. Keep windows closed at night.  Keep car windows closed while driving. 4. Minimize morning activities outdoors, a time when pollen counts are usually at their highest. 5. Stay indoors as much as possible when pollen counts or humidity is high and on windy days when pollen tends to remain in the air longer. 6. Use air conditioning when possible.  Many air conditioners have filters that trap the pollen spores. 7. Use a HEPA room air filter to remove pollen form the indoor air you breathe.  Control of Mold Allergen   Mold and fungi can grow on a variety of surfaces provided certain temperature and moisture conditions exist.  Outdoor molds grow on plants, decaying vegetation and soil.  The major outdoor mold, Alternaria and Cladosporium, are found in very high  numbers during hot and dry conditions.  Generally, a late Summer - Fall peak is seen for common outdoor fungal spores.  Rain will temporarily lower outdoor mold spore count, but counts rise rapidly when the rainy period ends.  The most important indoor molds are Aspergillus and  Penicillium.  Dark, humid and poorly ventilated basements are ideal sites for mold growth.  The next most common sites of mold growth are the bathroom and the kitchen.  Outdoor (Seasonal) Mold Control  Positive outdoor molds via skin testing: Epicoccum  1. Use air conditioning and keep windows closed 2. Avoid exposure to decaying vegetation. 3. Avoid leaf raking. 4. Avoid grain handling. 5. Consider wearing a face mask if working in moldy areas.   Control of Dog or Cat Allergen  Avoidance is the best way to manage a dog or cat allergy. If you have a dog or cat and are allergic to dog or cats, consider removing the dog or cat from the home. If you have a dog or cat but don't want to find it a new home, or if your family wants a pet even though someone in the household is allergic, here are some strategies that may help keep symptoms at bay:  1. Keep the pet out of your bedroom and restrict it to only a few rooms. Be advised that keeping the dog or cat in only one room will not limit the allergens to that room. 2. Don't pet, hug or kiss the dog or cat; if you do, wash your hands with soap and water. 3. High-efficiency particulate air (HEPA) cleaners run continuously in a bedroom or living room can reduce allergen levels over time. 4. Regular use of a high-efficiency vacuum cleaner or a central vacuum can reduce allergen levels. 5. Giving your dog or cat a bath at least once a week can reduce airborne allergen.   Airborne Adult Perc - 11/15/20 1417    Time Antigen Placed 1417    Allergen Manufacturer Lavella Hammock    Location Back    Number of Test 59    1. Control-Buffer 50% Glycerol Negative    2. Control-Histamine 1 mg/ml 2+    3. Albumin saline Negative    4. [place] Negative    5. [place] Negative    6. Johnson Negative    7. [place] Blue Negative    8. Meadow Fescue Negative    9. Perennial Rye Negative    10. Sweet Vernal Negative    11. Timothy Negative    12. Cocklebur 2+    13.  Burweed Marshelder 2+    14. Ragweed, short Negative    15. Ragweed, Giant 2+    16. Plantain,  English Negative    17. Lamb's Quarters Negative    18. Sheep Sorrell Negative    19. Rough Pigweed Negative    20. Marsh Elder, Rough Negative    21. Mugwort, Common Negative    22. Ash mix 2+    23. Birch mix 2+    24. Beech American Negative    25. Box, Elder 2+    26. Cedar, red Negative    27. Cottonwood, [place] Negative    28. Elm mix Negative    29. Hickory Negative    30. Maple mix Negative    31. Oak, [place] mix Negative    32. Pecan Pollen Negative    33. Pine mix Negative    34. Sycamore Eastern Negative    35. Walnut, Black Pollen Negative  36. Alternaria alternata Negative    37. Cladosporium Herbarum Negative    38. Aspergillus mix Negative    39. Penicillium mix Negative    40. Bipolaris sorokiniana (Helminthosporium) Negative    41. Drechslera spicifera (Curvularia) Negative    42. Mucor plumbeus Negative    43. Fusarium moniliforme Negative    44. Aureobasidium pullulans (pullulara) Negative    45. Rhizopus oryzae Negative    46. Botrytis cinera Negative    47. Epicoccum nigrum 2+    48. Phoma betae Negative    49. Candida Albicans Negative    50. Trichophyton mentagrophytes Negative    51. Mite, D Farinae  5,000 AU/ml Negative    52. Mite, D Pteronyssinus  5,000 AU/ml Negative    53. Cat Hair 10,000 BAU/ml Negative    54.  Dog Epithelia 2+    55. Mixed Feathers Negative    56. Horse Epithelia Negative    57. Cockroach, German Negative    58. Mouse Negative          Food Perc - 11/15/20 1449      Test Information   Time Antigen Placed 1430    Allergen Manufacturer Lavella Hammock    Location Back    Number of allergen test 10    Food Select      Food   1. Peanut Negative    2. Soybean food Negative    3. Wheat, whole Negative    4. Sesame Negative    5. Milk, cow Negative    6. Egg White, chicken Negative    7. Casein Negative    8. Shellfish mix  Negative    9. Fish mix Negative    10. Cashew Negative

## 2020-11-16 ENCOUNTER — Ambulatory Visit (INDEPENDENT_AMBULATORY_CARE_PROVIDER_SITE_OTHER): Payer: Medicare HMO | Admitting: *Deleted

## 2020-11-16 DIAGNOSIS — E538 Deficiency of other specified B group vitamins: Secondary | ICD-10-CM | POA: Diagnosis not present

## 2020-11-16 NOTE — Progress Notes (Signed)
PATIENT IN TODAY FOR MONTHLY B12 INJECTION. 1000 MCG GIVEN IM IN LEFT DELTOID. PATIENT TOLERATED WELL.  

## 2020-11-18 LAB — ANTINUCLEAR ANTIBODIES, IFA: ANA Titer 1: NEGATIVE

## 2020-11-18 LAB — CMP14+EGFR
ALT: 18 IU/L (ref 0–32)
AST: 25 IU/L (ref 0–40)
Albumin/Globulin Ratio: 2.3 — ABNORMAL HIGH (ref 1.2–2.2)
Albumin: 4.6 g/dL (ref 3.7–4.7)
Alkaline Phosphatase: 70 IU/L (ref 44–121)
BUN/Creatinine Ratio: 13 (ref 12–28)
BUN: 9 mg/dL (ref 8–27)
Bilirubin Total: <0.2 mg/dL (ref 0.0–1.2)
CO2: 28 mmol/L (ref 20–29)
Calcium: 9.2 mg/dL (ref 8.7–10.3)
Chloride: 102 mmol/L (ref 96–106)
Creatinine, Ser: 0.71 mg/dL (ref 0.57–1.00)
GFR calc Af Amer: 99 mL/min/{1.73_m2} (ref >59)
GFR calc non Af Amer: 86 mL/min/{1.73_m2} (ref >59)
Globulin, Total: 2 g/dL (ref 1.5–4.5)
Glucose: 132 mg/dL — ABNORMAL HIGH (ref 65–99)
Potassium: 3.9 mmol/L (ref 3.5–5.2)
Sodium: 141 mmol/L (ref 134–144)
Total Protein: 6.6 g/dL (ref 6.0–8.5)

## 2020-11-18 LAB — TRYPTASE: Tryptase: 18.9 ug/L — ABNORMAL HIGH (ref 2.2–13.2)

## 2020-11-18 LAB — SEDIMENTATION RATE: Sed Rate: 2 mm/hr (ref 0–40)

## 2020-11-18 LAB — C-REACTIVE PROTEIN: CRP: <1 mg/L (ref 0–10)

## 2020-11-19 LAB — ALPHA-GAL PANEL
Allergen Lamb IgE: <0.1 kU/L
Beef IgE: 0.17 kU/L — AB
IgE (Immunoglobulin E), Serum: 24 IU/mL (ref 6–495)
O215-IgE Alpha-Gal: 0.87 kU/L — AB
Pork IgE: <0.1 kU/L

## 2020-11-21 NOTE — Addendum Note (Signed)
Addended by: Valentina Shaggy on: 11/21/2020 10:37 AM   Modules accepted: Orders

## 2020-11-22 ENCOUNTER — Other Ambulatory Visit: Payer: Self-pay

## 2020-11-22 ENCOUNTER — Other Ambulatory Visit (INDEPENDENT_AMBULATORY_CARE_PROVIDER_SITE_OTHER): Payer: Medicare HMO

## 2020-11-22 DIAGNOSIS — R748 Abnormal levels of other serum enzymes: Secondary | ICD-10-CM | POA: Diagnosis not present

## 2020-11-24 LAB — TRYPTASE: Tryptase: 19.4 ug/L — ABNORMAL HIGH (ref 2.2–13.2)

## 2020-11-27 ENCOUNTER — Encounter: Payer: Self-pay | Admitting: Allergy & Immunology

## 2020-11-27 ENCOUNTER — Encounter: Payer: Self-pay | Admitting: Family Medicine

## 2020-11-28 ENCOUNTER — Encounter: Payer: Self-pay | Admitting: Allergy & Immunology

## 2020-11-28 ENCOUNTER — Ambulatory Visit (INDEPENDENT_AMBULATORY_CARE_PROVIDER_SITE_OTHER): Payer: Medicare HMO | Admitting: Allergy & Immunology

## 2020-11-28 ENCOUNTER — Other Ambulatory Visit: Payer: Self-pay

## 2020-11-28 DIAGNOSIS — R748 Abnormal levels of other serum enzymes: Secondary | ICD-10-CM | POA: Diagnosis not present

## 2020-11-28 NOTE — Progress Notes (Signed)
Patient placed on the schedule now for a telehealth visit with Dr Ernst Bowler.

## 2020-11-28 NOTE — Patient Instructions (Addendum)
1. We are sending a test to look for mast cell genetic mutation (c-KIT), which is present in mast cell proliferation disorders.  2. I would also like to send a cheek swab genetic test to look for hereditary alpha tryptasemia, but we are out of these testing kits.  We will call you when we get these in the mail.  3. Follow up as scheduled.

## 2020-11-28 NOTE — Progress Notes (Signed)
RE: Jasmin Butler MRN: [id number] DOB: [birth year] Date of Telemedicine Visit: 11/28/2020  Referring provider: [name], DO Primary care provider: [name], DO  Chief Complaint: No chief complaint on file.   Telemedicine Follow Up Visit via Telephone: I connected with Jasmin Butler for a follow up on 11/28/20 by telephone and verified that I am speaking with the correct person using two identifiers.   I discussed the limitations, risks, security and privacy concerns of performing an evaluation and management service by telephone and the availability of in person appointments. I also discussed with the patient that there may be a patient responsible charge related to this service. The patient expressed understanding and agreed to proceed.  Patient is in her car on her way to the pet store.  Provider is at the office.  Visit start time: 11:15 AM Visit end time: 11:35 AM Insurance consent/check in by: [facility] consent and medical assistant/nurse: Dee  History of Present Illness:  She is a 70 y.o. female, who is being followed for chronic urticaria. Her previous allergy office visit was in February 2022 with myself.  At that visit, she had environmental testing that was positive only to weeds, trees, mold, and dog.  We started her on Zyrtec 10 to 20 mg as well as Pepcid 20 mg twice daily.  We obtained labs that showed an elevated tryptase of 18.9.  A repeat was 19.4.  She presents today to discuss the implications of that.  She also had an alpha gal panel that came back mildly elevated to alpha gal as well as beef at 0.87 and 0.17, respectively.  We recommended avoiding red meat to see how she did.  Since last visit, she has been rather anxious.  She remains on her antihistamines.  She has had no diarrhea, vomiting, or throat swelling.  She has had no systemic symptoms whatsoever.  Her only clinical manifestation was the rash.  Overall, her symptoms are slightly better  than before.  Jasmin Butler does have a history of pernicious anemia.  She has been getting monthly B12 injections for the past 30 years.  Her labs are essentially normal now.  She is wondering if there is a connection between this and the hives and elevated tryptase.  There is a family history of cancer. Brother has had four different types of cancer (stomach, throat, kidney x 2). Father, grandfather, and aunt passed away from leukemia. Dad passed away at age 36.  She is unsure of the type of cancer he had exactly.  Otherwise, there have been no changes to her past medical history, surgical history, family history, or social history.  Assessment and Plan:  Jasmin Butler is a 70 y.o. female with:  Chronic urticaria  Elevated tryptase x 2 - sending c-kit mutation analysis on the blood   We are going to get a c-kit analysis on her blood.  I would also like to send a buccal swab for hereditary alpha trip to see Mia before we go down the hematology/oncology referral for a bone marrow biopsy.  I think this might be a little bit aggressive if it ends up being hereditary also trip to see me a.  I did reach out to the company ([employer]) to see how much it would be with Medicare.  I am awaiting to get a response for that, but I think I am going to go ahead and order a test kit.   Diagnostics: None.  Medication List:  Current  Outpatient Medications  Medication Sig Dispense Refill  . acetaminophen (TYLENOL) 500 MG tablet Take 500 mg by mouth every 6 (six) hours as needed.    Marland Kitchen aspirin EC 81 MG EC tablet Take 1 tablet (81 mg total) by mouth daily. Swallow whole. (Patient not taking: Reported on 08/19/2020) 30 tablet 0  . busPIRone (BUSPAR) 10 MG tablet Take 1 tablet (10 mg total) by mouth 2 (two) times daily. 180 tablet 1  . carvedilol (COREG) 3.125 MG tablet Take 1 tablet (3.125 mg total) by mouth 2 (two) times daily. (Patient not taking: Reported on 08/19/2020) 180 tablet 3  . cetirizine (ZYRTEC) 10 MG tablet  Take 1 tablet (10 mg total) by mouth at bedtime. 30 tablet 11  . cyanocobalamin (,VITAMIN B-12,) 1000 MCG/ML injection Inject 1,000 mcg into the muscle every 30 (thirty) days.     Marland Kitchen EPINEPHrine (EPIPEN 2-PAK) 0.3 mg/0.3 mL IJ SOAJ injection Inject 0.3 mg into the muscle as needed for anaphylaxis. 1 each 2  . famotidine (PEPCID) 20 MG tablet Take 1 tablet (20 mg total) by mouth 2 (two) times daily. 60 tablet 3  . hydrOXYzine (ATARAX/VISTARIL) 10 MG tablet Take 1 tablet (10 mg total) by mouth 3 (three) times daily as needed for itching. 30 tablet 3  . levothyroxine (SYNTHROID) 75 MCG tablet TAKE 1 TABLET BY MOUTH EVERY DAY 90 tablet 3  . nitroGLYCERIN (NITROSTAT) 0.4 MG SL tablet Place 1 tablet (0.4 mg total) under the tongue every 5 (five) minutes as needed for chest pain. 30 tablet 0  . predniSONE (STERAPRED UNI-PAK 21 TAB) 10 MG (21) TBPK tablet As directed x 6 days 21 tablet 0  . rosuvastatin (CRESTOR) 40 MG tablet Take 1 tablet (40 mg total) by mouth daily. 90 tablet 3  . triamcinolone cream (KENALOG) 0.1 % Apply 1 application topically 2 (two) times daily. 60 g 0   Current Facility-Administered Medications  Medication Dose Route Frequency Provider Last Rate Last Admin  . cyanocobalamin ((VITAMIN B-12)) injection 1,000 mcg  1,000 mcg Intramuscular Q30 days Ronnie Doss M, DO   1,000 mcg at 11/16/20 6599   Allergies: Allergies  Allergen Reactions  . Penicillins Rash    Did it involve swelling of the face/tongue/throat, SOB, or low BP? Yes Did it involve sudden or severe rash/hives, skin peeling, or any reaction on the inside of your mouth or nose? Unk Did you need to seek medical attention at a hospital or doctor's office? Yes When did it last happen? 30 years ago If all above answers are "NO", may proceed with cephalosporin use.    I reviewed her past medical history, social history, family history, and environmental history and no significant changes have been reported from  previous visits.  Review of Systems  Constitutional: Negative for activity change, appetite change, chills and diaphoresis.  HENT: Negative for congestion, postnasal drip, rhinorrhea, sinus pressure and sore throat.   Eyes: Negative for pain, discharge, redness and itching.  Respiratory: Negative for shortness of breath, wheezing and stridor.   Gastrointestinal: Negative for diarrhea, nausea and vomiting.  Musculoskeletal: Negative for arthralgias, joint swelling and myalgias.  Skin: Positive for rash.       Positive for pruritus.  Allergic/Immunologic: Negative for environmental allergies and food allergies.    Objective:  Physical exam not obtained as encounter was done via telephone.   Previous notes and tests were reviewed.  I discussed the assessment and treatment plan with the patient. The patient was provided an opportunity to ask  questions and all were answered. The patient agreed with the plan and demonstrated an understanding of the instructions.   The patient was advised to call back or seek an in-person evaluation if the symptoms worsen or if the condition fails to improve as anticipated.  I provided 20 minutes of non-face-to-face time during this encounter.  It was my pleasure to participate in [place] care today. Please feel free to contact me with any questions or concerns.   Sincerely,  Valentina Shaggy, MD

## 2020-12-07 LAB — C-KIT MUTATION, LIQUID TUMOR

## 2020-12-08 ENCOUNTER — Encounter: Payer: Self-pay | Admitting: Allergy & Immunology

## 2020-12-09 ENCOUNTER — Encounter: Payer: Self-pay | Admitting: Allergy & Immunology

## 2020-12-11 NOTE — Telephone Encounter (Signed)
Patient called today inquiring about the Genetic Test(Mouth Swab)  Dr Ernst Bowler requested she have. She is wondering will her [facility] cover the testing? She is also wondering if it doesn't can she set up a payment plan with the company?    Dr Ernst Bowler do you know what test she is referring to?   Thanks

## 2020-12-12 ENCOUNTER — Encounter: Payer: Self-pay | Admitting: Family Medicine

## 2020-12-14 ENCOUNTER — Other Ambulatory Visit: Payer: Self-pay

## 2020-12-14 ENCOUNTER — Ambulatory Visit (INDEPENDENT_AMBULATORY_CARE_PROVIDER_SITE_OTHER): Payer: Medicare HMO

## 2020-12-14 DIAGNOSIS — E538 Deficiency of other specified B group vitamins: Secondary | ICD-10-CM

## 2020-12-14 NOTE — Progress Notes (Signed)
Cyanocobalamin injection given to right deltoid.  Patient tolerated well. 

## 2020-12-15 ENCOUNTER — Ambulatory Visit: Payer: Medicare HMO | Admitting: Allergy & Immunology

## 2020-12-26 ENCOUNTER — Encounter: Payer: Self-pay | Admitting: Allergy & Immunology

## 2020-12-26 ENCOUNTER — Other Ambulatory Visit: Payer: Self-pay

## 2020-12-26 ENCOUNTER — Ambulatory Visit: Payer: Medicare HMO | Admitting: Allergy & Immunology

## 2020-12-26 VITALS — BP 138/80 | HR 62 | Temp 98.1°F | Resp 16 | Ht 63.0 in | Wt 146.0 lb

## 2020-12-26 DIAGNOSIS — R748 Abnormal levels of other serum enzymes: Secondary | ICD-10-CM | POA: Diagnosis not present

## 2020-12-26 DIAGNOSIS — L509 Urticaria, unspecified: Secondary | ICD-10-CM | POA: Diagnosis not present

## 2020-12-26 MED ORDER — CETIRIZINE HCL 10 MG PO TABS: 10.0000 mg | ORAL_TABLET | Freq: Every day | ORAL | 11 refills | Status: DC

## 2020-12-26 MED ORDER — CETIRIZINE HCL 10 MG PO TABS: 10.0000 mg | ORAL_TABLET | Freq: Every day | ORAL | 5 refills | Status: DC

## 2020-12-26 NOTE — Progress Notes (Signed)
FOLLOW UP  Date of Service/Encounter:  12/26/20   Assessment:   Chronic urticaria  Elevated tryptase x 2 - with negative c-kit (sending HAT genetic testing)   Jasmin Butler seems to have fairly good control of her symptoms.  She is not using any topical medications.  She does have hydroxyzine to use as needed.  She continues to have outbreaks, although they are less severe than when we first started seeing her. Clearly the avoidance of red meat has helped somewhat.  She is not avoiding dairy, but she is very open to this.  We are going to try this for a month to see if it helps.  She has had no episodes of anaphylaxis but she does have an intact epinephrine autoinjector.  If the genetic testing for hereditary alpha tryptassemia is normal today, we will send her to hematology oncology for an evaluation of elevated tryptase.  Plan/Recommendations:    1. Elevated serum tryptase - We are going to send the hereditary alpha tryptassemia genetic testing. - Most insurance does NOT cover the testing, but you can call them to check for sure. - This testing can take 2-3 weeks to come back. - Information on HAT provided. - Add on cetirizine $RemoveBefor'10mg'gpwJwaClJGqH$  daily to keep reactions under control. - Continue to avoid mammalian meat. - Try avoiding dairy too in order to see if this helps at all. - EpiPen is up to date.  2. Return in about 6 months (around 06/28/2021).   Subjective:   Jasmin Butler is a 70 y.o. female presenting today for follow up of  Chief Complaint  Patient presents with  . Urticaria    Not as bad but still has break outs    Jasmin Butler has a history of the following: Patient Active Problem List   Diagnosis Date Noted  . Hypothyroidism 11/26/2018  . Pernicious anemia 11/03/2017  . Dysphagia 01/12/2016  . Multiple thyroid nodules 03/17/2015  . Dyspnea 10/27/2014  . Pulmonary infiltrates 10/27/2014  . Thyroid nodule 10/27/2014  . Chest pain   . Hyperlipidemia 02/29/2008  .  ANEMIA 02/29/2008  . Depression 02/29/2008  . ARTHRITIS 02/29/2008    History obtained from: chart review and patient.  Jasmin Butler is a 70 y.o. female presenting for a follow up visit.  She was last seen in February 2022.  At that time, we talked over her lab results over the phone.  We decided to send a c-kit mutation to see if she had any mast cell genetic rearrangements.  We also talked about doing genetic testing to look for HAT.  We felt this would save her the pain and time required for a bone marrow biopsy if we had sent her to hematology oncology instead.  We continued her on her antihistamines daily  She has been having the rash nearly daily. It is not everywhere now and then will break out intermittently. She takes hydroxyzine as needed.  She is not taking the Zyrtec every day.  She tells me that her insurance would not cover it and was from a charge her $30 a month for cetirizine twice a day.  It is unclear whether they were trying to give her brand-name Zyrtec or what, but she is open to me sending in another prescription to try to get it covered better.  She does have an EpiPen in place. She is avoiding all mammalian meats. She does not drink milk much at all, sometimes with her cereal. She does not like cheese or  yogurt.  She is open to avoiding it and see if it helps more.  She definitely feels better today than when I first saw her  Otherwise, there have been no changes to her past.  Medical history, surgical history, family history, or social history.    Review of Systems  Constitutional: Negative.  Negative for chills, fever, malaise/fatigue and weight loss.  HENT: Negative.  Negative for congestion, ear discharge, ear pain, sinus pain and sore throat.   Eyes: Negative for pain, discharge and redness.  Respiratory: Negative for cough, sputum production, shortness of breath and wheezing.   Cardiovascular: Negative.  Negative for chest pain and palpitations.  Gastrointestinal:  Negative for abdominal pain, constipation, diarrhea, heartburn, nausea and vomiting.  Skin: Positive for itching and rash.  Neurological: Negative for dizziness and headaches.  Endo/Heme/Allergies: Negative for environmental allergies. Does not bruise/bleed easily.       Objective:   Blood pressure 138/80, pulse 62, temperature 98.1 F (36.7 C), temperature source Temporal, resp. rate 16, height 5\' 3"  (1.6 m), weight 146 lb (66.2 kg), SpO2 95 %. Body mass index is 25.86 kg/m.   Physical Exam:  Physical Exam Constitutional:      Appearance: She is well-developed.  HENT:     Head: Normocephalic and atraumatic.     Right Ear: Tympanic membrane, ear canal and external ear normal.     Left Ear: Tympanic membrane, ear canal and external ear normal.     Nose: No nasal deformity, septal deviation, mucosal edema or rhinorrhea.     Right Turbinates: Enlarged and swollen.     Left Turbinates: Enlarged and swollen.     Right Sinus: No maxillary sinus tenderness or frontal sinus tenderness.     Left Sinus: No maxillary sinus tenderness or frontal sinus tenderness.     Mouth/Throat:     Mouth: Mucous membranes are not pale and not dry.     Pharynx: Uvula midline.  Eyes:     General:        Right eye: No discharge.        Left eye: No discharge.     Conjunctiva/sclera: Conjunctivae normal.     Right eye: Right conjunctiva is not injected. No chemosis.    Left eye: Left conjunctiva is not injected. No chemosis.    Pupils: Pupils are equal, round, and reactive to light.  Cardiovascular:     Rate and Rhythm: Normal rate and regular rhythm.     Heart sounds: Normal heart sounds.  Pulmonary:     Effort: Pulmonary effort is normal. No tachypnea, accessory muscle usage or respiratory distress.     Breath sounds: Normal breath sounds. No wheezing, rhonchi or rales.     Comments: Moving air well in all lung fields.  Chest:     Chest wall: No tenderness.  Lymphadenopathy:     Cervical: No  cervical adenopathy.  Skin:    General: Skin is warm.     Capillary Refill: Capillary refill takes less than 2 seconds.     Coloration: Skin is not pale.     Findings: No abrasion, erythema, petechiae or rash. Rash is not papular, urticarial or vesicular.     Comments: There are some episodes of erythema.   Neurological:     Mental Status: She is alert.  Psychiatric:        Behavior: Behavior is cooperative.      Diagnostic studies: buccal swab collected     Salvatore Marvel, MD  [facility]

## 2020-12-26 NOTE — Patient Instructions (Addendum)
1. Elevated serum tryptase - We are going to send the hereditary alpha tryptassemia genetic testing. - Most insurance does NOT cover the testing, but you can call them to check for sure. - This testing can take 2-3 weeks to come back. - Information on HAT provided. - Add on cetirizine 10mg  daily to keep reactions under control. - Continue to avoid mammalian meat. - Try avoiding dairy too in order to see if this helps at all. - EpiPen is up to date.  2. Return in about 6 months (around 06/28/2021).    Please inform us of any Emergency Department visits, hospitalizations, or changes in symptoms. Call us before going to the ED for breathing or allergy symptoms since we might be able to fit you in for a sick visit. Feel free to contact us anytime with any questions, problems, or concerns.  It was a pleasure to see you again today!  Websites that have reliable patient information: 1. American Academy of Asthma, Allergy, and Immunology: www.aaaai.org 2. Food Allergy Research and Education (FARE): foodallergy.org 3. Mothers of Asthmatics: http://www.asthmacommunitynetwork.org 4. American College of Allergy, Asthma, and Immunology: www.acaai.org   COVID-19 Vaccine Information can be found at: ShippingScam.co.uk For questions related to vaccine distribution or appointments, please email vaccine@[email or web address] [email or web address] or call 4458704015.   We realize that you might be concerned about having an allergic reaction to the COVID19 vaccines. To help with that concern, WE ARE OFFERING THE COVID19 VACCINES IN OUR OFFICE! Ask the front desk for dates!     "Like" [place] on Facebook and Instagram for our latest updates!      A healthy democracy works best when [employer] participate! Make sure you are registered to vote! If you have moved or changed any of your contact information, you will need to get this updated before voting!  In some cases, you MAY  be able to register to vote online: [email or web address]

## 2020-12-27 DIAGNOSIS — H43813 Vitreous degeneration, bilateral: Secondary | ICD-10-CM | POA: Diagnosis not present

## 2020-12-27 DIAGNOSIS — H43391 Other vitreous opacities, right eye: Secondary | ICD-10-CM | POA: Diagnosis not present

## 2020-12-27 DIAGNOSIS — H31091 Other chorioretinal scars, right eye: Secondary | ICD-10-CM | POA: Diagnosis not present

## 2021-01-08 ENCOUNTER — Encounter: Payer: Self-pay | Admitting: Allergy & Immunology

## 2021-01-12 ENCOUNTER — Other Ambulatory Visit: Payer: Self-pay

## 2021-01-12 ENCOUNTER — Ambulatory Visit (INDEPENDENT_AMBULATORY_CARE_PROVIDER_SITE_OTHER): Payer: Medicare HMO | Admitting: Family Medicine

## 2021-01-12 DIAGNOSIS — E538 Deficiency of other specified B group vitamins: Secondary | ICD-10-CM | POA: Diagnosis not present

## 2021-01-15 ENCOUNTER — Ambulatory Visit: Payer: Medicare HMO

## 2021-01-24 ENCOUNTER — Telehealth: Payer: Self-pay | Admitting: Allergy & Immunology

## 2021-01-24 NOTE — Telephone Encounter (Signed)
I called the patient to discuss her lab results. She is positive for hereditary alpha tryptassemia.   She has removed cow's milk from her diet and this has helped to decrease her reactions. She did get the results and would like some more information on the diagnosis. I promised to email her some information.   I recommended taking Zyrtec (cetirizine) 10mg  once daily and stopping the Pepcid and famotidine.   Salvatore Marvel, MD [facility]

## 2021-02-05 ENCOUNTER — Encounter: Payer: Self-pay | Admitting: Family Medicine

## 2021-02-08 ENCOUNTER — Ambulatory Visit (INDEPENDENT_AMBULATORY_CARE_PROVIDER_SITE_OTHER): Payer: Medicare HMO | Admitting: Nurse Practitioner

## 2021-02-08 ENCOUNTER — Encounter: Payer: Self-pay | Admitting: Nurse Practitioner

## 2021-02-08 ENCOUNTER — Other Ambulatory Visit: Payer: Self-pay

## 2021-02-08 VITALS — BP 151/80 | HR 58 | Temp 98.4°F | Resp 20 | Ht 63.0 in | Wt 145.0 lb

## 2021-02-08 DIAGNOSIS — D510 Vitamin B12 deficiency anemia due to intrinsic factor deficiency: Secondary | ICD-10-CM

## 2021-02-08 DIAGNOSIS — E034 Atrophy of thyroid (acquired): Secondary | ICD-10-CM | POA: Diagnosis not present

## 2021-02-08 DIAGNOSIS — I10 Essential (primary) hypertension: Secondary | ICD-10-CM | POA: Diagnosis not present

## 2021-02-08 DIAGNOSIS — Z91018 Allergy to other foods: Secondary | ICD-10-CM | POA: Diagnosis not present

## 2021-02-08 DIAGNOSIS — E782 Mixed hyperlipidemia: Secondary | ICD-10-CM

## 2021-02-08 NOTE — Progress Notes (Signed)
Subjective:    Patient ID: Jasmin Butler, female    DOB: [birth year], 70 y.o.   MRN: [id number]   Chief Complaint: Rash   HPI Patient come sin today c/o breaking out in rash. She has history of alpha gal for 2 years. She got covid booster Oct 2021 and broke out after that and had to be treated with steroids. Now every now and then she is breaking out in whelps. She has seen Dr. Alinda Deem. Was given hydroxyzine and pepcid which helps when she has a break out. She has seen allergist and has elevated alpha- tryptase. She is mainly hear just to have blood work rechecked.    HPI:  1. Hypothyroidism due to acquired atrophy of thyroid She is on synthroid 105mcg dialy. No problems that she is aware of. Lab Results  Component Value Date   TSH 0.390 (L) 10/25/2020     2. Mixed hyperlipidemia Does try to watch diet. She says she stays very active Lab Results  Component Value Date   CHOL 172 10/25/2020   HDL 58 10/25/2020   LDLCALC 84 10/25/2020   LDLDIRECT 101 (H) 05/29/2018   TRIG 177 (H) 10/25/2020   CHOLHDL 3.0 10/25/2020     3. Pernicious anemia No c/o fatigue. Gets monthly b12 injections Lab Results  Component Value Date   [id number] 831 01/19/2020     4. Allergy to alpha-gal Wants labs repeated  BP Readings from Last 3 Encounters:  02/08/21 (!) 151/80  12/26/20 138/80  11/15/20 136/66     Outpatient Encounter Medications as of 02/08/2021  Medication Sig  . busPIRone (BUSPAR) 10 MG tablet Take 1 tablet (10 mg total) by mouth 2 (two) times daily.  . cyanocobalamin (,VITAMIN B-12,) 1000 MCG/ML injection Inject 1,000 mcg into the muscle every 30 (thirty) days.   Marland Kitchen EPINEPHrine (EPIPEN 2-PAK) 0.3 mg/0.3 mL IJ SOAJ injection Inject 0.3 mg into the muscle as needed for anaphylaxis.  . famotidine (PEPCID) 20 MG tablet Take 1 tablet (20 mg total) by mouth 2 (two) times daily.  . hydrOXYzine (ATARAX/VISTARIL) 10 MG tablet Take 1 tablet (10 mg total) by mouth 3 (three)  times daily as needed for itching.  . levothyroxine (SYNTHROID) 75 MCG tablet TAKE 1 TABLET BY MOUTH EVERY DAY  . predniSONE (STERAPRED UNI-PAK 21 TAB) 10 MG (21) TBPK tablet As directed x 6 days  . [DISCONTINUED] aspirin EC 81 MG EC tablet Take 1 tablet (81 mg total) by mouth daily. Swallow whole.  . rosuvastatin (CRESTOR) 40 MG tablet Take 1 tablet (40 mg total) by mouth daily.  . [DISCONTINUED] nitroGLYCERIN (NITROSTAT) 0.4 MG SL tablet Place 1 tablet (0.4 mg total) under the tongue every 5 (five) minutes as needed for chest pain. (Patient not taking: Reported on 12/26/2020)   Facility-Administered Encounter Medications as of 02/08/2021  Medication  . cyanocobalamin ((VITAMIN B-12)) injection 1,000 mcg    Past Surgical History:  Procedure Laterality Date  . ABDOMINAL HYSTERECTOMY  1985  . CARDIAC CATHETERIZATION  ~ 2005  . CATARACT EXTRACTION W/ INTRAOCULAR LENS  IMPLANT, BILATERAL Bilateral 2000's  . CHOLECYSTECTOMY OPEN  ~ 1990  . EYE SURGERY    . TUBAL LIGATION  ~ 1983    Family History  Problem Relation Age of Onset  . Leukemia Father   . Cancer Father        leukemia  . Hypertension Mother   . Stroke Mother 38  . Arthritis Mother   . Cancer Mother   . Cancer  Brother        4 different cancers  . Asthma Brother   . Birth defects Son     New complaints: None other then stated above  Social history: Lives by herself. Family check on her daily  Controlled substance contract: n/a     Review of Systems  Constitutional: Negative for diaphoresis.  Eyes: Negative for pain.  Respiratory: Negative for shortness of breath.   Cardiovascular: Negative for chest pain, palpitations and leg swelling.  Gastrointestinal: Negative for abdominal pain.  Endocrine: Negative for polydipsia.  Skin: Negative for rash.  Neurological: Negative for dizziness, weakness and headaches.  Hematological: Does not bruise/bleed easily.  All other systems reviewed and are negative.       Objective:   Physical Exam Vitals and nursing note reviewed.  Constitutional:      General: She is not in acute distress.    Appearance: Normal appearance. She is well-developed.  HENT:     Head: Normocephalic.     Nose: Nose normal.  Eyes:     Pupils: Pupils are equal, round, and reactive to light.  Neck:     Vascular: No carotid bruit or JVD.  Cardiovascular:     Rate and Rhythm: Normal rate and regular rhythm.     Heart sounds: Normal heart sounds.  Pulmonary:     Effort: Pulmonary effort is normal. No respiratory distress.     Breath sounds: Normal breath sounds. No wheezing or rales.  Chest:     Chest wall: No tenderness.  Abdominal:     General: Bowel sounds are normal. There is no distension or abdominal bruit.     Palpations: Abdomen is soft. There is no hepatomegaly, splenomegaly, mass or pulsatile mass.     Tenderness: There is no abdominal tenderness.  Musculoskeletal:        General: Normal range of motion.     Cervical back: Normal range of motion and neck supple.  Lymphadenopathy:     Cervical: No cervical adenopathy.  Skin:    General: Skin is warm and dry.  Neurological:     Mental Status: She is alert and oriented to person, place, and time.     Deep Tendon Reflexes: Reflexes are normal and symmetric.  Psychiatric:        Behavior: Behavior normal.        Thought Content: Thought content normal.        Judgment: Judgment normal.     BP (!) 151/80   Pulse (!) 58   Temp 98.4 F (36.9 C) (Temporal)   Resp 20   Ht $R'5\' 3"'fl$  (1.6 m)   Wt 145 lb (65.8 kg)   SpO2 98%   BMI 25.69 kg/m        Assessment & Plan:  Jasmin Butler comes in today with chief complaint of Rash   Diagnosis and orders addressed:  1. Hypothyroidism due to acquired atrophy of thyroid Labs pending - Thyroid Panel With TSH  2. Mixed hyperlipidemia Low fat diet  - CBC with Differential/Platelet - CMP14+EGFR - Lipid panel  3. Pernicious anemia Labs pending Continue  monthly b12 injections - Vitamin B12  4. Allergy to alpha-gal Avoid red meat and pork Keep food diary - alpha gal levels  5. Primary hypertension Keep check of blood pressure at home   Labs pending Health Maintenance reviewed Diet and exercise encouraged  Follow up plan: 3 months with PCP    Sarayah-Margaret Hassell Done, FNP

## 2021-02-08 NOTE — Patient Instructions (Signed)

## 2021-02-12 ENCOUNTER — Other Ambulatory Visit: Payer: Self-pay

## 2021-02-12 ENCOUNTER — Ambulatory Visit (INDEPENDENT_AMBULATORY_CARE_PROVIDER_SITE_OTHER): Payer: Medicare HMO | Admitting: Family Medicine

## 2021-02-12 DIAGNOSIS — E538 Deficiency of other specified B group vitamins: Secondary | ICD-10-CM | POA: Diagnosis not present

## 2021-02-15 LAB — CMP14+EGFR
ALT: 11 IU/L (ref 0–32)
AST: 19 IU/L (ref 0–40)
Albumin/Globulin Ratio: 2.3 — ABNORMAL HIGH (ref 1.2–2.2)
Albumin: 4.5 g/dL (ref 3.7–4.7)
Alkaline Phosphatase: 56 IU/L (ref 44–121)
BUN/Creatinine Ratio: 19 (ref 12–28)
BUN: 13 mg/dL (ref 8–27)
Bilirubin Total: 0.3 mg/dL (ref 0.0–1.2)
CO2: 25 mmol/L (ref 20–29)
Calcium: 9 mg/dL (ref 8.7–10.3)
Chloride: 100 mmol/L (ref 96–106)
Creatinine, Ser: 0.68 mg/dL (ref 0.57–1.00)
Globulin, Total: 2 g/dL (ref 1.5–4.5)
Glucose: 84 mg/dL (ref 65–99)
Potassium: 4 mmol/L (ref 3.5–5.2)
Sodium: 142 mmol/L (ref 134–144)
Total Protein: 6.5 g/dL (ref 6.0–8.5)
eGFR: 93 mL/min/{1.73_m2} (ref >59)

## 2021-02-15 LAB — CBC WITH DIFFERENTIAL/PLATELET
Basophils Absolute: 0.1 10*3/uL (ref 0.0–0.2)
Basos: 1 %
EOS (ABSOLUTE): 0.1 10*3/uL (ref 0.0–0.4)
Eos: 1 %
Hematocrit: 38.4 % (ref 34.0–46.6)
Hemoglobin: 12.3 g/dL (ref 11.1–15.9)
Immature Grans (Abs): 0 10*3/uL (ref 0.0–0.1)
Immature Granulocytes: 0 %
Lymphocytes Absolute: 4.6 10*3/uL — ABNORMAL HIGH (ref 0.7–3.1)
Lymphs: 51 %
MCH: 27.4 pg (ref 26.6–33.0)
MCHC: 32 g/dL (ref 31.5–35.7)
MCV: 86 fL (ref 79–97)
Monocytes Absolute: 0.7 10*3/uL (ref 0.1–0.9)
Monocytes: 7 %
Neutrophils Absolute: 3.7 10*3/uL (ref 1.4–7.0)
Neutrophils: 40 %
Platelets: 243 10*3/uL (ref 150–450)
RBC: 4.49 x10E6/uL (ref 3.77–5.28)
RDW: 13.5 % (ref 11.7–15.4)
WBC: 9.3 10*3/uL (ref 3.4–10.8)

## 2021-02-15 LAB — ALPHA-GAL PANEL
Allergen Lamb IgE: <0.1 kU/L
Beef IgE: 0.15 kU/L — AB
IgE (Immunoglobulin E), Serum: 24 IU/mL (ref 6–495)
O215-IgE Alpha-Gal: 0.63 kU/L — AB
Pork IgE: <0.1 kU/L

## 2021-02-15 LAB — LIPID PANEL
Chol/HDL Ratio: 2.6 ratio (ref 0.0–4.4)
Cholesterol, Total: 162 mg/dL (ref 100–199)
HDL: 63 mg/dL (ref >39)
LDL Chol Calc (NIH): 62 mg/dL (ref 0–99)
Triglycerides: 233 mg/dL — ABNORMAL HIGH (ref 0–149)
VLDL Cholesterol Cal: 37 mg/dL (ref 5–40)

## 2021-02-15 LAB — THYROID PANEL WITH TSH
Free Thyroxine Index: 2.4 (ref 1.2–4.9)
T3 Uptake Ratio: 31 % (ref 24–39)
T4, Total: 7.7 ug/dL (ref 4.5–12.0)
TSH: 1.29 u[IU]/mL (ref 0.450–4.500)

## 2021-02-15 LAB — VITAMIN B12: Vitamin B-12: 470 pg/mL (ref 232–1245)

## 2021-02-16 ENCOUNTER — Encounter: Payer: Self-pay | Admitting: Family Medicine

## 2021-02-16 ENCOUNTER — Other Ambulatory Visit: Payer: Self-pay | Admitting: *Deleted

## 2021-02-16 DIAGNOSIS — L509 Urticaria, unspecified: Secondary | ICD-10-CM

## 2021-02-16 MED ORDER — FAMOTIDINE 20 MG PO TABS: 20.0000 mg | ORAL_TABLET | Freq: Two times a day (BID) | ORAL | 3 refills | Status: DC

## 2021-03-15 ENCOUNTER — Other Ambulatory Visit: Payer: Self-pay

## 2021-03-15 ENCOUNTER — Ambulatory Visit (INDEPENDENT_AMBULATORY_CARE_PROVIDER_SITE_OTHER): Payer: Medicare HMO

## 2021-03-15 DIAGNOSIS — E538 Deficiency of other specified B group vitamins: Secondary | ICD-10-CM | POA: Diagnosis not present

## 2021-03-15 NOTE — Progress Notes (Signed)
Cyanocobalamin injection given to left deltoid.  Patient tolerated well. 

## 2021-04-13 ENCOUNTER — Ambulatory Visit (INDEPENDENT_AMBULATORY_CARE_PROVIDER_SITE_OTHER): Payer: Medicare HMO

## 2021-04-13 ENCOUNTER — Other Ambulatory Visit: Payer: Self-pay

## 2021-04-13 DIAGNOSIS — E538 Deficiency of other specified B group vitamins: Secondary | ICD-10-CM

## 2021-04-13 NOTE — Progress Notes (Signed)
B12 injection given to patient and tolerated well.  

## 2021-05-02 ENCOUNTER — Ambulatory Visit: Payer: Self-pay | Admitting: Allergy & Immunology

## 2021-05-14 ENCOUNTER — Ambulatory Visit (INDEPENDENT_AMBULATORY_CARE_PROVIDER_SITE_OTHER): Payer: Medicare HMO | Admitting: Family Medicine

## 2021-05-14 ENCOUNTER — Other Ambulatory Visit: Payer: Self-pay

## 2021-05-14 ENCOUNTER — Other Ambulatory Visit: Payer: Self-pay | Admitting: Family Medicine

## 2021-05-14 ENCOUNTER — Encounter: Payer: Self-pay | Admitting: Family Medicine

## 2021-05-14 VITALS — BP 147/76 | HR 58 | Temp 97.2°F | Ht 63.0 in | Wt 144.0 lb

## 2021-05-14 DIAGNOSIS — M25561 Pain in right knee: Secondary | ICD-10-CM | POA: Diagnosis not present

## 2021-05-14 DIAGNOSIS — I10 Essential (primary) hypertension: Secondary | ICD-10-CM | POA: Diagnosis not present

## 2021-05-14 DIAGNOSIS — D510 Vitamin B12 deficiency anemia due to intrinsic factor deficiency: Secondary | ICD-10-CM | POA: Diagnosis not present

## 2021-05-14 DIAGNOSIS — E782 Mixed hyperlipidemia: Secondary | ICD-10-CM

## 2021-05-14 DIAGNOSIS — M7122 Synovial cyst of popliteal space [Baker], left knee: Secondary | ICD-10-CM

## 2021-05-14 DIAGNOSIS — T466X5A Adverse effect of antihyperlipidemic and antiarteriosclerotic drugs, initial encounter: Secondary | ICD-10-CM | POA: Diagnosis not present

## 2021-05-14 DIAGNOSIS — L509 Urticaria, unspecified: Secondary | ICD-10-CM

## 2021-05-14 DIAGNOSIS — M25562 Pain in left knee: Secondary | ICD-10-CM

## 2021-05-14 DIAGNOSIS — E034 Atrophy of thyroid (acquired): Secondary | ICD-10-CM

## 2021-05-14 DIAGNOSIS — E538 Deficiency of other specified B group vitamins: Secondary | ICD-10-CM | POA: Diagnosis not present

## 2021-05-14 DIAGNOSIS — M791 Myalgia, unspecified site: Secondary | ICD-10-CM | POA: Diagnosis not present

## 2021-05-14 MED ORDER — PITAVASTATIN CALCIUM 4 MG PO TABS: 4.0000 mg | ORAL_TABLET | Freq: Every day | ORAL | 12 refills | Status: DC

## 2021-05-14 MED ORDER — METHYLPREDNISOLONE ACETATE 40 MG/ML IJ SUSP
40.0000 mg | Freq: Once | INTRAMUSCULAR | Status: AC
  Administered 2021-05-14: 40 mg via INTRAMUSCULAR

## 2021-05-14 NOTE — Patient Instructions (Signed)
Fasting labs in 3 months.  Orders are in. No appt needed.  Livalo to replace the Crestor.  Sometimes this requires prior authorization with insurance.  You got a shot of steroid to help with the knee pain.  Use a brace on the left.  Ok to continue ice. Home physical therapy exercises provided.

## 2021-05-14 NOTE — Telephone Encounter (Signed)
Has been intolerant to Lipitor, Crestor. Pravastatin insufficient.  Zocor with too many drug interactions.

## 2021-05-14 NOTE — Progress Notes (Signed)
Subjective: CC: HTN w/ HLD, Hypothyroidism PCP: [name], DO [id number] Jasmin Butler is a 70 y.o. female presenting to clinic today for:  1. HTN w. HLD Compliant with her Crestor but admits that this does cause some joint pain and muscle pain.  No chest pain, shortness of breath, change in exercise tolerance with the exception of her knee pain see below.  2. Hypothyroidisim Compliant with Synthroid.  No reports of change in voice, tremor or difficulty swallowing  3. Pernicious anemia/ B12 deficiency Last B12 injection 04/13/21.  She is due today for repeat injection.  Denies any sensory changes, balance changes.  Overall energy has been stable  4.  Knee pain Patient reported bilateral knee pain but her left knee started having some swelling and had a posterior fullness.  She has had history of Baker's cyst before which resolved without drainage.  She has been using a knee brace but it is old so she thinks she may need to replace it.  She has been using heat and ice and an oral OTC NSAID.  The swelling is slightly better today but she still has some aching along the medial knee on the left.  Reports some sensation of instability in that knee.   ROS: Per HPI  Allergies  Allergen Reactions   Penicillins Rash    Did it involve swelling of the face/tongue/throat, SOB, or low BP? Yes Did it involve sudden or severe rash/hives, skin peeling, or any reaction on the inside of your mouth or nose? Unk Did you need to seek medical attention at a hospital or doctor's office? Yes When did it last happen? 30 years ago If all above answers are "NO", may proceed with cephalosporin use.    Past Medical History:  Diagnosis Date   Allergy to alpha-gal    Anxiety    Cataract    Depression    "couple times/yr" (09/29/2014)   GERD (gastroesophageal reflux disease) 2005   Headache    "weekly" (09/29/2014)   History of hiatal hernia    Hyperlipidemia    IBS (irritable bowel syndrome)     Migraine    "2-3 times/yr" (09/29/2014)   Pernicious anemia    Pneumonia 1980's   "double"   Thyroid disease    Urticaria     Current Outpatient Medications:    busPIRone (BUSPAR) 10 MG tablet, Take 1 tablet (10 mg total) by mouth 2 (two) times daily., Disp: 180 tablet, Rfl: 1   cyanocobalamin (,VITAMIN B-12,) 1000 MCG/ML injection, Inject 1,000 mcg into the muscle every 30 (thirty) days. , Disp: , Rfl:    EPINEPHrine (EPIPEN 2-PAK) 0.3 mg/0.3 mL IJ SOAJ injection, Inject 0.3 mg into the muscle as needed for anaphylaxis., Disp: 1 each, Rfl: 2   famotidine (PEPCID) 20 MG tablet, Take 1 tablet (20 mg total) by mouth 2 (two) times daily., Disp: 60 tablet, Rfl: 3   hydrOXYzine (ATARAX/VISTARIL) 10 MG tablet, Take 1 tablet (10 mg total) by mouth 3 (three) times daily as needed for itching., Disp: 30 tablet, Rfl: 3   levothyroxine (SYNTHROID) 75 MCG tablet, TAKE 1 TABLET BY MOUTH EVERY DAY, Disp: 90 tablet, Rfl: 3   rosuvastatin (CRESTOR) 40 MG tablet, Take 1 tablet (40 mg total) by mouth daily., Disp: 90 tablet, Rfl: 3  Current Facility-Administered Medications:    cyanocobalamin ((VITAMIN B-12)) injection 1,000 mcg, 1,000 mcg, Intramuscular, Q30 days, Ronnie Doss M, DO, 1,000 mcg at 04/13/21 [id number] Social History   Socioeconomic History   Marital  status: Widowed    Spouse name: Not on file   Number of children: 3   Years of education: GED   Highest education level: GED or equivalent  Occupational History   Occupation: Retired- Engineer, materials  Tobacco Use   Smoking status: Never   Smokeless tobacco: Never  Vaping Use   Vaping Use: Never used  Substance and Sexual Activity   Alcohol use: No   Drug use: No   Sexual activity: Not Currently  Other Topics Concern   Not on file  Social History Narrative   Not on file   Social Determinants of Health   Financial Resource Strain: Not on file  Food Insecurity: Not on file  Transportation Needs: Not on file  Physical Activity: Not  on file  Stress: Not on file  Social Connections: Not on file  Intimate Partner Violence: Not on file   Family History  Problem Relation Age of Onset   Leukemia Father    Cancer Father        leukemia   Hypertension Mother    Stroke Mother 39   Arthritis Mother    Cancer Mother    Cancer Brother        4 different cancers   Asthma Brother    Birth defects Son     Objective: Office vital signs reviewed. BP (!) 147/76   Pulse (!) 58   Temp (!) 97.2 F (36.2 C)   Ht '5\' 3"'$  (1.6 m)   Wt 144 lb (65.3 kg)   SpO2 99%   BMI 25.51 kg/m   Physical Examination:  General: Awake, alert, well nourished, No acute distress HEENT: Normal; sclera white.  No exophthalmos Cardio: Mildly bradycardic on exam with regular rhythm, S1S2 heard, no murmurs appreciated Pulm: clear to auscultation bilaterally, no wheezes, rhonchi or rales; normal work of breathing on room air Extremities: warm, well perfused, No edema, cyanosis or clubbing; +2 pulses bilaterally MSK: Normal gait and normal station  Left knee: No gross joint effusion, soft tissue swelling, erythema or warmth.  No tenderness palpation to the patella, patellar tendon, quad tendon, joint line.  She does have a small fullness along the medial posterior popliteal fossa that is nontender.  No ligamentous laxity in the knee.  Right knee: No gross joint effusion.  No soft tissue swelling, erythema or warmth.  No tenderness to palpation to patella, patellar tendon, quad tendon or joint line.  No palpable posterior popliteal masses.  No ligamentous laxity Skin: dry; intact; no rashes or lesions Neuro: Light touch and station grossly intact  Assessment/ Plan: 70 y.o. female   Acute pain of both knees - Plan: methylPREDNISolone acetate (DEPO-MEDROL) injection 40 mg  Baker cyst, left - Plan: methylPREDNISolone acetate (DEPO-MEDROL) injection 40 mg  Hypothyroidism due to acquired atrophy of thyroid - Plan: TSH, T4, free  Vitamin B12  deficiency  Pernicious anemia  Mixed hyperlipidemia - Plan: Pitavastatin Calcium 4 MG TABS, Lipid panel, Hepatic function panel  Myalgia due to statin  Primary hypertension  Knee pain on the left is likely meniscal in nature.  There was a palpable Baker's cyst on the left knee.  We discussed compression.  She was given an intramuscular corticosteroid injection today.  Continue ice as needed.  Home physical therapy exercises provided.  She will follow-up as needed on that issue  Asymptomatic from a thyroid standpoint.  Plan for TSH, free T4, hepatic function panel and lipid panel in the next 3 months.  She will schedule  Asymptomatic from a pernicious anemia, B12 standpoint.  Not yet due for B12 check but B12 injection was administered today  Having some difficulty with myalgia due to statin.  She has failed Lipitor, pravastatin and now Crestor.  We will try Livalo.  Discussed that this is brand-name and her insurance may or may not cover.  Blood pressure is well controlled.  No changes needed  No orders of the defined types were placed in this encounter.  No orders of the defined types were placed in this encounter.    [name], DO [place] [phone]

## 2021-05-28 ENCOUNTER — Other Ambulatory Visit: Payer: Self-pay | Admitting: Family Medicine

## 2021-05-30 ENCOUNTER — Other Ambulatory Visit: Payer: Self-pay | Admitting: Medical

## 2021-06-15 ENCOUNTER — Ambulatory Visit (INDEPENDENT_AMBULATORY_CARE_PROVIDER_SITE_OTHER): Payer: Medicare HMO | Admitting: *Deleted

## 2021-06-15 ENCOUNTER — Other Ambulatory Visit: Payer: Self-pay

## 2021-06-15 DIAGNOSIS — E538 Deficiency of other specified B group vitamins: Secondary | ICD-10-CM | POA: Diagnosis not present

## 2021-06-22 ENCOUNTER — Other Ambulatory Visit: Payer: Self-pay | Admitting: Medical

## 2021-06-25 ENCOUNTER — Other Ambulatory Visit: Payer: Self-pay | Admitting: Family Medicine

## 2021-07-16 ENCOUNTER — Other Ambulatory Visit: Payer: Self-pay

## 2021-07-16 ENCOUNTER — Ambulatory Visit (INDEPENDENT_AMBULATORY_CARE_PROVIDER_SITE_OTHER): Payer: Medicare HMO

## 2021-07-16 DIAGNOSIS — E538 Deficiency of other specified B group vitamins: Secondary | ICD-10-CM

## 2021-07-16 DIAGNOSIS — Z23 Encounter for immunization: Secondary | ICD-10-CM | POA: Diagnosis not present

## 2021-07-16 NOTE — Progress Notes (Signed)
Cyanocobalamin injection given to left deltoid.  Patient tolerated well. 

## 2021-08-01 ENCOUNTER — Other Ambulatory Visit: Payer: Self-pay

## 2021-08-01 ENCOUNTER — Other Ambulatory Visit: Payer: Medicare HMO

## 2021-08-01 DIAGNOSIS — E034 Atrophy of thyroid (acquired): Secondary | ICD-10-CM

## 2021-08-01 DIAGNOSIS — E782 Mixed hyperlipidemia: Secondary | ICD-10-CM | POA: Diagnosis not present

## 2021-08-02 ENCOUNTER — Encounter: Payer: Self-pay | Admitting: Family Medicine

## 2021-08-02 LAB — HEPATIC FUNCTION PANEL
ALT: 11 IU/L (ref 0–32)
AST: 20 IU/L (ref 0–40)
Albumin: 4.6 g/dL (ref 3.7–4.7)
Alkaline Phosphatase: 71 IU/L (ref 44–121)
Bilirubin Total: 0.3 mg/dL (ref 0.0–1.2)
Bilirubin, Direct: <0.1 mg/dL (ref 0.00–0.40)
Total Protein: 6.8 g/dL (ref 6.0–8.5)

## 2021-08-02 LAB — LIPID PANEL
Chol/HDL Ratio: 5.5 ratio — ABNORMAL HIGH (ref 0.0–4.4)
Cholesterol, Total: 291 mg/dL — ABNORMAL HIGH (ref 100–199)
HDL: 53 mg/dL (ref >39)
LDL Chol Calc (NIH): 204 mg/dL — ABNORMAL HIGH (ref 0–99)
Triglycerides: 178 mg/dL — ABNORMAL HIGH (ref 0–149)
VLDL Cholesterol Cal: 34 mg/dL (ref 5–40)

## 2021-08-02 LAB — T4, FREE: Free T4: 1.33 ng/dL (ref 0.82–1.77)

## 2021-08-02 LAB — TSH: TSH: 1.23 u[IU]/mL (ref 0.450–4.500)

## 2021-08-15 ENCOUNTER — Other Ambulatory Visit: Payer: Self-pay

## 2021-08-15 ENCOUNTER — Ambulatory Visit (INDEPENDENT_AMBULATORY_CARE_PROVIDER_SITE_OTHER): Payer: Medicare HMO | Admitting: *Deleted

## 2021-08-15 DIAGNOSIS — E538 Deficiency of other specified B group vitamins: Secondary | ICD-10-CM | POA: Diagnosis not present

## 2021-08-16 ENCOUNTER — Ambulatory Visit: Payer: Medicare HMO

## 2021-08-28 ENCOUNTER — Encounter: Payer: Self-pay | Admitting: Family Medicine

## 2021-08-31 ENCOUNTER — Other Ambulatory Visit: Payer: Self-pay

## 2021-08-31 ENCOUNTER — Encounter: Payer: Self-pay | Admitting: Family Medicine

## 2021-08-31 ENCOUNTER — Ambulatory Visit (INDEPENDENT_AMBULATORY_CARE_PROVIDER_SITE_OTHER): Payer: Medicare HMO | Admitting: Family Medicine

## 2021-08-31 ENCOUNTER — Ambulatory Visit (INDEPENDENT_AMBULATORY_CARE_PROVIDER_SITE_OTHER): Payer: Medicare HMO

## 2021-08-31 VITALS — Ht 63.0 in | Wt 140.0 lb

## 2021-08-31 VITALS — BP 133/86 | HR 66 | Temp 96.9°F | Ht 63.0 in | Wt 140.0 lb

## 2021-08-31 DIAGNOSIS — R69 Illness, unspecified: Secondary | ICD-10-CM | POA: Diagnosis not present

## 2021-08-31 DIAGNOSIS — G479 Sleep disorder, unspecified: Secondary | ICD-10-CM

## 2021-08-31 DIAGNOSIS — Z0000 Encounter for general adult medical examination without abnormal findings: Secondary | ICD-10-CM | POA: Diagnosis not present

## 2021-08-31 DIAGNOSIS — F411 Generalized anxiety disorder: Secondary | ICD-10-CM | POA: Diagnosis not present

## 2021-08-31 MED ORDER — MIRTAZAPINE 7.5 MG PO TABS: 7.5000 mg | ORAL_TABLET | Freq: Every day | ORAL | 1 refills | Status: DC

## 2021-08-31 MED ORDER — HYDROXYZINE HCL 25 MG PO TABS: 12.5000 mg | ORAL_TABLET | Freq: Three times a day (TID) | ORAL | 0 refills | Status: DC | PRN

## 2021-08-31 NOTE — Progress Notes (Signed)
Subjective: CC: Anxiety disorder PCP: [name], DO MEQ:ASTM Jasmin Butler is a 70 y.o. female presenting to clinic today for:  1.  Anxiety disorder Patient reports that she had exacerbation anxiety disorder recently when she had a birthday party.  The kids overwhelmed her and she started panicking.  She had a similar response when she went to a car dealership for purchasing a vehicle.  Sometimes even going into [employer] overwhelms her.  She is currently taking 5 mg of BuSpar twice daily, as the 10 mg was causing her anxiety to be worse.  She has Atarax on hand but was not aware that this could be utilized for anxiety.  She denies any excessive daytime sedation or complications when she has used Atarax in the past for itching.  She is not utilize any other medications for anxiety disorder but would be interested in pursuing something else.   ROS: Per HPI  Allergies  Allergen Reactions   Penicillins Rash    Did it involve swelling of the face/tongue/throat, SOB, or low BP? Yes Did it involve sudden or severe rash/hives, skin peeling, or any reaction on the inside of your mouth or nose? Unk Did you need to seek medical attention at a hospital or doctor's office? Yes When did it last happen? 30 years ago If all above answers are "NO", may proceed with cephalosporin use.    Past Medical History:  Diagnosis Date   Allergy to alpha-gal    Anxiety    Cataract    Depression    "couple times/yr" (09/29/2014)   GERD (gastroesophageal reflux disease) 2005   Headache    "weekly" (09/29/2014)   History of hiatal hernia    Hyperlipidemia    IBS (irritable bowel syndrome)    Migraine    "2-3 times/yr" (09/29/2014)   Pernicious anemia    Pneumonia 1980's   "double"   Thyroid disease    Urticaria     Current Outpatient Medications:    busPIRone (BUSPAR) 10 MG tablet, Take 1 tablet (10 mg total) by mouth 2 (two) times daily., Disp: 180 tablet, Rfl: 1   cyanocobalamin (,VITAMIN  B-12,) 1000 MCG/ML injection, Inject 1,000 mcg into the muscle every 30 (thirty) days. , Disp: , Rfl:    EPINEPHrine (EPIPEN 2-PAK) 0.3 mg/0.3 mL IJ SOAJ injection, Inject 0.3 mg into the muscle as needed for anaphylaxis., Disp: 1 each, Rfl: 2   famotidine (PEPCID) 20 MG tablet, TAKE 1 TABLET BY MOUTH TWICE A DAY, Disp: 180 tablet, Rfl: 1   hydrOXYzine (ATARAX/VISTARIL) 10 MG tablet, Take 1 tablet (10 mg total) by mouth 3 (three) times daily as needed for itching., Disp: 30 tablet, Rfl: 3   levothyroxine (SYNTHROID) 75 MCG tablet, TAKE 1 TABLET (75 MCG TOTAL) BY MOUTH DAILY. (NEEDS LABWORK), Disp: 30 tablet, Rfl: 0   rosuvastatin (CRESTOR) 40 MG tablet, TAKE 1 TABLET BY MOUTH EVERY DAY, Disp: 90 tablet, Rfl: 3  Current Facility-Administered Medications:    cyanocobalamin ((VITAMIN B-12)) injection 1,000 mcg, 1,000 mcg, Intramuscular, Q30 days, Melisia Leming M, DO, 1,000 mcg at 08/15/21 1551 Social History   Socioeconomic History   Marital status: Widowed    Spouse name: Not on file   Number of children: 3   Years of education: GED   Highest education level: GED or equivalent  Occupational History   Occupation: Retired- Engineer, materials  Tobacco Use   Smoking status: Never   Smokeless tobacco: Never  Vaping Use   Vaping Use: Never used  Substance and Sexual Activity   Alcohol use: No   Drug use: No   Sexual activity: Not Currently  Other Topics Concern   Not on file  Social History Narrative   Not on file   Social Determinants of Health   Financial Resource Strain: Not on file  Food Insecurity: Not on file  Transportation Needs: Not on file  Physical Activity: Not on file  Stress: Not on file  Social Connections: Not on file  Intimate Partner Violence: Not on file   Family History  Problem Relation Age of Onset   Leukemia Father    Cancer Father        leukemia   Hypertension Mother    Stroke Mother 65   Arthritis Mother    Cancer Mother    Cancer Brother        4  different cancers   Asthma Brother    Birth defects Son     Objective: Office vital signs reviewed. BP 133/86   Pulse 66   Temp (!) 96.9 F (36.1 C) (Temporal)   Ht 5\' 3"  (1.6 m)   Wt 140 lb (63.5 kg)   SpO2 97%   BMI 24.80 kg/m   Physical Examination:  General: Awake, alert, well nourished, No acute distress HEENT: No exophthalmos.  No goiter Cardio: regular rate and rhythm, S1S2 heard, no murmurs appreciated Pulm: clear to auscultation bilaterally, no wheezes, rhonchi or rales; normal work of breathing on room air Psych: Mood stable, speech normal, affect appropriate.  Patient is pleasant, interactive.  Depression screen [facility] 2/9 08/31/2021 05/14/2021 02/08/2021  Decreased Interest 0 0 0  Down, Depressed, Hopeless 0 0 0  PHQ - 2 Score 0 0 0  Altered sleeping 1 - -  Tired, decreased energy 0 - -  Change in appetite 0 - -  Feeling bad or failure about yourself  0 - -  Trouble concentrating 0 - -  Moving slowly or fidgety/restless 0 - -  Suicidal thoughts 0 - -  PHQ-9 Score 1 - -  Difficult doing work/chores Not difficult at all - -   GAD 7 : Generalized Anxiety Score 08/31/2021 05/14/2021 04/24/2020  Nervous, Anxious, on Edge 1 0 0  Control/stop worrying 0 0 0  Worry too much - different things 1 0 0  Trouble relaxing 0 0 0  Restless 0 0 0  Easily annoyed or irritable 1 0 0  Afraid - awful might happen 0 0 0  Total GAD 7 Score 3 0 0  Anxiety Difficulty Not difficult at all Not difficult at all -     Assessment/ Plan: 70 y.o. female   Generalized anxiety disorder - Plan: mirtazapine (REMERON) 7.5 MG tablet, hydrOXYzine (ATARAX/VISTARIL) 25 MG tablet  Sleep disorder - Plan: mirtazapine (REMERON) 7.5 MG tablet  Anxiety disorder is not well controlled buspirone.  Do not believe this is mediated by her thyroid as her labs were normal in October.  May discontinue BuSpar.  Trial of Remeron.  We discussed that she may briefly use Atarax if needed for panic disorder while  she is acclimated to the mirtazapine.  Caution sedation and dryness.  Follow-up in 4 to 6 weeks, sooner if needed  No orders of the defined types were placed in this encounter.  Meds ordered this encounter  Medications   mirtazapine (REMERON) 7.5 MG tablet    Sig: Take 1 tablet (7.5 mg total) by mouth at bedtime.    Dispense:  90 tablet  Refill:  1   hydrOXYzine (ATARAX/VISTARIL) 25 MG tablet    Sig: Take 0.5-1 tablets (12.5-25 mg total) by mouth every 8 (eight) hours as needed for anxiety.    Dispense:  30 tablet    Refill:  0   Medications Discontinued During This Encounter  Medication Reason   Pitavastatin Calcium 4 MG TABS    hydrOXYzine (ATARAX/VISTARIL) 10 MG tablet    busPIRone (BUSPAR) 10 MG tablet Ineffective     Noorah Giammona Windell Moulding, DO [place] [phone]

## 2021-08-31 NOTE — Progress Notes (Signed)
Subjective:   Jasmin Butler is a 70 y.o. female who presents for Medicare Annual (Subsequent) preventive examination.  Virtual Visit via Telephone Note  I connected with  Jasmin Butler on 08/31/21 at 10:30 AM EST by telephone and verified that I am speaking with the correct person using two identifiers.  Location: Patient: Home Provider: [facility] Persons participating in the virtual visit: patient/Nurse Health Advisor   I discussed the limitations, risks, security and privacy concerns of performing an evaluation and management service by telephone and the availability of in person appointments. The patient expressed understanding and agreed to proceed.  Interactive audio and video telecommunications were attempted between this nurse and patient, however failed, due to patient having technical difficulties OR patient did not have access to video capability.  We continued and completed visit with audio only.  Some vital signs may be absent or patient reported.   Brevan Luberto E Krishna Dancel, LPN   Review of Systems     Cardiac Risk Factors include: advanced age (>32men, >19 women);dyslipidemia;Other (see comment), Risk factor comments: pernicious anemia     Objective:    Today's Vitals   08/31/21 1033  Weight: 140 lb (63.5 kg)  Height: 5\' 3"  (1.6 m)   Body mass index is 24.8 kg/m.  Advanced Directives 08/31/2021 08/30/2020 08/19/2020 06/07/2020 06/23/2019 11/29/2018 09/29/2014  Does Patient Have a Medical Advance Directive? No No No No No No No  Would patient like information on creating a medical advance directive? No - Patient declined No - Patient declined - No - Patient declined No - Patient declined - No - patient declined information    Current Medications (verified) Outpatient Encounter Medications as of 08/31/2021  Medication Sig   cyanocobalamin (,VITAMIN B-12,) 1000 MCG/ML injection Inject 1,000 mcg into the muscle every 30 (thirty) days.    EPINEPHrine (EPIPEN 2-PAK) 0.3 mg/0.3 mL  IJ SOAJ injection Inject 0.3 mg into the muscle as needed for anaphylaxis.   famotidine (PEPCID) 20 MG tablet TAKE 1 TABLET BY MOUTH TWICE A DAY   hydrOXYzine (ATARAX/VISTARIL) 25 MG tablet Take 0.5-1 tablets (12.5-25 mg total) by mouth every 8 (eight) hours as needed for anxiety.   levothyroxine (SYNTHROID) 75 MCG tablet TAKE 1 TABLET (75 MCG TOTAL) BY MOUTH DAILY. (NEEDS LABWORK)   mirtazapine (REMERON) 7.5 MG tablet Take 1 tablet (7.5 mg total) by mouth at bedtime.   rosuvastatin (CRESTOR) 40 MG tablet TAKE 1 TABLET BY MOUTH EVERY DAY   Facility-Administered Encounter Medications as of 08/31/2021  Medication   cyanocobalamin ((VITAMIN B-12)) injection 1,000 mcg    Allergies (verified) Penicillins   History: Past Medical History:  Diagnosis Date   Allergy to alpha-gal    Anxiety    Cataract    Depression    "couple times/yr" (09/29/2014)   GERD (gastroesophageal reflux disease) 2005   Headache    "weekly" (09/29/2014)   History of hiatal hernia    Hyperlipidemia    IBS (irritable bowel syndrome)    Migraine    "2-3 times/yr" (09/29/2014)   Pernicious anemia    Pneumonia 1980's   "double"   Thyroid disease    Urticaria    Past Surgical History:  Procedure Laterality Date   ABDOMINAL HYSTERECTOMY  1985   CARDIAC CATHETERIZATION  ~ 2005   CATARACT EXTRACTION W/ INTRAOCULAR LENS  IMPLANT, BILATERAL Bilateral 2000's   CHOLECYSTECTOMY OPEN  ~ [place]  ~ 1983   Family History  Problem  Relation Age of Onset   Leukemia Father    Cancer Father        leukemia   Hypertension Mother    Stroke Mother    Arthritis Mother    Cancer Mother    Cancer Brother        4 different cancers   Asthma Brother    Birth defects Son    Social History   Socioeconomic History   Marital status: Widowed    Spouse name: Not on file   Number of children: 3   Years of education: GED   Highest education level: GED or equivalent  Occupational History    Occupation: Retired- Engineer, materials  Tobacco Use   Smoking status: Never   Smokeless tobacco: Never  Vaping Use   Vaping Use: Never used  Substance and Sexual Activity   Alcohol use: No   Drug use: Never   Sexual activity: Not Currently  Other Topics Concern   Not on file  Social History Narrative   2 story home   Her son lives with her   Very independent   Does not believe in annual screenings   Social Determinants of Health   Financial Resource Strain: Low Risk    Difficulty of Paying Living Expenses: Not hard at all  Food Insecurity: No Food Insecurity   Worried About Charity fundraiser in the Last Year: Never true   Arboriculturist in the Last Year: Never true  Transportation Needs: No Transportation Needs   Lack of Transportation (Medical): No   Lack of Transportation (Non-Medical): No  Physical Activity: Sufficiently Active   Days of Exercise per Week: 5 days   Minutes of Exercise per Session: 30 min  Stress: No Stress Concern Present   Feeling of Stress : Not at all  Social Connections: Moderately Isolated   Frequency of Communication with Friends and Family: More than three times a week   Frequency of Social Gatherings with Friends and Family: More than three times a week   Attends Religious Services: More than 4 times per year   Active Member of [employer] or Organizations: No   Attends Archivist Meetings: Never   Marital Status: Widowed    Tobacco Counseling Counseling given: Not Answered   Clinical Intake:  Pre-visit preparation completed: Yes  Pain : No/denies pain     BMI - recorded: 24.8 Nutritional Status: BMI of 19-24  Normal Nutritional Risks: None Diabetes: Yes CBG done?: No Did pt. bring in CBG monitor from home?: No  How often do you need to have someone help you when you read instructions, pamphlets, or other written materials from your doctor or pharmacy?: 1 - Never  Diabetic? no  Interpreter Needed?: No  Information  entered by :: Lariza Cothron, LPN   Activities of Daily Living In your present state of health, do you have any difficulty performing the following activities: 08/31/2021  Hearing? N  Vision? N  Difficulty concentrating or making decisions? N  Walking or climbing stairs? N  Dressing or bathing? N  Doing errands, shopping? N  Preparing Food and eating ? N  Using the Toilet? N  In the past six months, have you accidently leaked urine? N  Do you have problems with loss of bowel control? N  Managing your Medications? N  Managing your Finances? N  Housekeeping or managing your Housekeeping? N  Some recent data might be hidden    Patient Care Team: [name], DO  as PCP - General (Family Medicine) Minus Breeding, MD as PCP - Cardiology (Cardiology)  Indicate any recent Medical Services you may have received from other than [facility] providers in the past year (date may be approximate).     Assessment:   This is a routine wellness examination for [facility].  Hearing/Vision screen Hearing Screening - Comments:: Denies hearing difficulties  Vision Screening - Comments:: Wears readers prn - up to date with annual eye exams with [facility]  Dietary issues and exercise activities discussed: Current Exercise Habits: Home exercise routine, Type of exercise: walking;stretching, Time (Minutes): 30, Frequency (Times/Week): 5, Weekly Exercise (Minutes/Week): 150, Intensity: Mild, Exercise limited by: None identified   Goals Addressed             This Visit's Progress    DIET - EAT MORE FRUITS AND VEGETABLES   On track    Exercise 150 min/wk Moderate Activity   On track      Depression Screen PHQ 2/9 Scores 08/31/2021 08/31/2021 05/14/2021 02/08/2021 10/25/2020 08/30/2020 06/12/2020  PHQ - 2 Score 0 0 0 0 0 0 0  PHQ- 9 Score 1 1 - - 0 - -    Fall Risk Fall Risk  08/31/2021 05/14/2021 02/08/2021 10/25/2020 08/30/2020  Falls in the past year? 0 0 0 0 0  Number falls in past yr: 0 - - -  -  Injury with Fall? 0 - - - -  Risk for fall due to : No Fall Risks - - - -  Follow up Falls prevention discussed - - - -    FALL RISK PREVENTION PERTAINING TO THE HOME:  Any stairs in or around the home? Yes  If so, are there any without handrails? No  Home free of loose throw rugs in walkways, pet beds, electrical cords, etc? Yes  Adequate lighting in your home to reduce risk of falls? Yes   ASSISTIVE DEVICES UTILIZED TO PREVENT FALLS:  Life alert? No  Use of a cane, walker or w/c? No  Grab bars in the bathroom? Yes  Shower chair or bench in shower? No  Elevated toilet seat or a handicapped toilet? Yes   TIMED UP AND GO:  Was the test performed? No . Telephonic visit.  Cognitive Function: Normal cognitive status assessed by direct observation by this Nurse Health Advisor. No abnormalities found.    MMSE - Mini Mental State Exam 06/19/2018  Orientation to time 5  Orientation to Place 5  Registration 3  Attention/ Calculation 4  Recall 3  Language- name 2 objects 2  Language- repeat 1  Language- follow 3 step command 3  Language- read & follow direction 1  Write a sentence 1  Copy design 1  Total score 29     6CIT Screen 08/30/2020 06/23/2019  What Year? 0 points 0 points  What month? 0 points 0 points  What time? 0 points 0 points  Count back from 20 0 points 0 points  Months in reverse 0 points 0 points  Repeat phrase 0 points 0 points  Total Score 0 0    Immunizations Immunization History  Administered Date(s) Administered   Fluad Quad(high Dose 65+) 06/14/2019, 07/14/2020, 07/16/2021   Influenza, High Dose Seasonal PF 08/28/2016, 07/23/2017, 07/15/2018   Influenza,inj,Quad PF,6+ Mos 08/14/2020   Influenza-Unspecified 08/14/2014, 08/28/2016, 07/23/2017   Moderna Sars-Covid-2 Vaccination 11/18/2019   Pneumococcal Conjugate-13 12/12/2015   Pneumococcal Polysaccharide-23 02/27/2017   Tdap 06/19/2018    TDAP status: Up to date  Flu Vaccine status:  Up to  date  Pneumococcal vaccine status: Up to date  Covid-19 vaccine status: Declined, Education has been provided regarding the importance of this vaccine but patient still declined. Advised may receive this vaccine at local pharmacy or Health Dept.or vaccine clinic. Aware to provide a copy of the vaccination record if obtained from local pharmacy or Health Dept. Verbalized acceptance and understanding.  Qualifies for Shingles Vaccine? Yes   Zostavax completed No   Shingrix Completed?: No.    Education has been provided regarding the importance of this vaccine. Patient has been advised to call insurance company to determine out of pocket expense if they have not yet received this vaccine. Advised may also receive vaccine at local pharmacy or Health Dept. Verbalized acceptance and understanding.  Screening Tests Health Maintenance  Topic Date Due   Zoster Vaccines- Shingrix (1 of 2) Never done   COVID-19 Vaccine (2 - Moderna series) 12/16/2019   Fecal DNA (Cologuard)  12/03/2021   TETANUS/TDAP  06/19/2028   Pneumonia Vaccine 49+ Years old  Completed   INFLUENZA VACCINE  Completed   Hepatitis C Screening  Completed   HPV VACCINES  Aged Out   MAMMOGRAM  Discontinued   DEXA SCAN  Discontinued    Health Maintenance  Health Maintenance Due  Topic Date Due   Zoster Vaccines- Shingrix (1 of 2) Never done   COVID-19 Vaccine (2 - Moderna series) 12/16/2019    Colorectal cancer screening: Type of screening: Cologuard. Completed 12/03/2018. Repeat every 3 years  Mammogram status: No longer required due to she declines.  Bone Density scan: Declined  Lung Cancer Screening: (Low Dose CT Chest recommended if Age 76-80 years, 30 pack-year currently smoking OR have quit w/in 15years.) does not qualify  Additional Screening:  Hepatitis C Screening: does qualify; Completed 11/03/2017  Vision Screening: Recommended annual ophthalmology exams for early detection of glaucoma and other disorders of  the eye. Is the patient up to date with their annual eye exam?  Yes  Who is the provider or what is the name of the office in which the patient attends annual eye exams? [place] If pt is not established with a provider, would they like to be referred to a provider to establish care? No .   Dental Screening: Recommended annual dental exams for proper oral hygiene  Community Resource Referral / Chronic Care Management: CRR required this visit?  No   CCM required this visit?  No      Plan:     I have personally reviewed and noted the following in the patient's chart:   Medical and social history Use of alcohol, tobacco or illicit drugs  Current medications and supplements including opioid prescriptions.  Functional ability and status Nutritional status Physical activity Advanced directives List of other physicians Hospitalizations, surgeries, and ER visits in previous 12 months Vitals Screenings to include cognitive, depression, and falls Referrals and appointments  In addition, I have reviewed and discussed with patient certain preventive protocols, quality metrics, and best practice recommendations. A written personalized care plan for preventive services as well as general preventive health recommendations were provided to patient.     Sandrea Hammond, LPN   02/33/[id number]   Nurse Notes: None

## 2021-08-31 NOTE — Progress Notes (Signed)
Subjective:   Jasmin Butler is a 70 y.o. female who presents for Medicare Annual (Subsequent) preventive examination.  Virtual Visit via Telephone Note  I connected with  Jasmin Butler on 08/31/21 at 10:30 AM EST by telephone and verified that I am speaking with the correct person using two identifiers.  Location: Patient: Home Provider: [facility] Persons participating in the virtual visit: patient/Nurse Health Advisor   I discussed the limitations, risks, security and privacy concerns of performing an evaluation and management service by telephone and the availability of in person appointments. The patient expressed understanding and agreed to proceed.  Interactive audio and video telecommunications were attempted between this nurse and patient, however failed, due to patient having technical difficulties OR patient did not have access to video capability.  We continued and completed visit with audio only.  Some vital signs may be absent or patient reported.   Jasmin Bueno E Feliz Lincoln, LPN   Review of Systems     Cardiac Risk Factors include: advanced age (>37men, >37 women);dyslipidemia;Other (see comment), Risk factor comments: pernicious anemia     Objective:    Today's Vitals   08/31/21 1033  Weight: 140 lb (63.5 kg)  Height: 5\' 3"  (1.6 m)   Body mass index is 24.8 kg/m.  Advanced Directives 08/31/2021 08/30/2020 08/19/2020 06/07/2020 06/23/2019 11/29/2018 09/29/2014  Does Patient Have a Medical Advance Directive? No No No No No No No  Would patient like information on creating a medical advance directive? No - Patient declined No - Patient declined - No - Patient declined No - Patient declined - No - patient declined information    Current Medications (verified) Outpatient Encounter Medications as of 08/31/2021  Medication Sig   cyanocobalamin (,VITAMIN B-12,) 1000 MCG/ML injection Inject 1,000 mcg into the muscle every 30 (thirty) days.    EPINEPHrine (EPIPEN 2-PAK) 0.3 mg/0.3 mL  IJ SOAJ injection Inject 0.3 mg into the muscle as needed for anaphylaxis.   famotidine (PEPCID) 20 MG tablet TAKE 1 TABLET BY MOUTH TWICE A DAY   hydrOXYzine (ATARAX/VISTARIL) 25 MG tablet Take 0.5-1 tablets (12.5-25 mg total) by mouth every 8 (eight) hours as needed for anxiety.   levothyroxine (SYNTHROID) 75 MCG tablet TAKE 1 TABLET (75 MCG TOTAL) BY MOUTH DAILY. (NEEDS LABWORK)   mirtazapine (REMERON) 7.5 MG tablet Take 1 tablet (7.5 mg total) by mouth at bedtime.   rosuvastatin (CRESTOR) 40 MG tablet TAKE 1 TABLET BY MOUTH EVERY DAY   Facility-Administered Encounter Medications as of 08/31/2021  Medication   cyanocobalamin ((VITAMIN B-12)) injection 1,000 mcg    Allergies (verified) Penicillins   History: Past Medical History:  Diagnosis Date   Allergy to alpha-gal    Anxiety    Cataract    Depression    "couple times/yr" (09/29/2014)   GERD (gastroesophageal reflux disease) 2005   Headache    "weekly" (09/29/2014)   History of hiatal hernia    Hyperlipidemia    IBS (irritable bowel syndrome)    Migraine    "2-3 times/yr" (09/29/2014)   Pernicious anemia    Pneumonia 1980's   "double"   Thyroid disease    Urticaria    Past Surgical History:  Procedure Laterality Date   ABDOMINAL HYSTERECTOMY  1985   CARDIAC CATHETERIZATION  ~ 2005   CATARACT EXTRACTION W/ INTRAOCULAR LENS  IMPLANT, BILATERAL Bilateral 2000's   CHOLECYSTECTOMY OPEN  ~ [place]  ~ 1983   Family History  Problem  Relation Age of Onset   Leukemia Father    Cancer Father        leukemia   Hypertension Mother    Stroke Mother    Arthritis Mother    Cancer Mother    Cancer Brother        4 different cancers   Asthma Brother    Birth defects Son    Social History   Socioeconomic History   Marital status: Widowed    Spouse name: Not on file   Number of children: 3   Years of education: GED   Highest education level: GED or equivalent  Occupational History    Occupation: Retired- Engineer, materials  Tobacco Use   Smoking status: Never   Smokeless tobacco: Never  Vaping Use   Vaping Use: Never used  Substance and Sexual Activity   Alcohol use: No   Drug use: Never   Sexual activity: Not Currently  Other Topics Concern   Not on file  Social History Narrative   2 story home   Her son lives with her   Very independent   Does not believe in annual screenings   Social Determinants of Health   Financial Resource Strain: Low Risk    Difficulty of Paying Living Expenses: Not hard at all  Food Insecurity: No Food Insecurity   Worried About Charity fundraiser in the Last Year: Never true   Arboriculturist in the Last Year: Never true  Transportation Needs: No Transportation Needs   Lack of Transportation (Medical): No   Lack of Transportation (Non-Medical): No  Physical Activity: Sufficiently Active   Days of Exercise per Week: 5 days   Minutes of Exercise per Session: 30 min  Stress: No Stress Concern Present   Feeling of Stress : Not at all  Social Connections: Moderately Isolated   Frequency of Communication with Friends and Family: More than three times a week   Frequency of Social Gatherings with Friends and Family: More than three times a week   Attends Religious Services: More than 4 times per year   Active Member of [employer] or Organizations: No   Attends Archivist Meetings: Never   Marital Status: Widowed    Tobacco Counseling Counseling given: Not Answered   Clinical Intake:  Pre-visit preparation completed: Yes  Pain : No/denies pain     BMI - recorded: 24.8 Nutritional Status: BMI of 19-24  Normal Nutritional Risks: None Diabetes: Yes CBG done?: No Did pt. bring in CBG monitor from home?: No  How often do you need to have someone help you when you read instructions, pamphlets, or other written materials from your doctor or pharmacy?: 1 - Never  Diabetic? no  Interpreter Needed?: No  Information  entered by :: Brin Ruggerio, LPN   Activities of Daily Living In your present state of health, do you have any difficulty performing the following activities: 08/31/2021  Hearing? N  Vision? N  Difficulty concentrating or making decisions? N  Walking or climbing stairs? N  Dressing or bathing? N  Doing errands, shopping? N  Preparing Food and eating ? N  Using the Toilet? N  In the past six months, have you accidently leaked urine? N  Do you have problems with loss of bowel control? N  Managing your Medications? N  Managing your Finances? N  Housekeeping or managing your Housekeeping? N  Some recent data might be hidden    Patient Care Team: [name], DO  as PCP - General (Family Medicine) Minus Breeding, MD as PCP - Cardiology (Cardiology)  Indicate any recent Medical Services you may have received from other than [facility] providers in the past year (date may be approximate).     Assessment:   This is a routine wellness examination for [facility].  Hearing/Vision screen Hearing Screening - Comments:: Denies hearing difficulties  Vision Screening - Comments:: Wears readers prn - up to date with annual eye exams with [facility]  Dietary issues and exercise activities discussed: Current Exercise Habits: Home exercise routine, Type of exercise: walking;stretching, Time (Minutes): 30, Frequency (Times/Week): 5, Weekly Exercise (Minutes/Week): 150, Intensity: Mild, Exercise limited by: None identified   Goals Addressed             This Visit's Progress    DIET - EAT MORE FRUITS AND VEGETABLES   On track    Exercise 150 min/wk Moderate Activity   On track     Depression Screen PHQ 2/9 Scores 08/31/2021 08/31/2021 05/14/2021 02/08/2021 10/25/2020 08/30/2020 06/12/2020  PHQ - 2 Score 0 0 0 0 0 0 0  PHQ- 9 Score 1 1 - - 0 - -    Fall Risk Fall Risk  08/31/2021 05/14/2021 02/08/2021 10/25/2020 08/30/2020  Falls in the past year? 0 0 0 0 0  Number falls in past yr: 0 - - - -   Injury with Fall? 0 - - - -  Risk for fall due to : No Fall Risks - - - -  Follow up Falls prevention discussed - - - -    FALL RISK PREVENTION PERTAINING TO THE HOME:  Any stairs in or around the home? Yes  If so, are there any without handrails? No  Home free of loose throw rugs in walkways, pet beds, electrical cords, etc? Yes  Adequate lighting in your home to reduce risk of falls? Yes   ASSISTIVE DEVICES UTILIZED TO PREVENT FALLS:  Life alert? No  Use of a cane, walker or w/c? No  Grab bars in the bathroom? Yes  Shower chair or bench in shower? No  Elevated toilet seat or a handicapped toilet? Yes   TIMED UP AND GO:  Was the test performed? No . Telephonic visit  Cognitive Function: Normal cognitive status assessed by direct observation by this Nurse Health Advisor. No abnormalities found.    MMSE - Mini Mental State Exam 06/19/2018  Orientation to time 5  Orientation to Place 5  Registration 3  Attention/ Calculation 4  Recall 3  Language- name 2 objects 2  Language- repeat 1  Language- follow 3 step command 3  Language- read & follow direction 1  Write a sentence 1  Copy design 1  Total score 29     6CIT Screen 08/30/2020 06/23/2019  What Year? 0 points 0 points  What month? 0 points 0 points  What time? 0 points 0 points  Count back from 20 0 points 0 points  Months in reverse 0 points 0 points  Repeat phrase 0 points 0 points  Total Score 0 0    Immunizations Immunization History  Administered Date(s) Administered   Fluad Quad(high Dose 65+) 06/14/2019, 07/14/2020, 07/16/2021   Influenza, High Dose Seasonal PF 08/28/2016, 07/23/2017, 07/15/2018   Influenza,inj,Quad PF,6+ Mos 08/14/2020   Influenza-Unspecified 08/14/2014, 08/28/2016, 07/23/2017   Moderna Sars-Covid-2 Vaccination 11/18/2019   Pneumococcal Conjugate-13 12/12/2015   Pneumococcal Polysaccharide-23 02/27/2017   Tdap 06/19/2018    TDAP status: Up to date  Flu Vaccine status: Up  to  date  Pneumococcal vaccine status: Up to date  Covid-19 vaccine status: Declined, Education has been provided regarding the importance of this vaccine but patient still declined. Advised may receive this vaccine at local pharmacy or Health Dept.or vaccine clinic. Aware to provide a copy of the vaccination record if obtained from local pharmacy or Health Dept. Verbalized acceptance and understanding.  Qualifies for Shingles Vaccine? Yes   Zostavax completed No   Shingrix Completed?: No.    Education has been provided regarding the importance of this vaccine. Patient has been advised to call insurance company to determine out of pocket expense if they have not yet received this vaccine. Advised may also receive vaccine at local pharmacy or Health Dept. Verbalized acceptance and understanding.  Screening Tests Health Maintenance  Topic Date Due   Zoster Vaccines- Shingrix (1 of 2) Never done   COVID-19 Vaccine (2 - Moderna series) 12/16/2019   Fecal DNA (Cologuard)  12/03/2021   TETANUS/TDAP  06/19/2028   Pneumonia Vaccine 74+ Years old  Completed   INFLUENZA VACCINE  Completed   Hepatitis C Screening  Completed   HPV VACCINES  Aged Out   MAMMOGRAM  Discontinued   DEXA SCAN  Discontinued    Health Maintenance  Health Maintenance Due  Topic Date Due   Zoster Vaccines- Shingrix (1 of 2) Never done   COVID-19 Vaccine (2 - Moderna series) 12/16/2019    Colorectal cancer screening: Type of screening: Cologuard. Completed 12/03/2018. Repeat every 3 years  Mammogram status: No longer required due to she declines.  Bone Density scan: Declined  Lung Cancer Screening: (Low Dose CT Chest recommended if Age 34-80 years, 30 pack-year currently smoking OR have quit w/in 15years.) does not qualify.    Additional Screening:  Hepatitis C Screening: does qualify; Completed 11/03/2017  Vision Screening: Recommended annual ophthalmology exams for early detection of glaucoma and other disorders  of the eye. Is the patient up to date with their annual eye exam?  Yes  Who is the provider or what is the name of the office in which the patient attends annual eye exams? [place] If pt is not established with a provider, would they like to be referred to a provider to establish care? No .   Dental Screening: Recommended annual dental exams for proper oral hygiene  Community Resource Referral / Chronic Care Management: CRR required this visit?  No   CCM required this visit?  No      Plan:     I have personally reviewed and noted the following in the patient's chart:   Medical and social history Use of alcohol, tobacco or illicit drugs  Current medications and supplements including opioid prescriptions.  Functional ability and status Nutritional status Physical activity Advanced directives List of other physicians Hospitalizations, surgeries, and ER visits in previous 12 months Vitals Screenings to include cognitive, depression, and falls Referrals and appointments  In addition, I have reviewed and discussed with patient certain preventive protocols, quality metrics, and best practice recommendations. A written personalized care plan for preventive services as well as general preventive health recommendations were provided to patient.     Sandrea Hammond, LPN   [id number]   Nurse Notes: none

## 2021-08-31 NOTE — Patient Instructions (Signed)
Jasmin Butler , Thank you for taking time to come for your Medicare Wellness Visit. I appreciate your ongoing commitment to your health goals. Please review the following plan we discussed and let me know if I can assist you in the future.   Screening recommendations/referrals: Colonoscopy: Cologuard done 12/03/2018 - repeat every 3 years Mammogram: Declined - continue self exams  Bone Density: Declined Recommended yearly ophthalmology/optometry visit for glaucoma screening and checkup Recommended yearly dental visit for hygiene and checkup  Vaccinations: Influenza vaccine: Done 07/16/2021 - Repeat annually  Pneumococcal vaccine: Done 12/12/2015 & 02/27/2017 Tdap vaccine: Done 06/19/2018 - Repeat in 10 years  Shingles vaccine: Due - consider getting this next year   Covid-19: Done 2021 - only one dose - unsure of date - declines additional boosters  Advanced directives: Advance directive discussed with you today. Even though you declined this today, please call our office should you change your mind, and we can give you the proper paperwork for you to fill out.   Conditions/risks identified: Aim for 30 minutes of exercise or brisk walking each day, drink 6-8 glasses of water and eat lots of fruits and vegetables.   Next appointment: Follow up in one year for your annual wellness visit    Preventive Care 65 Years and Older, Female Preventive care refers to lifestyle choices and visits with your health care provider that can promote health and wellness. What does preventive care include? A yearly physical exam. This is also called an annual well check. Dental exams once or twice a year. Routine eye exams. Ask your health care provider how often you should have your eyes checked. Personal lifestyle choices, including: Daily care of your teeth and gums. Regular physical activity. Eating a healthy diet. Avoiding tobacco and drug use. Limiting alcohol use. Practicing safe sex. Taking low-dose  aspirin every day. Taking vitamin and mineral supplements as recommended by your health care provider. What happens during an annual well check? The services and screenings done by your health care provider during your annual well check will depend on your age, overall health, lifestyle risk factors, and family history of disease. Counseling  Your health care provider may ask you questions about your: Alcohol use. Tobacco use. Drug use. Emotional well-being. Home and relationship well-being. Sexual activity. Eating habits. History of falls. Memory and ability to understand (cognition). Work and work Statistician. Reproductive health. Screening  You may have the following tests or measurements: Height, weight, and BMI. Blood pressure. Lipid and cholesterol levels. These may be checked every 5 years, or more frequently if you are over 46 years old. Skin check. Lung cancer screening. You may have this screening every year starting at age 80 if you have a 30-pack-year history of smoking and currently smoke or have quit within the past 15 years. Fecal occult blood test (FOBT) of the stool. You may have this test every year starting at age 9. Flexible sigmoidoscopy or colonoscopy. You may have a sigmoidoscopy every 5 years or a colonoscopy every 10 years starting at age 70. Hepatitis C blood test. Hepatitis B blood test. Sexually transmitted disease (STD) testing. Diabetes screening. This is done by checking your blood sugar (glucose) after you have not eaten for a while (fasting). You may have this done every 1-3 years. Bone density scan. This is done to screen for osteoporosis. You may have this done starting at age 90. Mammogram. This may be done every 1-2 years. Talk to your health care provider about how often you  should have regular mammograms. Talk with your health care provider about your test results, treatment options, and if necessary, the need for more tests. Vaccines  Your  health care provider may recommend certain vaccines, such as: Influenza vaccine. This is recommended every year. Tetanus, diphtheria, and acellular pertussis (Tdap, Td) vaccine. You may need a Td booster every 10 years. Zoster vaccine. You may need this after age 52. Pneumococcal 13-valent conjugate (PCV13) vaccine. One dose is recommended after age 48. Pneumococcal polysaccharide (PPSV23) vaccine. One dose is recommended after age 11. Talk to your health care provider about which screenings and vaccines you need and how often you need them. This information is not intended to replace advice given to you by your health care provider. Make sure you discuss any questions you have with your health care provider. Document Released: 10/27/2015 Document Revised: 06/19/2016 Document Reviewed: 08/01/2015 Elsevier Interactive Patient Education  2017 [place] Prevention in the Home Falls can cause injuries. They can happen to people of all ages. There are many things you can do to make your home safe and to help prevent falls. What can I do on the outside of my home? Regularly fix the edges of walkways and driveways and fix any cracks. Remove anything that might make you trip as you walk through a door, such as a raised step or threshold. Trim any bushes or trees on the path to your home. Use bright outdoor lighting. Clear any walking paths of anything that might make someone trip, such as rocks or tools. Regularly check to see if handrails are loose or broken. Make sure that both sides of any steps have handrails. Any raised decks and porches should have guardrails on the edges. Have any leaves, snow, or ice cleared regularly. Use sand or salt on walking paths during winter. Clean up any spills in your garage right away. This includes oil or grease spills. What can I do in the bathroom? Use night lights. Install grab bars by the toilet and in the tub and shower. Do not use towel bars as  grab bars. Use non-skid mats or decals in the tub or shower. If you need to sit down in the shower, use a plastic, non-slip stool. Keep the floor dry. Clean up any water that spills on the floor as soon as it happens. Remove soap buildup in the tub or shower regularly. Attach bath mats securely with double-sided non-slip rug tape. Do not have throw rugs and other things on the floor that can make you trip. What can I do in the bedroom? Use night lights. Make sure that you have a light by your bed that is easy to reach. Do not use any sheets or blankets that are too big for your bed. They should not hang down onto the floor. Have a firm chair that has side arms. You can use this for support while you get dressed. Do not have throw rugs and other things on the floor that can make you trip. What can I do in the kitchen? Clean up any spills right away. Avoid walking on wet floors. Keep items that you use a lot in easy-to-reach places. If you need to reach something above you, use a strong step stool that has a grab bar. Keep electrical cords out of the way. Do not use floor polish or wax that makes floors slippery. If you must use wax, use non-skid floor wax. Do not have throw rugs and other things on the  floor that can make you trip. What can I do with my stairs? Do not leave any items on the stairs. Make sure that there are handrails on both sides of the stairs and use them. Fix handrails that are broken or loose. Make sure that handrails are as long as the stairways. Check any carpeting to make sure that it is firmly attached to the stairs. Fix any carpet that is loose or worn. Avoid having throw rugs at the top or bottom of the stairs. If you do have throw rugs, attach them to the floor with carpet tape. Make sure that you have a light switch at the top of the stairs and the bottom of the stairs. If you do not have them, ask someone to add them for you. What else can I do to help prevent  falls? Wear shoes that: Do not have high heels. Have rubber bottoms. Are comfortable and fit you well. Are closed at the toe. Do not wear sandals. If you use a stepladder: Make sure that it is fully opened. Do not climb a closed stepladder. Make sure that both sides of the stepladder are locked into place. Ask someone to hold it for you, if possible. Clearly mark and make sure that you can see: Any grab bars or handrails. First and last steps. Where the edge of each step is. Use tools that help you move around (mobility aids) if they are needed. These include: Canes. Walkers. Scooters. Crutches. Turn on the lights when you go into a dark area. Replace any light bulbs as soon as they burn out. Set up your furniture so you have a clear path. Avoid moving your furniture around. If any of your floors are uneven, fix them. If there are any pets around you, be aware of where they are. Review your medicines with your doctor. Some medicines can make you feel dizzy. This can increase your chance of falling. Ask your doctor what other things that you can do to help prevent falls. This information is not intended to replace advice given to you by your health care provider. Make sure you discuss any questions you have with your health care provider. Document Released: 07/27/2009 Document Revised: 03/07/2016 Document Reviewed: 11/04/2014 Elsevier Interactive Patient Education  2017 [place].   Cholesterol Content in Foods Cholesterol is a waxy, fat-like substance that helps to carry fat in the blood. The body needs cholesterol in small amounts, but too much cholesterol can cause damage to the arteries and heart. What foods have cholesterol? Cholesterol is found in animal-based foods, such as meat, seafood, and dairy. Generally, low-fat dairy and lean meats have less cholesterol than full-fat dairy and fatty meats. The milligrams of cholesterol per serving (mg per serving) of common  cholesterol-containing foods are listed below. Meats and other proteins Egg -- one large whole egg has 186 mg. Veal shank -- 4 oz (113 g) has 141 mg. Lean ground [place] (93% lean) -- 4 oz (113 g) has 118 mg. Fat-trimmed lamb loin -- 4 oz (113 g) has 106 mg. Lean ground beef (90% lean) -- 4 oz (113 g) has 100 mg. Lobster -- 3.5 oz (99 g) has 90 mg. Pork loin chops -- 4 oz (113 g) has 86 mg. Canned salmon -- 3.5 oz (99 g) has 83 mg. Fat-trimmed beef top loin -- 4 oz (113 g) has 78 mg. Frankfurter -- 1 frank (3.5 oz or 99 g) has 77 mg. Crab -- 3.5 oz (99 g) has 71  mg. Roasted chicken without skin, white meat -- 4 oz (113 g) has 66 mg. Light bologna -- 2 oz (57 g) has 45 mg. Deli-cut [place] -- 2 oz (57 g) has 31 mg. Canned tuna -- 3.5 oz (99 g) has 31 mg. Berniece Salines -- 1 oz (28 g) has 29 mg. Oysters and mussels (raw) -- 3.5 oz (99 g) has 25 mg. Mackerel -- 1 oz (28 g) has 22 mg. Trout -- 1 oz (28 g) has 20 mg. Pork sausage -- 1 link (1 oz or 28 g) has 17 mg. Salmon -- 1 oz (28 g) has 16 mg. Tilapia -- 1 oz (28 g) has 14 mg. Dairy Soft-serve ice cream --  cup (4 oz or 86 g) has 103 mg. Whole-milk yogurt -- 1 cup (8 oz or 245 g) has 29 mg. Cheddar cheese -- 1 oz (28 g) has 28 mg. American cheese -- 1 oz (28 g) has 28 mg. Whole milk -- 1 cup (8 oz or 250 mL) has 23 mg. 2% milk -- 1 cup (8 oz or 250 mL) has 18 mg. Cream cheese -- 1 tablespoon (Tbsp) (14.5 g) has 15 mg. Cottage cheese --  cup (4 oz or 113 g) has 14 mg. Low-fat (1%) milk -- 1 cup (8 oz or 250 mL) has 10 mg. Sour cream -- 1 Tbsp (12 g) has 8.5 mg. Low-fat yogurt -- 1 cup (8 oz or 245 g) has 8 mg. Nonfat Greek yogurt -- 1 cup (8 oz or 228 g) has 7 mg. Half-and-half cream -- 1 Tbsp (15 mL) has 5 mg. Fats and oils Cod liver oil -- 1 tablespoon (Tbsp) (13.6 g) has 82 mg. Butter -- 1 Tbsp (14 g) has 15 mg. Lard -- 1 Tbsp (12.8 g) has 14 mg. Bacon grease -- 1 Tbsp (12.9 g) has 14 mg. Mayonnaise -- 1 Tbsp (13.8 g) has 5-10  mg. Margarine -- 1 Tbsp (14 g) has 3-10 mg. The items listed above may not be a complete list of foods with cholesterol. Exact amounts of cholesterol in these foods may vary depending on specific ingredients and brands. Contact a dietitian for more information. What foods do not have cholesterol? Most plant-based foods do not have cholesterol unless you combine them with a food that has cholesterol. Foods without cholesterol include: Grains and cereals. Vegetables. Fruits. Vegetable oils, such as olive, canola, and sunflower oil. Legumes, such as peas, beans, and lentils. Nuts and seeds. Egg whites. The items listed above may not be a complete list of foods that do not have cholesterol. Contact a dietitian for more information. Summary The body needs cholesterol in small amounts, but too much cholesterol can cause damage to the arteries and heart. Cholesterol is found in animal-based foods, such as meat, seafood, and dairy. Generally, low-fat dairy and lean meats have less cholesterol than full-fat dairy and fatty meats. This information is not intended to replace advice given to you by your health care provider. Make sure you discuss any questions you have with your health care provider. Document Revised: 02/09/2021 Document Reviewed: 02/09/2021 Elsevier Patient Education  [place].

## 2021-09-14 ENCOUNTER — Ambulatory Visit (INDEPENDENT_AMBULATORY_CARE_PROVIDER_SITE_OTHER): Payer: Medicare HMO | Admitting: *Deleted

## 2021-09-14 DIAGNOSIS — E538 Deficiency of other specified B group vitamins: Secondary | ICD-10-CM

## 2021-09-14 NOTE — Progress Notes (Signed)
B12 given in right deltoid without any adverse reactions

## 2021-09-24 ENCOUNTER — Other Ambulatory Visit: Payer: Self-pay | Admitting: Family Medicine

## 2021-09-24 DIAGNOSIS — F411 Generalized anxiety disorder: Secondary | ICD-10-CM

## 2021-09-24 MED ORDER — HYDROXYZINE HCL 25 MG PO TABS: 12.5000 mg | ORAL_TABLET | Freq: Three times a day (TID) | ORAL | 0 refills | Status: DC | PRN

## 2021-09-28 ENCOUNTER — Other Ambulatory Visit: Payer: Self-pay | Admitting: Family Medicine

## 2021-09-28 DIAGNOSIS — F411 Generalized anxiety disorder: Secondary | ICD-10-CM

## 2021-09-28 NOTE — Telephone Encounter (Signed)
This is a prn med. 90 DS not indicated

## 2021-10-16 ENCOUNTER — Ambulatory Visit (INDEPENDENT_AMBULATORY_CARE_PROVIDER_SITE_OTHER): Payer: Medicare HMO | Admitting: *Deleted

## 2021-10-16 DIAGNOSIS — E538 Deficiency of other specified B group vitamins: Secondary | ICD-10-CM

## 2021-10-16 NOTE — Progress Notes (Signed)
Patient in today for monthly B 12 injection. 1 ml given in left deltoid. Patient tolerated well.

## 2021-10-21 ENCOUNTER — Other Ambulatory Visit: Payer: Self-pay | Admitting: Family Medicine

## 2021-10-21 DIAGNOSIS — F411 Generalized anxiety disorder: Secondary | ICD-10-CM

## 2021-10-21 DIAGNOSIS — G479 Sleep disorder, unspecified: Secondary | ICD-10-CM

## 2021-10-21 DIAGNOSIS — L509 Urticaria, unspecified: Secondary | ICD-10-CM

## 2021-10-22 ENCOUNTER — Other Ambulatory Visit: Payer: Self-pay | Admitting: Family Medicine

## 2021-10-22 DIAGNOSIS — L509 Urticaria, unspecified: Secondary | ICD-10-CM

## 2021-10-22 MED ORDER — LEVOTHYROXINE SODIUM 75 MCG PO TABS: 75.0000 ug | ORAL_TABLET | Freq: Every day | ORAL | 0 refills | Status: DC

## 2021-10-22 MED ORDER — HYDROXYZINE HCL 25 MG PO TABS: 12.5000 mg | ORAL_TABLET | Freq: Three times a day (TID) | ORAL | 0 refills | Status: DC | PRN

## 2021-10-24 ENCOUNTER — Other Ambulatory Visit: Payer: Self-pay | Admitting: *Deleted

## 2021-10-24 ENCOUNTER — Encounter: Payer: Self-pay | Admitting: Family Medicine

## 2021-10-24 DIAGNOSIS — E034 Atrophy of thyroid (acquired): Secondary | ICD-10-CM

## 2021-10-24 DIAGNOSIS — E538 Deficiency of other specified B group vitamins: Secondary | ICD-10-CM

## 2021-10-24 DIAGNOSIS — E782 Mixed hyperlipidemia: Secondary | ICD-10-CM

## 2021-10-24 DIAGNOSIS — I10 Essential (primary) hypertension: Secondary | ICD-10-CM

## 2021-10-26 ENCOUNTER — Other Ambulatory Visit: Payer: Medicare HMO

## 2021-10-26 DIAGNOSIS — E782 Mixed hyperlipidemia: Secondary | ICD-10-CM

## 2021-10-26 DIAGNOSIS — E034 Atrophy of thyroid (acquired): Secondary | ICD-10-CM

## 2021-10-26 DIAGNOSIS — I10 Essential (primary) hypertension: Secondary | ICD-10-CM | POA: Diagnosis not present

## 2021-10-26 DIAGNOSIS — E538 Deficiency of other specified B group vitamins: Secondary | ICD-10-CM

## 2021-10-27 LAB — CBC WITH DIFFERENTIAL/PLATELET
Basophils Absolute: 0.1 x10E3/uL (ref 0.0–0.2)
Basos: 2 %
EOS (ABSOLUTE): 0.2 x10E3/uL (ref 0.0–0.4)
Eos: 3 %
Hematocrit: 38.3 % (ref 34.0–46.6)
Hemoglobin: 12.4 g/dL (ref 11.1–15.9)
Immature Grans (Abs): 0 x10E3/uL (ref 0.0–0.1)
Immature Granulocytes: 0 %
Lymphocytes Absolute: 1.9 x10E3/uL (ref 0.7–3.1)
Lymphs: 34 %
MCH: 27.4 pg (ref 26.6–33.0)
MCHC: 32.4 g/dL (ref 31.5–35.7)
MCV: 85 fL (ref 79–97)
Monocytes Absolute: 0.4 x10E3/uL (ref 0.1–0.9)
Monocytes: 8 %
Neutrophils Absolute: 2.9 x10E3/uL (ref 1.4–7.0)
Neutrophils: 53 %
Platelets: 254 x10E3/uL (ref 150–450)
RBC: 4.53 x10E6/uL (ref 3.77–5.28)
RDW: 13.6 % (ref 11.7–15.4)
WBC: 5.5 x10E3/uL (ref 3.4–10.8)

## 2021-10-27 LAB — CMP14+EGFR
ALT: 13 IU/L (ref 0–32)
AST: 27 IU/L (ref 0–40)
Albumin/Globulin Ratio: 2 (ref 1.2–2.2)
Albumin: 4.6 g/dL (ref 3.7–4.7)
Alkaline Phosphatase: 70 IU/L (ref 44–121)
BUN/Creatinine Ratio: 13 (ref 12–28)
BUN: 10 mg/dL (ref 8–27)
Bilirubin Total: 0.3 mg/dL (ref 0.0–1.2)
CO2: 23 mmol/L (ref 20–29)
Calcium: 10.5 mg/dL — ABNORMAL HIGH (ref 8.7–10.3)
Chloride: 101 mmol/L (ref 96–106)
Creatinine, Ser: 0.79 mg/dL (ref 0.57–1.00)
Globulin, Total: 2.3 g/dL (ref 1.5–4.5)
Glucose: 97 mg/dL (ref 70–99)
Potassium: 4.3 mmol/L (ref 3.5–5.2)
Sodium: 145 mmol/L — ABNORMAL HIGH (ref 134–144)
Total Protein: 6.9 g/dL (ref 6.0–8.5)
eGFR: 79 mL/min/{1.73_m2} (ref >59)

## 2021-10-27 LAB — THYROID PANEL WITH TSH
Free Thyroxine Index: 2 (ref 1.2–4.9)
T3 Uptake Ratio: 26 % (ref 24–39)
T4, Total: 7.7 ug/dL (ref 4.5–12.0)
TSH: 1.82 u[IU]/mL (ref 0.450–4.500)

## 2021-10-27 LAB — LIPID PANEL
Chol/HDL Ratio: 2.8 ratio (ref 0.0–4.4)
Cholesterol, Total: 178 mg/dL (ref 100–199)
HDL: 63 mg/dL (ref >39)
LDL Chol Calc (NIH): 92 mg/dL (ref 0–99)
Triglycerides: 132 mg/dL (ref 0–149)
VLDL Cholesterol Cal: 23 mg/dL (ref 5–40)

## 2021-10-27 LAB — VITAMIN B12: Vitamin B-12: 645 pg/mL (ref 232–1245)

## 2021-10-29 ENCOUNTER — Encounter: Payer: Self-pay | Admitting: Family Medicine

## 2021-10-29 ENCOUNTER — Ambulatory Visit (INDEPENDENT_AMBULATORY_CARE_PROVIDER_SITE_OTHER): Payer: Medicare HMO | Admitting: Family Medicine

## 2021-10-29 VITALS — BP 140/80 | HR 69 | Temp 98.3°F | Ht 63.0 in | Wt 143.8 lb

## 2021-10-29 DIAGNOSIS — L570 Actinic keratosis: Secondary | ICD-10-CM

## 2021-10-29 DIAGNOSIS — H02403 Unspecified ptosis of bilateral eyelids: Secondary | ICD-10-CM | POA: Diagnosis not present

## 2021-10-29 DIAGNOSIS — E782 Mixed hyperlipidemia: Secondary | ICD-10-CM

## 2021-10-29 DIAGNOSIS — G479 Sleep disorder, unspecified: Secondary | ICD-10-CM | POA: Diagnosis not present

## 2021-10-29 DIAGNOSIS — F411 Generalized anxiety disorder: Secondary | ICD-10-CM | POA: Diagnosis not present

## 2021-10-29 MED ORDER — MIRTAZAPINE 7.5 MG PO TABS: 7.5000 mg | ORAL_TABLET | Freq: Every day | ORAL | 3 refills | Status: DC

## 2021-10-29 MED ORDER — ROSUVASTATIN CALCIUM 40 MG PO TABS: 40.0000 mg | ORAL_TABLET | ORAL | 3 refills | Status: DC

## 2021-10-29 NOTE — Patient Instructions (Signed)
Ok to take Crestor every other day Come in for fasting cholesterol panel in about 3 months  I referred you to Dr Trena Platt office for your eyelids  Cryoablation, Care After This sheet gives you information about how to care for yourself after your procedure. Your health care provider may also give you more specific instructions. If you have problems or questions, contact your health care provider. What can I expect after the procedure? After the procedure, it is common to have: Soreness around the treatment area. Mild pain and swelling in the treatment area. Follow these instructions at home: Treatment area care  If you have an incision, follow instructions from your health care provider about how to take care of it. Make sure you: Wash your hands with soap and water for at least 20 seconds before and after you change your bandage (dressing). If soap and water are not available, use hand sanitizer. Change your dressing as told by your health care provider. Leave stitches (sutures), skin glue, or adhesive strips in place. These skin closures may need to stay in place for 2 weeks or longer. If adhesive strip edges start to loosen and curl up, you may trim the loose edges. Do not remove adhesive strips completely unless your health care provider tells you to do that. Check your treatment area every day for signs of infection. Check for: More redness, swelling, or pain. Fluid or blood. Warmth. Pus or a bad smell. Keep the treated area clean, dry, and covered with a dressing until it has healed. Clean the area with soap and water or as told by your health care provider. If your bandage gets wet, change it right away. Do not take baths, swim, or use a hot tub until your health care provider approves. Ask your health care provider if you may take showers. You may only be allowed to take sponge baths. Activity  Follow instructions from your health care provider about any activity limitations,  including lifting heavy objects. If you were given a sedative during the procedure, it can affect you for several hours. Do not drive or operate machinery until your health care provider says that it is safe. General instructions Take over-the-counter and prescription medicines only as told by your health care provider. Do not use any products that contain nicotine or tobacco, such as cigarettes, e-cigarettes, and chewing tobacco. These can delay incision healing. If you need help quitting, ask your health care provider. Keep all follow-up visits as told by your health care provider. This is important. Contact a health care provider if: You have increased pain. You have a fever. You have nausea or vomiting. You have any of these signs of infection: More redness, swelling, or pain around your treatment area. Fluid or blood coming from your treatment area. Warmth coming from your treatment area. Pus or a bad smell coming from your treatment area. You do not have a bowel movement for 2 days. Get help right away if you have: Severe pain. Trouble swallowing or breathing. Severe weakness or dizziness. Chest pain or shortness of breath. These symptoms may represent a serious problem that is an emergency. Do not wait to see if the symptoms will go away. Get medical help right away. Call your local emergency services (911 in the [place] Do not drive yourself to the hospital. Summary After the procedure, it is common for the treatment area to be sore, mildly painful, and swollen. If you have a dressing, change it as told by your health  care provider. Follow instructions from your health care provider about any activity limitations. Contact a health care provider if you have increased pain or a fever. This information is not intended to replace advice given to you by your health care provider. Make sure you discuss any questions you have with your health care provider. Document Revised: 07/08/2019  Document Reviewed: 07/08/2019 Elsevier Patient Education  [place].

## 2021-10-29 NOTE — Addendum Note (Signed)
Addended by: [name] on: 10/29/2021 01:24 PM   Modules accepted: Orders

## 2021-10-29 NOTE — Progress Notes (Signed)
Subjective: CC: Follow-up sleep, anxiety PCP: [name], DO MGQ:QPYP Emmanuella Mirante is a 71 y.o. female presenting to clinic today for:  1.  Generalized anxiety disorder with impaired sleep Patient did not tolerate BuSpar and she was subsequently switched to mirtazapine 7.5 mg at bedtime at her last visit 2 months ago.  She notes that this has improved her symptoms quite a bit and that she is able to go to the store without having anxiety or panic attacks.  She reports no concerning symptoms or signs from the medication and would like to continue it at current dose  2.  Hyperlipidemia Patient had been compliant with Crestor 40 mg daily up until a couple of days ago.  She was having quite a bit of pain in her shoulders and felt that this may have been the cause.  She has noticed some improvement in her pain since discontinuation.  She is currently taking garlic over-the-counter and Metamucil.  No chest pain, shortness of breath  3.  Skin lesion Patient reports a skin lesion on her left upper back that is been present for at least 6 months.  She reports that it scales and then she scratches it off and it recurs.  Does not feel like it is getting bigger necessarily but it has not resolved  4.  Droopy eyelids Patient reports that her eyelids are getting so droopy that they are starting to affect her ability to see out the corners of her eyes.  She would like to have this evaluated and potentially surgically intervened   ROS: Per HPI  Allergies  Allergen Reactions   Penicillins Rash    Did it involve swelling of the face/tongue/throat, SOB, or low BP? Yes Did it involve sudden or severe rash/hives, skin peeling, or any reaction on the inside of your mouth or nose? Unk Did you need to seek medical attention at a hospital or doctor's office? Yes When did it last happen? 30 years ago If all above answers are NO, may proceed with cephalosporin use.    Past Medical History:  Diagnosis  Date   Allergy to alpha-gal    Anxiety    Cataract    Depression    "couple times/yr" (09/29/2014)   GERD (gastroesophageal reflux disease) 2005   Headache    "weekly" (09/29/2014)   History of hiatal hernia    Hyperlipidemia    IBS (irritable bowel syndrome)    Migraine    "2-3 times/yr" (09/29/2014)   Pernicious anemia    Pneumonia 1980's   "double"   Thyroid disease    Urticaria     Current Outpatient Medications:    cyanocobalamin (,VITAMIN B-12,) 1000 MCG/ML injection, Inject 1,000 mcg into the muscle every 30 (thirty) days. , Disp: , Rfl:    EPINEPHrine (EPIPEN 2-PAK) 0.3 mg/0.3 mL IJ SOAJ injection, Inject 0.3 mg into the muscle as needed for anaphylaxis., Disp: 1 each, Rfl: 2   famotidine (PEPCID) 20 MG tablet, TAKE 1 TABLET (20 MG TOTAL) BY MOUTH 2 (TWO) TIMES DAILY. (NEEDS TO BE SEEN BEFORE NEXT REFILL), Disp: 180 tablet, Rfl: 1   hydrOXYzine (ATARAX) 25 MG tablet, Take 0.5-1 tablets (12.5-25 mg total) by mouth every 8 (eight) hours as needed for anxiety., Disp: 30 tablet, Rfl: 0   levothyroxine (SYNTHROID) 75 MCG tablet, Take 1 tablet (75 mcg total) by mouth daily. (Needs Labwork), Disp: 30 tablet, Rfl: 0   mirtazapine (REMERON) 7.5 MG tablet, Take 1 tablet (7.5 mg total) by mouth  at bedtime. (NEEDS TO BE SEEN BEFORE NEXT REFILL), Disp: 30 tablet, Rfl: 0   rosuvastatin (CRESTOR) 40 MG tablet, TAKE 1 TABLET BY MOUTH EVERY DAY, Disp: 90 tablet, Rfl: 3  Current Facility-Administered Medications:    cyanocobalamin ((VITAMIN B-12)) injection 1,000 mcg, 1,000 mcg, Intramuscular, Q30 days, Valbona Slabach M, DO, 1,000 mcg at 10/16/21 1051 Social History   Socioeconomic History   Marital status: Widowed    Spouse name: Not on file   Number of children: 3   Years of education: GED   Highest education level: GED or equivalent  Occupational History   Occupation: Retired- Engineer, materials  Tobacco Use   Smoking status: Never   Smokeless tobacco: Never  Vaping Use   Vaping  Use: Never used  Substance and Sexual Activity   Alcohol use: No   Drug use: Never   Sexual activity: Not Currently  Other Topics Concern   Not on file  Social History Narrative   2 story home   Her son lives with her   Very independent   Does not believe in annual screenings   Social Determinants of Health   Financial Resource Strain: Low Risk    Difficulty of Paying Living Expenses: Not hard at all  Food Insecurity: No Food Insecurity   Worried About Charity fundraiser in the Last Year: Never true   Arboriculturist in the Last Year: Never true  Transportation Needs: No Transportation Needs   Lack of Transportation (Medical): No   Lack of Transportation (Non-Medical): No  Physical Activity: Sufficiently Active   Days of Exercise per Week: 5 days   Minutes of Exercise per Session: 30 min  Stress: No Stress Concern Present   Feeling of Stress : Not at all  Social Connections: Moderately Isolated   Frequency of Communication with Friends and Family: More than three times a week   Frequency of Social Gatherings with Friends and Family: More than three times a week   Attends Religious Services: More than 4 times per year   Active Member of [employer] or Organizations: No   Attends Archivist Meetings: Never   Marital Status: Widowed  Human resources officer Violence: Not At Risk   Fear of Current or Ex-Partner: No   Emotionally Abused: No   Physically Abused: No   Sexually Abused: No   Family History  Problem Relation Age of Onset   Leukemia Father    Cancer Father        leukemia   Hypertension Mother    Stroke Mother    Arthritis Mother    Cancer Mother    Cancer Brother        4 different cancers   Asthma Brother    Birth defects Son     Objective: Office vital signs reviewed. BP 140/80    Pulse 69    Temp 98.3 F (36.8 C)    Ht 5\' 3"  (1.6 m)    Wt 143 lb 12.8 oz (65.2 kg)    SpO2 99%    BMI 25.47 kg/m   Physical Examination:  General: Awake, alert, well  nourished, No acute distress HEENT: No exophthalmos or goiter Cardio: regular rate   Pulm: Normal work of breathing on room air Skin: dry; intact; she has a scaly lesion that is less than 2 mm in size on her left upper back Neuro: No tremor Psych: Mood stable, speech normal  Depression screen [facility] 2/9 10/29/2021 08/31/2021 08/31/2021  Decreased Interest 0  0 0  Down, Depressed, Hopeless 0 0 0  PHQ - 2 Score 0 0 0  Altered sleeping 0 1 1  Tired, decreased energy 0 0 0  Change in appetite 0 0 0  Feeling bad or failure about yourself  0 0 0  Trouble concentrating 0 0 0  Moving slowly or fidgety/restless 0 0 0  Suicidal thoughts 0 0 0  PHQ-9 Score 0 1 1  Difficult doing work/chores Not difficult at all Not difficult at all Not difficult at all  Some recent data might be hidden   GAD 7 : Generalized Anxiety Score 10/29/2021 08/31/2021 05/14/2021 04/24/2020  Nervous, Anxious, on Edge 0 1 0 0  Control/stop worrying 0 0 0 0  Worry too much - different things 0 1 0 0  Trouble relaxing 0 0 0 0  Restless 0 0 0 0  Easily annoyed or irritable 0 1 0 0  Afraid - awful might happen 0 0 0 0  Total GAD 7 Score 0 3 0 0  Anxiety Difficulty Not difficult at all Not difficult at all Not difficult at all -   Cryotherapy Procedure:  Risks and benefits of procedure were reviewed with the patient.  Written consent obtained and scanned into the chart.  Lesion of concern was identified and located on Left upper back.  Liquid nitrogen was applied to area of concern and extending out 1 millimeters beyond the border of the lesion.  Treated area was allowed to come back to room temperature before treating it a second time.  Patient tolerated procedure well and there were no immediate complications.  Home care instructions were reviewed with the patient and a handout was provided.  Assessment/ Plan: 71 y.o. female   Blepharoptosis, acquired, bilateral - Plan: Ambulatory referral to Ophthalmology  Actinic  keratosis  Mixed hyperlipidemia - Plan: Lipid panel  Generalized anxiety disorder - Plan: mirtazapine (REMERON) 7.5 MG tablet  Sleep disorder - Plan: mirtazapine (REMERON) 7.5 MG tablet  I have placed a referral to ophthalmology for consideration of lid lift.  It sounds if she is having impairment in her peripheral vision is related to the droopiness of her eyelids  Actinic keratosis was treated today by cryo of the lesion.  No immediate complications.  She understands red flag signs and symptoms that would warrant reevaluation  Fasting lipid panel placed.  She has been intolerant to the Crestor daily so we will try spacing out to every other day; Rx has been updated  The mirtazapine is working really well for anxiety and sleep.  This is been renewed.  She will follow-up in 6 months with me, sooner if concerns arise  Orders Placed This Encounter  Procedures   Lipid panel    Standing Status:   Future    Standing Expiration Date:   10/29/2022   Ambulatory referral to Ophthalmology    Referral Priority:   Routine    Referral Type:   Consultation    Referral Reason:   Specialty Services Required    Requested Specialty:   Ophthalmology    Number of Visits Requested:   1   No orders of the defined types were placed in this encounter.    [name], DO [place] [phone]

## 2021-11-12 ENCOUNTER — Other Ambulatory Visit: Payer: Self-pay | Admitting: *Deleted

## 2021-11-12 DIAGNOSIS — G479 Sleep disorder, unspecified: Secondary | ICD-10-CM

## 2021-11-12 DIAGNOSIS — L509 Urticaria, unspecified: Secondary | ICD-10-CM

## 2021-11-12 DIAGNOSIS — F411 Generalized anxiety disorder: Secondary | ICD-10-CM

## 2021-11-12 MED ORDER — LEVOTHYROXINE SODIUM 75 MCG PO TABS: 75.0000 ug | ORAL_TABLET | Freq: Every day | ORAL | 3 refills | Status: DC

## 2021-11-12 MED ORDER — FAMOTIDINE 20 MG PO TABS: 20.0000 mg | ORAL_TABLET | Freq: Two times a day (BID) | ORAL | 1 refills | Status: DC

## 2021-11-12 MED ORDER — HYDROXYZINE HCL 25 MG PO TABS: 12.5000 mg | ORAL_TABLET | Freq: Three times a day (TID) | ORAL | 0 refills | Status: DC | PRN

## 2021-11-12 MED ORDER — MIRTAZAPINE 7.5 MG PO TABS: 7.5000 mg | ORAL_TABLET | Freq: Every day | ORAL | 3 refills | Status: DC

## 2021-11-12 MED ORDER — ROSUVASTATIN CALCIUM 40 MG PO TABS: 40.0000 mg | ORAL_TABLET | ORAL | 3 refills | Status: DC

## 2021-11-13 ENCOUNTER — Other Ambulatory Visit: Payer: Self-pay | Admitting: Family Medicine

## 2021-11-15 ENCOUNTER — Ambulatory Visit (INDEPENDENT_AMBULATORY_CARE_PROVIDER_SITE_OTHER): Payer: Medicare HMO | Admitting: *Deleted

## 2021-11-15 DIAGNOSIS — E538 Deficiency of other specified B group vitamins: Secondary | ICD-10-CM

## 2021-11-15 NOTE — Progress Notes (Signed)
Pt given B12 injection IM right deltoid and tolerated well. 

## 2021-11-28 ENCOUNTER — Ambulatory Visit: Payer: Medicare HMO | Admitting: Family Medicine

## 2021-11-29 DIAGNOSIS — H547 Unspecified visual loss: Secondary | ICD-10-CM | POA: Diagnosis not present

## 2021-11-29 DIAGNOSIS — Z01818 Encounter for other preprocedural examination: Secondary | ICD-10-CM | POA: Diagnosis not present

## 2021-11-29 DIAGNOSIS — H02831 Dermatochalasis of right upper eyelid: Secondary | ICD-10-CM | POA: Diagnosis not present

## 2021-11-29 DIAGNOSIS — H02834 Dermatochalasis of left upper eyelid: Secondary | ICD-10-CM | POA: Diagnosis not present

## 2021-11-29 DIAGNOSIS — H04203 Unspecified epiphora, bilateral lacrimal glands: Secondary | ICD-10-CM | POA: Diagnosis not present

## 2021-12-13 ENCOUNTER — Ambulatory Visit: Payer: Medicare HMO

## 2021-12-14 ENCOUNTER — Ambulatory Visit (INDEPENDENT_AMBULATORY_CARE_PROVIDER_SITE_OTHER): Payer: Medicare HMO

## 2021-12-14 DIAGNOSIS — E538 Deficiency of other specified B group vitamins: Secondary | ICD-10-CM

## 2021-12-14 NOTE — Progress Notes (Signed)
Pt given b12Inj L-deltoild 1041mcgIM  ?Pt tol well ?

## 2021-12-17 ENCOUNTER — Other Ambulatory Visit: Payer: Self-pay | Admitting: Family Medicine

## 2021-12-17 DIAGNOSIS — F411 Generalized anxiety disorder: Secondary | ICD-10-CM

## 2021-12-18 MED ORDER — HYDROXYZINE HCL 25 MG PO TABS: 12.5000 mg | ORAL_TABLET | Freq: Three times a day (TID) | ORAL | 0 refills | Status: DC | PRN

## 2021-12-19 DIAGNOSIS — H02834 Dermatochalasis of left upper eyelid: Secondary | ICD-10-CM | POA: Diagnosis not present

## 2021-12-19 DIAGNOSIS — H02831 Dermatochalasis of right upper eyelid: Secondary | ICD-10-CM | POA: Diagnosis not present

## 2021-12-27 ENCOUNTER — Other Ambulatory Visit: Payer: Self-pay | Admitting: Family Medicine

## 2021-12-31 ENCOUNTER — Encounter: Payer: Self-pay | Admitting: Family Medicine

## 2022-01-01 ENCOUNTER — Ambulatory Visit (INDEPENDENT_AMBULATORY_CARE_PROVIDER_SITE_OTHER): Payer: Medicare HMO | Admitting: Nurse Practitioner

## 2022-01-01 ENCOUNTER — Encounter: Payer: Self-pay | Admitting: Nurse Practitioner

## 2022-01-01 VITALS — BP 134/75 | HR 67 | Temp 98.8°F | Ht 63.0 in | Wt 146.0 lb

## 2022-01-01 DIAGNOSIS — J069 Acute upper respiratory infection, unspecified: Secondary | ICD-10-CM

## 2022-01-01 MED ORDER — DOXYCYCLINE HYCLATE 100 MG PO TABS: 100.0000 mg | ORAL_TABLET | Freq: Two times a day (BID) | ORAL | 0 refills | Status: DC

## 2022-01-01 MED ORDER — FLUTICASONE PROPIONATE 50 MCG/ACT NA SUSP: 2.0000 | Freq: Every day | NASAL | 6 refills | Status: DC

## 2022-01-01 NOTE — Patient Instructions (Signed)

## 2022-01-01 NOTE — Progress Notes (Signed)
? ?Acute Office Visit ? ?Subjective:  ? ? Patient ID: Jasmin Butler, female    DOB: [birth year], 71 y.o.   MRN: [id number] ? ?Chief Complaint  ?Patient presents with  ? chest congestion  ? ? ?Sore Throat  ?This is a new problem. The current episode started yesterday. The problem has been unchanged. Neither side of throat is experiencing more pain than the other. There has been no fever. The pain is at a severity of 2/10. The pain is mild. Associated symptoms include congestion, coughing and headaches. Pertinent negatives include no ear discharge or shortness of breath. She has had no exposure to strep. She has tried nothing for the symptoms.  ?URI  ?This is a new problem. The current episode started yesterday. The problem has been unchanged. There has been no fever. Associated symptoms include congestion, coughing, headaches and a sore throat. Pertinent negatives include no sinus pain. She has tried nothing for the symptoms. The treatment provided no relief.  ? ? ?Past Medical History:  ?Diagnosis Date  ? Allergy to alpha-gal   ? Anxiety   ? Cataract   ? Depression   ? "couple times/yr" (09/29/2014)  ? GERD (gastroesophageal reflux disease) 2005  ? Headache   ? "weekly" (09/29/2014)  ? History of hiatal hernia   ? Hyperlipidemia   ? IBS (irritable bowel syndrome)   ? Migraine   ? "2-3 times/yr" (09/29/2014)  ? Pernicious anemia   ? Pneumonia 1980's  ? "double"  ? Thyroid disease   ? Urticaria   ? ? ?Past Surgical History:  ?Procedure Laterality Date  ? ABDOMINAL HYSTERECTOMY  1985  ? CARDIAC CATHETERIZATION  ~ 2005  ? CATARACT EXTRACTION W/ INTRAOCULAR LENS  IMPLANT, BILATERAL Bilateral 2000's  ? CHOLECYSTECTOMY OPEN  ~ 1990  ? EYE SURGERY    ? TUBAL LIGATION  ~ 1983  ? ? ?Family History  ?Problem Relation Age of Onset  ? Leukemia Father   ? Cancer Father   ?     leukemia  ? Hypertension Mother   ? Stroke Mother   ? Arthritis Mother   ? Cancer Mother   ? Cancer Brother   ?     4 different cancers  ? Asthma Brother    ? Birth defects Son   ? ? ?Social History  ? ?Socioeconomic History  ? Marital status: Widowed  ?  Spouse name: Not on file  ? Number of children: 3  ? Years of education: GED  ? Highest education level: GED or equivalent  ?Occupational History  ? Occupation: Retired- Engineer, materials  ?Tobacco Use  ? Smoking status: Never  ? Smokeless tobacco: Never  ?Vaping Use  ? Vaping Use: Never used  ?Substance and Sexual Activity  ? Alcohol use: No  ? Drug use: Never  ? Sexual activity: Not Currently  ?Other Topics Concern  ? Not on file  ?Social History Narrative  ? 2 story home  ? Her son lives with her  ? Very independent  ? Does not believe in annual screenings  ? ?Social Determinants of Health  ? ?Financial Resource Strain: Low Risk   ? Difficulty of Paying Living Expenses: Not hard at all  ?Food Insecurity: No Food Insecurity  ? Worried About Charity fundraiser in the Last Year: Never true  ? Ran Out of Food in the Last Year: Never true  ?Transportation Needs: No Transportation Needs  ? Lack of Transportation (Medical): No  ? Lack of Transportation (Non-Medical): No  ?  Physical Activity: Sufficiently Active  ? Days of Exercise per Week: 5 days  ? Minutes of Exercise per Session: 30 min  ?Stress: No Stress Concern Present  ? Feeling of Stress : Not at all  ?Social Connections: Moderately Isolated  ? Frequency of Communication with Friends and Family: More than three times a week  ? Frequency of Social Gatherings with Friends and Family: More than three times a week  ? Attends Religious Services: More than 4 times per year  ? Active Member of Clubs or Organizations: No  ? Attends Archivist Meetings: Never  ? Marital Status: Widowed  ?Intimate Partner Violence: Not At Risk  ? Fear of Current or Ex-Partner: No  ? Emotionally Abused: No  ? Physically Abused: No  ? Sexually Abused: No  ? ? ?Outpatient Medications Prior to Visit  ?Medication Sig Dispense Refill  ? cyanocobalamin (,VITAMIN B-12,) 1000 MCG/ML injection  Inject 1,000 mcg into the muscle every 30 (thirty) days.     ? EPINEPHrine (EPIPEN 2-PAK) 0.3 mg/0.3 mL IJ SOAJ injection Inject 0.3 mg into the muscle as needed for anaphylaxis. 1 each 2  ? famotidine (PEPCID) 20 MG tablet Take 1 tablet (20 mg total) by mouth 2 (two) times daily. 180 tablet 1  ? hydrOXYzine (ATARAX) 25 MG tablet Take 0.5-1 tablets (12.5-25 mg total) by mouth every 8 (eight) hours as needed for anxiety. 270 tablet 0  ? levothyroxine (SYNTHROID) 75 MCG tablet Take 1 tablet (75 mcg total) by mouth daily. (Needs Labwork) 90 tablet 3  ? mirtazapine (REMERON) 7.5 MG tablet Take 1 tablet (7.5 mg total) by mouth at bedtime. 90 tablet 3  ? rosuvastatin (CRESTOR) 40 MG tablet Take 1 tablet (40 mg total) by mouth every other day. 90 tablet 3  ? ?Facility-Administered Medications Prior to Visit  ?Medication Dose Route Frequency Provider Last Rate Last Admin  ? cyanocobalamin ((VITAMIN B-12)) injection 1,000 mcg  1,000 mcg Intramuscular Q30 days Ronnie Doss M, DO   1,000 mcg at 12/14/21 1123  ? ? ?Allergies  ?Allergen Reactions  ? Penicillins Rash  ?  Did it involve swelling of the face/tongue/throat, SOB, or low BP? Yes ?Did it involve sudden or severe rash/hives, skin peeling, or any reaction on the inside of your mouth or nose? Unk ?Did you need to seek medical attention at a hospital or doctor's office? Yes ?When did it last happen? 30 years ago ?If all above answers are ?NO?, may proceed with cephalosporin use. ?  ? ? ?Review of Systems  ?Constitutional: Negative.   ?HENT:  Positive for congestion and sore throat. Negative for ear discharge and sinus pain.   ?Eyes: Negative.   ?Respiratory:  Positive for cough. Negative for shortness of breath.   ?Gastrointestinal: Negative.   ?Neurological:  Positive for headaches.  ?All other systems reviewed and are negative. ? ?   ?Objective:  ?  ?Physical Exam ?Vitals and nursing note reviewed.  ?Constitutional:   ?   Appearance: Normal appearance.  ?HENT:  ?    Head: Normocephalic.  ?   Right Ear: External ear normal.  ?   Left Ear: External ear normal.  ?   Nose: Congestion present.  ?   Mouth/Throat:  ?   Mouth: Mucous membranes are moist.  ?   Pharynx: Oropharynx is clear.  ?Eyes:  ?   Conjunctiva/sclera: Conjunctivae normal.  ?Cardiovascular:  ?   Rate and Rhythm: Normal rate and regular rhythm.  ?   Pulses: Normal pulses.  ?  Heart sounds: Normal heart sounds.  ?Abdominal:  ?   General: Bowel sounds are normal.  ?Musculoskeletal:     ?   General: Normal range of motion.  ?Skin: ?   General: Skin is warm.  ?Neurological:  ?   Mental Status: She is alert and oriented to person, place, and time.  ?Psychiatric:     ?   Mood and Affect: Mood normal.     ?   Behavior: Behavior normal.  ? ? ?BP 134/75   Pulse 67   Temp 98.8 ?F (37.1 ?C)   Ht $R'5\' 3"'ww$  (1.6 m)   Wt 146 lb (66.2 kg)   SpO2 95%   BMI 25.86 kg/m?  ?Wt Readings from Last 3 Encounters:  ?01/01/22 146 lb (66.2 kg)  ?10/29/21 143 lb 12.8 oz (65.2 kg)  ?08/31/21 140 lb (63.5 kg)  ? ? ?Health Maintenance Due  ?Topic Date Due  ? COVID-19 Vaccine (2 - Moderna risk series) 12/16/2019  ? Fecal DNA (Cologuard)  12/03/2021  ? ? ?There are no preventive care reminders to display for this patient. ? ? ?Lab Results  ?Component Value Date  ? TSH 1.820 10/26/2021  ? ?Lab Results  ?Component Value Date  ? WBC 5.5 10/26/2021  ? HGB 12.4 10/26/2021  ? HCT 38.3 10/26/2021  ? MCV 85 10/26/2021  ? PLT 254 10/26/2021  ? ?Lab Results  ?Component Value Date  ? NA 145 (H) 10/26/2021  ? K 4.3 10/26/2021  ? CO2 23 10/26/2021  ? GLUCOSE 97 10/26/2021  ? BUN 10 10/26/2021  ? CREATININE 0.79 10/26/2021  ? BILITOT 0.3 10/26/2021  ? ALKPHOS 70 10/26/2021  ? AST 27 10/26/2021  ? ALT 13 10/26/2021  ? PROT 6.9 10/26/2021  ? ALBUMIN 4.6 10/26/2021  ? CALCIUM 10.5 (H) 10/26/2021  ? ANIONGAP 11 06/07/2020  ? EGFR 79 10/26/2021  ? GFR 84.98 10/27/2014  ? ?Lab Results  ?Component Value Date  ? CHOL 178 10/26/2021  ? ?Lab Results  ?Component Value  Date  ? HDL 63 10/26/2021  ? ?Lab Results  ?Component Value Date  ? [place] 92 10/26/2021  ? ?Lab Results  ?Component Value Date  ? TRIG 132 10/26/2021  ? ?Lab Results  ?Component Value Date  ? CHOLHDL 2.8 01/

## 2022-01-05 ENCOUNTER — Encounter: Payer: Self-pay | Admitting: Family Medicine

## 2022-01-06 ENCOUNTER — Encounter: Payer: Self-pay | Admitting: Family Medicine

## 2022-01-14 ENCOUNTER — Ambulatory Visit (INDEPENDENT_AMBULATORY_CARE_PROVIDER_SITE_OTHER): Payer: Medicare HMO | Admitting: *Deleted

## 2022-01-14 DIAGNOSIS — E538 Deficiency of other specified B group vitamins: Secondary | ICD-10-CM | POA: Diagnosis not present

## 2022-01-21 ENCOUNTER — Encounter: Payer: Self-pay | Admitting: Family Medicine

## 2022-01-25 ENCOUNTER — Other Ambulatory Visit: Payer: Medicare HMO

## 2022-01-25 ENCOUNTER — Encounter: Payer: Self-pay | Admitting: Family Medicine

## 2022-01-25 DIAGNOSIS — E782 Mixed hyperlipidemia: Secondary | ICD-10-CM

## 2022-01-26 LAB — LIPID PANEL
Chol/HDL Ratio: 2.7 ratio (ref 0.0–4.4)
Cholesterol, Total: 150 mg/dL (ref 100–199)
HDL: 56 mg/dL (ref >39)
LDL Chol Calc (NIH): 76 mg/dL (ref 0–99)
Triglycerides: 100 mg/dL (ref 0–149)
VLDL Cholesterol Cal: 18 mg/dL (ref 5–40)

## 2022-01-26 LAB — PTH, INTACT AND CALCIUM
Calcium: 9.1 mg/dL (ref 8.7–10.3)
PTH: 30 pg/mL (ref 15–65)

## 2022-01-29 ENCOUNTER — Ambulatory Visit (INDEPENDENT_AMBULATORY_CARE_PROVIDER_SITE_OTHER): Payer: Medicare HMO | Admitting: Family Medicine

## 2022-01-29 ENCOUNTER — Encounter: Payer: Self-pay | Admitting: Family Medicine

## 2022-01-29 VITALS — BP 134/80 | HR 67 | Temp 97.4°F | Ht 63.0 in | Wt 146.4 lb

## 2022-01-29 DIAGNOSIS — E538 Deficiency of other specified B group vitamins: Secondary | ICD-10-CM

## 2022-01-29 DIAGNOSIS — Z1211 Encounter for screening for malignant neoplasm of colon: Secondary | ICD-10-CM | POA: Diagnosis not present

## 2022-01-29 DIAGNOSIS — E034 Atrophy of thyroid (acquired): Secondary | ICD-10-CM | POA: Diagnosis not present

## 2022-01-29 DIAGNOSIS — E782 Mixed hyperlipidemia: Secondary | ICD-10-CM | POA: Diagnosis not present

## 2022-01-29 NOTE — Progress Notes (Signed)
? ?Subjective: ?CC: Follow-up hypothyroidism, B12 deficiency ?PCP: [name], DO ?QBH:ALPF Jasmin Butler is a 71 y.o. female presenting to clinic today for: ? ?1.  Hypothyroidism ?Patient is compliant with thyroid meds.  No reports of tremor, difficulty swallowing, heart palpitations ? ?2.  B12 deficiency ?Continues to get B12 shots every month.  Not due until the first of next month.  No reports of falls or balance issues ? ?3.  Hyperlipidemia ?No chest pain, shortness of breath reported.  Compliant with Crestor.  Had fasting labs which showed well-controlled cholesterol ? ? ?ROS: Per HPI ? ?Allergies  ?Allergen Reactions  ? Penicillins Rash  ?  Did it involve swelling of the face/tongue/throat, SOB, or low BP? Yes ?Did it involve sudden or severe rash/hives, skin peeling, or any reaction on the inside of your mouth or nose? Unk ?Did you need to seek medical attention at a hospital or doctor's office? Yes ?When did it last happen? 30 years ago ?If all above answers are ?NO?, may proceed with cephalosporin use. ?  ? ?Past Medical History:  ?Diagnosis Date  ? Allergy to alpha-gal   ? Anxiety   ? Cataract   ? Depression   ? "couple times/yr" (09/29/2014)  ? GERD (gastroesophageal reflux disease) 2005  ? Headache   ? "weekly" (09/29/2014)  ? History of hiatal hernia   ? Hyperlipidemia   ? IBS (irritable bowel syndrome)   ? Migraine   ? "2-3 times/yr" (09/29/2014)  ? Pernicious anemia   ? Pneumonia 1980's  ? "double"  ? Thyroid disease   ? Urticaria   ? ? ?Current Outpatient Medications:  ?  cyanocobalamin (,VITAMIN B-12,) 1000 MCG/ML injection, Inject 1,000 mcg into the muscle every 30 (thirty) days. , Disp: , Rfl:  ?  EPINEPHrine (EPIPEN 2-PAK) 0.3 mg/0.3 mL IJ SOAJ injection, Inject 0.3 mg into the muscle as needed for anaphylaxis., Disp: 1 each, Rfl: 2 ?  famotidine (PEPCID) 20 MG tablet, Take 1 tablet (20 mg total) by mouth 2 (two) times daily., Disp: 180 tablet, Rfl: 1 ?  hydrOXYzine (ATARAX) 25 MG tablet,  Take 0.5-1 tablets (12.5-25 mg total) by mouth every 8 (eight) hours as needed for anxiety., Disp: 270 tablet, Rfl: 0 ?  levothyroxine (SYNTHROID) 75 MCG tablet, Take 1 tablet (75 mcg total) by mouth daily. (Needs Labwork), Disp: 90 tablet, Rfl: 3 ?  mirtazapine (REMERON) 7.5 MG tablet, Take 1 tablet (7.5 mg total) by mouth at bedtime., Disp: 90 tablet, Rfl: 3 ?  rosuvastatin (CRESTOR) 40 MG tablet, Take 1 tablet (40 mg total) by mouth every other day., Disp: 90 tablet, Rfl: 3 ? ?Current Facility-Administered Medications:  ?  cyanocobalamin ((VITAMIN B-12)) injection 1,000 mcg, 1,000 mcg, Intramuscular, Q30 days, Ronnie Doss M, DO, 1,000 mcg at 01/14/22 1116 ?Social History  ? ?Socioeconomic History  ? Marital status: Widowed  ?  Spouse name: Not on file  ? Number of children: 3  ? Years of education: GED  ? Highest education level: GED or equivalent  ?Occupational History  ? Occupation: Retired- Engineer, materials  ?Tobacco Use  ? Smoking status: Never  ? Smokeless tobacco: Never  ?Vaping Use  ? Vaping Use: Never used  ?Substance and Sexual Activity  ? Alcohol use: No  ? Drug use: Never  ? Sexual activity: Not Currently  ?Other Topics Concern  ? Not on file  ?Social History Narrative  ? 2 story home  ? Her son lives with her  ? Very independent  ?  Does not believe in annual screenings  ? ?Social Determinants of Health  ? ?Financial Resource Strain: Low Risk   ? Difficulty of Paying Living Expenses: Not hard at all  ?Food Insecurity: No Food Insecurity  ? Worried About Charity fundraiser in the Last Year: Never true  ? Ran Out of Food in the Last Year: Never true  ?Transportation Needs: No Transportation Needs  ? Lack of Transportation (Medical): No  ? Lack of Transportation (Non-Medical): No  ?Physical Activity: Sufficiently Active  ? Days of Exercise per Week: 5 days  ? Minutes of Exercise per Session: 30 min  ?Stress: No Stress Concern Present  ? Feeling of Stress : Not at all  ?Social Connections: Moderately  Isolated  ? Frequency of Communication with Friends and Family: More than three times a week  ? Frequency of Social Gatherings with Friends and Family: More than three times a week  ? Attends Religious Services: More than 4 times per year  ? Active Member of Clubs or Organizations: No  ? Attends Archivist Meetings: Never  ? Marital Status: Widowed  ?Intimate Partner Violence: Not At Risk  ? Fear of Current or Ex-Partner: No  ? Emotionally Abused: No  ? Physically Abused: No  ? Sexually Abused: No  ? ?Family History  ?Problem Relation Age of Onset  ? Leukemia Father   ? Cancer Father   ?     leukemia  ? Hypertension Mother   ? Stroke Mother   ? Arthritis Mother   ? Cancer Mother   ? Cancer Brother   ?     4 different cancers  ? Asthma Brother   ? Birth defects Son   ? ? ?Objective: ?Office vital signs reviewed. ?BP 134/80   Pulse 67   Temp (!) 97.4 ?F (36.3 ?C)   Ht '5\' 3"'$  (1.6 m)   Wt 146 lb 6.4 oz (66.4 kg)   SpO2 97%   BMI 25.93 kg/m?  ? ?Physical Examination:  ?General: Awake, alert, well nourished, No acute distress ?HEENT: No exophthalmos.  No goiter.  No thyroid mass ?Cardio: regular rate and rhythm, S1S2 heard, no murmurs appreciated ?Pulm: clear to auscultation bilaterally, no wheezes, rhonchi or rales; normal work of breathing on room air ?Neuro: no tremor ? ?Assessment/ Plan: ?71 y.o. female  ? ?B12 deficiency ? ?Mixed hyperlipidemia ? ?Colon cancer screening - Plan: Cologuard ? ?Hypothyroidism due to acquired atrophy of thyroid ? ?Up-to-date on B12 shots.  Not needing B12 level yet ? ?Cholesterol is well controlled and in fact better control than her previous checkup.  May follow-up in 6 months, sooner if concerns arise ? ?Cologuard ordered for colon cancer screening.  She is having no concerning symptoms or signs ? ?Not yet due for thyroid check.  She is asymptomatic from a thyroid standpoint.  Up-to-date on medication refills ? ?No orders of the defined types were placed in this  encounter. ? ?No orders of the defined types were placed in this encounter. ? ? ? ?[name], DO ?[place] ?[phone] ? ?

## 2022-02-04 DIAGNOSIS — Z1211 Encounter for screening for malignant neoplasm of colon: Secondary | ICD-10-CM | POA: Diagnosis not present

## 2022-02-09 ENCOUNTER — Encounter: Payer: Self-pay | Admitting: Family Medicine

## 2022-02-12 LAB — COLOGUARD: COLOGUARD: NEGATIVE

## 2022-02-13 ENCOUNTER — Ambulatory Visit (INDEPENDENT_AMBULATORY_CARE_PROVIDER_SITE_OTHER): Payer: Medicare HMO | Admitting: *Deleted

## 2022-02-13 DIAGNOSIS — Z23 Encounter for immunization: Secondary | ICD-10-CM

## 2022-02-13 DIAGNOSIS — E538 Deficiency of other specified B group vitamins: Secondary | ICD-10-CM | POA: Diagnosis not present

## 2022-02-24 ENCOUNTER — Other Ambulatory Visit: Payer: Self-pay | Admitting: Family Medicine

## 2022-03-15 ENCOUNTER — Ambulatory Visit (INDEPENDENT_AMBULATORY_CARE_PROVIDER_SITE_OTHER): Payer: Medicare HMO | Admitting: *Deleted

## 2022-03-15 DIAGNOSIS — E538 Deficiency of other specified B group vitamins: Secondary | ICD-10-CM | POA: Diagnosis not present

## 2022-04-15 ENCOUNTER — Ambulatory Visit (INDEPENDENT_AMBULATORY_CARE_PROVIDER_SITE_OTHER): Payer: Medicare HMO | Admitting: *Deleted

## 2022-04-15 DIAGNOSIS — E538 Deficiency of other specified B group vitamins: Secondary | ICD-10-CM | POA: Diagnosis not present

## 2022-05-16 ENCOUNTER — Ambulatory Visit (INDEPENDENT_AMBULATORY_CARE_PROVIDER_SITE_OTHER): Payer: Medicare HMO

## 2022-05-16 DIAGNOSIS — E538 Deficiency of other specified B group vitamins: Secondary | ICD-10-CM | POA: Diagnosis not present

## 2022-05-16 DIAGNOSIS — Z23 Encounter for immunization: Secondary | ICD-10-CM

## 2022-05-16 NOTE — Progress Notes (Signed)
Cyanocobalamin injection given to right deltoid, patient tolerated well.

## 2022-05-20 ENCOUNTER — Encounter: Payer: Self-pay | Admitting: Family Medicine

## 2022-05-21 ENCOUNTER — Encounter: Payer: Self-pay | Admitting: Family Medicine

## 2022-05-21 ENCOUNTER — Ambulatory Visit (INDEPENDENT_AMBULATORY_CARE_PROVIDER_SITE_OTHER): Payer: Medicare HMO | Admitting: Family Medicine

## 2022-05-21 DIAGNOSIS — M62830 Muscle spasm of back: Secondary | ICD-10-CM

## 2022-05-21 MED ORDER — CYCLOBENZAPRINE HCL 5 MG PO TABS: 5.0000 mg | ORAL_TABLET | Freq: Three times a day (TID) | ORAL | 1 refills | Status: DC | PRN

## 2022-05-21 NOTE — Telephone Encounter (Signed)
Ok to  put her on for a televisit today and I will send something in for her.

## 2022-05-21 NOTE — Progress Notes (Signed)
Telephone visit  Subjective: CC: Back pain PCP: [name], DO ELF:YBOF Jasmin Butler is a 71 y.o. female calls for telephone consult today. Patient provides verbal consent for consult held via phone.  Due to COVID-19 pandemic this visit was conducted virtually. This visit type was conducted due to national recommendations for restrictions regarding the COVID-19 Pandemic (e.g. social distancing, sheltering in place) in an effort to limit this patient's exposure and mitigate transmission in our community. All issues noted in this document were discussed and addressed.  A physical exam was not performed with this format.   Location of patient: home Location of provider: [facility] Others present for call: none  1. Back pain Patient reports that she pulled a muscle in her back on Saturday, she has been previously prescribed muscle relaxers but the other ones were out of date so she tossed them out a couple months ago. She reports low back pain that onset when she turned abruptly and then her back started spasming. No weakness, numbness/ tingling, no radiation legs.  No change in bowel, bladder function.  She is taking Aleve and biofreeze and that helped a little bit.  Using heating pad.    ROS: Per HPI  Allergies  Allergen Reactions   Penicillins Rash    Did it involve swelling of the face/tongue/throat, SOB, or low BP? Yes Did it involve sudden or severe rash/hives, skin peeling, or any reaction on the inside of your mouth or nose? Unk Did you need to seek medical attention at a hospital or doctor's office? Yes When did it last happen? 30 years ago If all above answers are "NO", may proceed with cephalosporin use.    Past Medical History:  Diagnosis Date   Allergy to alpha-gal    Anxiety    Cataract    Depression    "couple times/yr" (09/29/2014)   GERD (gastroesophageal reflux disease) 2005   Headache    "weekly" (09/29/2014)   History of hiatal hernia    Hyperlipidemia    IBS  (irritable bowel syndrome)    Migraine    "2-3 times/yr" (09/29/2014)   Pernicious anemia    Pneumonia 1980's   "double"   Thyroid disease    Urticaria     Current Outpatient Medications:    cyanocobalamin (,VITAMIN B-12,) 1000 MCG/ML injection, Inject 1,000 mcg into the muscle every 30 (thirty) days. , Disp: , Rfl:    EPINEPHrine (EPIPEN 2-PAK) 0.3 mg/0.3 mL IJ SOAJ injection, Inject 0.3 mg into the muscle as needed for anaphylaxis., Disp: 1 each, Rfl: 2   famotidine (PEPCID) 20 MG tablet, Take 1 tablet (20 mg total) by mouth 2 (two) times daily., Disp: 180 tablet, Rfl: 1   hydrOXYzine (ATARAX) 25 MG tablet, Take 0.5-1 tablets (12.5-25 mg total) by mouth every 8 (eight) hours as needed for anxiety., Disp: 270 tablet, Rfl: 0   levothyroxine (SYNTHROID) 75 MCG tablet, Take 1 tablet (75 mcg total) by mouth daily., Disp: 90 tablet, Rfl: 1   mirtazapine (REMERON) 7.5 MG tablet, Take 1 tablet (7.5 mg total) by mouth at bedtime., Disp: 90 tablet, Rfl: 3   rosuvastatin (CRESTOR) 40 MG tablet, Take 1 tablet (40 mg total) by mouth every other day., Disp: 90 tablet, Rfl: 3  Current Facility-Administered Medications:    cyanocobalamin ((VITAMIN B-12)) injection 1,000 mcg, 1,000 mcg, Intramuscular, Q30 days, Nizhoni Parlow M, DO, 1,000 mcg at 05/16/22 1017  Assessment/ Plan: 71 y.o. female   Spasm of muscle of lower back - Plan: cyclobenzaprine (  FLEXERIL) 5 MG tablet  Flexeril renewed.  Discussed home care instructions and reasons for reevaluation.  No red flag signs or symptoms to suggest acute spinal pathology.  Okay to continue heat, OTC analgesics including muscle rubs as needed.  Follow-up if symptoms or not improving or if having any significant sedation or side effects from the Flexeril.  Caution sedation, dizziness, falls  Start time: 1:08pm (LVM), 1:25pm End time: 1:30pm  Total time spent on patient care (including telephone call/ virtual visit): 5 minutes  [place],  [place] [phone]

## 2022-06-10 ENCOUNTER — Encounter: Payer: Self-pay | Admitting: Family Medicine

## 2022-06-14 ENCOUNTER — Ambulatory Visit (INDEPENDENT_AMBULATORY_CARE_PROVIDER_SITE_OTHER): Payer: Medicare HMO | Admitting: Family Medicine

## 2022-06-14 ENCOUNTER — Ambulatory Visit: Payer: Medicare HMO

## 2022-06-14 ENCOUNTER — Encounter: Payer: Self-pay | Admitting: Family Medicine

## 2022-06-14 VITALS — BP 157/82 | HR 61 | Temp 97.3°F | Ht 63.0 in | Wt 145.4 lb

## 2022-06-14 DIAGNOSIS — R5382 Chronic fatigue, unspecified: Secondary | ICD-10-CM

## 2022-06-14 DIAGNOSIS — R6889 Other general symptoms and signs: Secondary | ICD-10-CM | POA: Diagnosis not present

## 2022-06-14 DIAGNOSIS — R739 Hyperglycemia, unspecified: Secondary | ICD-10-CM | POA: Diagnosis not present

## 2022-06-14 DIAGNOSIS — E538 Deficiency of other specified B group vitamins: Secondary | ICD-10-CM

## 2022-06-14 DIAGNOSIS — R030 Elevated blood-pressure reading, without diagnosis of hypertension: Secondary | ICD-10-CM

## 2022-06-14 DIAGNOSIS — K581 Irritable bowel syndrome with constipation: Secondary | ICD-10-CM | POA: Diagnosis not present

## 2022-06-14 LAB — BAYER DCA HB A1C WAIVED: HB A1C (BAYER DCA - WAIVED): 5.9 % — ABNORMAL HIGH (ref 4.8–5.6)

## 2022-06-14 MED ORDER — LINACLOTIDE 72 MCG PO CAPS: 72.0000 ug | ORAL_CAPSULE | Freq: Every day | ORAL | 0 refills | Status: DC

## 2022-06-14 MED ORDER — TRULANCE 3 MG PO TABS: 1.0000 | ORAL_TABLET | Freq: Every day | ORAL | 0 refills | Status: DC

## 2022-06-14 NOTE — Patient Instructions (Addendum)
Call and let me know what dose of Linzess works for you and I will send to pharmacy.  Chronic Fatigue Syndrome Chronic fatigue syndrome (CFS) is a condition that causes extreme tiredness (fatigue). This condition is also known as myalgic encephalomyelitis (ME). The fatigue in CFS does not improve with rest, and it gets worse with physical or mental activity. Several other symptoms may occur along with fatigue. Symptoms may come and go, but they generally last for months. Sometimes, CFS gets better over time. In other cases, it can be a lifelong condition. There is no cure, but there are many possible treatments. You will need to work with your health care providers to find a treatment plan that works best for you. What are the causes? The cause of this condition is not known. CFS may be caused by a combination of things. Possible causes include: An infection. An abnormal body defense system (abnormal immune system). Changes in how your body produces energy. Genetic factors. Physical or emotional stress. Having a poor diet. What increases the risk? You are more likely to develop this condition if: You are female. You are a young adult or middle-aged adult. You have a family history of CFS. What are the signs or symptoms? The main symptom of this condition is fatigue that is severe enough to interfere with day-to-day activities. This fatigue does not get better with rest, and it gets worse with physical or mental activity. There are eight other major symptoms of CFS: Discomfort and lack of energy (malaise) that lasts more than 24 hours after physical activity. Sleep that does not relieve fatigue (unrefreshing sleep). Short-term memory loss or confusion. Joint pain without redness or swelling. Muscle aches. Headaches. Painful and swollen glands (lymph nodes) in the neck or under the arms. Sore throat. Other symptoms can include: Cramps in the abdomen, constipation, or diarrhea (irritable  bowel syndrome). Chills and night sweats. Vision changes. Dizziness and mental confusion (brain fog). Clumsiness. Sensitivity to food, noise, or odors. Mood swings, depression, or anxiety attacks. How is this diagnosed? There are no tests that can diagnose this condition. Your health care provider will make the diagnosis based on: Your symptoms and medical history. A physical exam and a mental health exam. Tests to rule out other conditions. It is important to make sure that your symptoms are not caused by another medical condition. Tests may include lab tests or X-rays. For your health care provider to diagnose CFS: You must have had fatigue for at least 6 months in a row. Fatigue must be your first symptom, and it must be severe enough to interfere with day-to-day activities. You must also have at least four of the eight other major symptoms of CFS. There must be no other cause found for the fatigue. How is this treated? There is no cure for CFS. The condition affects everyone differently. You will need to work with your team of health care providers to find the best treatments for your symptoms. Your team may include your primary care provider, physical and exercise therapists, and mental health therapists. Treatment may include: Having a regular bedtime routine to help improve your sleep. Avoiding caffeine and alcohol. Doing light exercise and stretching during the day. You may also want to try movement exercises, such as yoga or tai chi. Taking medicines to help you sleep or to relieve joint or muscle pain. Learning and practicing relaxation techniques, such as deep breathing and muscle relaxation. Using memory aids or doing puzzles to improve memory and  concentration. Getting care for your body and mental well-being, such as: Seeing a mental health therapist to evaluate and treat depression, if necessary. Cognitive behavioral therapy (CBT). This therapy changes the way you think or  act in response to the fatigue. This may help improve how you feel. Trying massage therapy and acupuncture. Follow these instructions at home: Eating and drinking  Avoid caffeine and alcohol. Avoid heavy meals in the evening. Eat a healthy diet that includes foods such as vegetables, fruits, fish, and lean meats. Activity Rest as told by your health care provider. Avoid fatigue by pacing yourself during the day and getting enough sleep at night. Exercise as told by your health care provider. Go to bed and get up at the same time every day. Lifestyle Ask your health care provider whether you should keep a journal. Your health care provider will tell you what information to write in the journal. This may include when you have fatigue and how medicines and other behaviors or treatments help to reduce the fatigue. Consider joining a CFS support group. Avoid stress, and use stress-reducing techniques that you learn in therapy. General instructions Take over-the-counter and prescription medicines only as told by your health care provider. Do not use herbal or dietary supplements unless they are approved by your health care provider. Maintain a healthy weight. Keep all follow-up visits. This is important. Where to find more information Get more information or find a support group near you at one of these links: American Myalgic Encephalomyelitis and Chronic Fatigue Syndrome Society: ammes.org Centers for Disease Control and Prevention: http://www.wolf.info/ Contact a health care provider if: Your symptoms do not get better or they get worse. You feel angry, guilty, anxious, or depressed. Get help right away if: You have thoughts about hurting yourself or others. Get help right away if you feel like you may hurt yourself or others, or have thoughts about taking your own life. Go to your nearest emergency room or: Call 911. Call the [place] at [phone] or 988. This is  open 24 hours a day. Text the Crisis Text Line at (727)413-6747. Summary Chronic fatigue syndrome (CFS) is a condition that causes extreme tiredness (fatigue). This fatigue does not improve with rest, and it gets worse with physical or mental activity. There is no cure for CFS. The condition affects everyone differently. You will need to work with your team of health care providers to find the best treatments for your symptoms. Avoid stress, and use stress-reducing techniques that you learn in therapy. Contact a health care provider if your symptoms do not get better or they get worse. This information is not intended to replace advice given to you by your health care provider. Make sure you discuss any questions you have with your health care provider. Document Revised: 07/23/2021 Document Reviewed: 07/23/2021 Elsevier Patient Education  Jasmin Butler.

## 2022-06-14 NOTE — Progress Notes (Signed)
Subjective: CC: Fatigue PCP: Jasmin Norlander, DO LGX:QJJH Jasmin Butler is a 71 y.o. female presenting to clinic today for:  1.  Fatigue Patient reports that she has had about a 10-year history of intermittent fatigue spells.  These occur at random roughly 2-3 times per year.  Her previous PCP ran multiple labs on her but could really never find an etiology of the symptoms.  She does not report any infectious symptoms.  She suffers from hypothyroidism but is compliant with her thyroid medication.  No blood loss reported.  She gets B12 injections monthly for pernicious anemia.  2. IBS constipation she has had issues with irritable bowel syndrome causing constipation and was previously on Linzess which was helpful.  She would like to try and get this medication again if possible.  Symptoms are refractory to enemas, MiraLAX and stool softeners.  No blood in stool.   ROS: Per HPI  Allergies  Allergen Reactions   Penicillins Rash    Did it involve swelling of the face/tongue/throat, SOB, or low BP? Yes Did it involve sudden or severe rash/hives, skin peeling, or any reaction on the inside of your mouth or nose? Unk Did you need to seek medical attention at a hospital or doctor's office? Yes When did it last happen? 30 years ago If all above answers are "NO", may proceed with cephalosporin use.    Past Medical History:  Diagnosis Date   Allergy to alpha-gal    Anxiety    Cataract    Depression    "couple times/yr" (09/29/2014)   GERD (gastroesophageal reflux disease) 2005   Headache    "weekly" (09/29/2014)   History of hiatal hernia    Hyperlipidemia    IBS (irritable bowel syndrome)    Migraine    "2-3 times/yr" (09/29/2014)   Pernicious anemia    Pneumonia 1980's   "double"   Thyroid disease    Urticaria     Current Outpatient Medications:    cyanocobalamin (,VITAMIN B-12,) 1000 MCG/ML injection, Inject 1,000 mcg into the muscle every 30 (thirty) days. , Disp: , Rfl:     cyclobenzaprine (FLEXERIL) 5 MG tablet, Take 1 tablet (5 mg total) by mouth 3 (three) times daily as needed for muscle spasms., Disp: 30 tablet, Rfl: 1   EPINEPHrine (EPIPEN 2-PAK) 0.3 mg/0.3 mL IJ SOAJ injection, Inject 0.3 mg into the muscle as needed for anaphylaxis., Disp: 1 each, Rfl: 2   famotidine (PEPCID) 20 MG tablet, Take 1 tablet (20 mg total) by mouth 2 (two) times daily., Disp: 180 tablet, Rfl: 1   hydrOXYzine (ATARAX) 25 MG tablet, Take 0.5-1 tablets (12.5-25 mg total) by mouth every 8 (eight) hours as needed for anxiety., Disp: 270 tablet, Rfl: 0   levothyroxine (SYNTHROID) 75 MCG tablet, Take 1 tablet (75 mcg total) by mouth daily., Disp: 90 tablet, Rfl: 1   mirtazapine (REMERON) 7.5 MG tablet, Take 1 tablet (7.5 mg total) by mouth at bedtime., Disp: 90 tablet, Rfl: 3   rosuvastatin (CRESTOR) 40 MG tablet, Take 1 tablet (40 mg total) by mouth every other day., Disp: 90 tablet, Rfl: 3  Current Facility-Administered Medications:    cyanocobalamin ((VITAMIN B-12)) injection 1,000 mcg, 1,000 mcg, Intramuscular, Q30 days, [name], DO, 1,000 mcg at 06/14/22 1105 Social History   Socioeconomic History   Marital status: Widowed    Spouse name: Not on file   Number of children: 3   Years of education: GED   Highest education level: GED or  equivalent  Occupational History   Occupation: RetiredProduct manager  Tobacco Use   Smoking status: Never   Smokeless tobacco: Never  Vaping Use   Vaping Use: Never used  Substance and Sexual Activity   Alcohol use: No   Drug use: Never   Sexual activity: Not Currently  Other Topics Concern   Not on file  Social History Narrative   2 story home   Her son lives with her   Very independent   Does not believe in annual screenings   Social Determinants of Health   Financial Resource Strain: [place]  (08/31/2021)   Overall Financial Resource Strain (CARDIA)    Difficulty of Paying Living Expenses: Not hard at all  Food  Insecurity: No Food Insecurity (08/31/2021)   Hunger Vital Sign    Worried About Running Out of Food in the Last Year: Never true    [place] in the Last Year: Never true  Transportation Needs: No Transportation Needs (08/31/2021)   PRAPARE - Hydrologist (Medical): No    Lack of Transportation (Non-Medical): No  Physical Activity: Sufficiently Active (08/31/2021)   Exercise Vital Sign    Days of Exercise per Week: 5 days    Minutes of Exercise per Session: 30 min  Stress: No Stress Concern Present (08/31/2021)   [place]    Feeling of Stress : Not at all  Social Connections: Moderately Isolated (08/31/2021)   Social Connection and Isolation Panel [NHANES]    Frequency of Communication with Friends and Family: More than three times a week    Frequency of Social Gatherings with Friends and Family: More than three times a week    Attends Religious Services: More than 4 times per year    Active Member of [employer] or Organizations: No    Attends Archivist Meetings: Never    Marital Status: Widowed  Intimate Partner Violence: Not At Risk (08/31/2021)   Humiliation, Afraid, Rape, and Kick questionnaire    Fear of Current or Ex-Partner: No    Emotionally Abused: No    Physically Abused: No    Sexually Abused: No   Family History  Problem Relation Age of Onset   Leukemia Father    Cancer Father        leukemia   Hypertension Mother    Stroke Mother    Arthritis Mother    Cancer Mother    Cancer Brother        4 different cancers   Asthma Brother    Birth defects Son     Objective: Office vital signs reviewed. BP (!) 157/82   Pulse 61   Temp (!) 97.3 F (36.3 C)   Ht '5\' 3"'$  (1.6 m)   Wt 145 lb 6.4 oz (66 kg)   SpO2 98%   BMI 25.76 kg/m   Physical Examination:  General: Awake, alert, well nourished, No acute distress HEENT: No exophthalmos.  No goiter Cardio:  regular rate and rhythm, S1S2 heard, no murmurs appreciated Pulm: clear to auscultation bilaterally, no wheezes, rhonchi or rales; normal work of breathing on room air MSK: No gross joint deformity or erythema or warmth.  Ambulating independently Neuro: No tremor  Assessment/ Plan: 71 y.o. female   Chronic fatigue - Plan: ANA w/Reflex if Positive, C-reactive protein, Sedimentation Rate, TSH, T4, Free, CBC, VITAMIN D 25 Hydroxy (Vit-D Deficiency, Fractures), Bayer DCA Hb A1c Waived  Irritable bowel  syndrome with constipation - Plan: Plecanatide (TRULANCE) 3 MG TABS, DISCONTINUED: linaclotide (LINZESS) 72 MCG capsule  Vitamin B12 deficiency  Elevated blood pressure reading without diagnosis of hypertension  We will evaluate for any autoimmune component given known pernicious anemia and hypothyroidism.  Check metabolic labs.  Trulance samples provided today for IBS constipation.  She will let me know if this is helpful and we will proceed with prescription.  Otherwise we will start with Linzess 72 mcg and advance as needed  B12 injection was administered today so B12 level was not obtained  Advised to follow-up with nurse in 2 weeks for blood pressure recheck as they repeat remained out of range.  May need to consider starting something like Norvasc  No orders of the defined types were placed in this encounter.  No orders of the defined types were placed in this encounter.    Jasmin Norlander, DO [place] [phone]

## 2022-06-15 LAB — ANA W/REFLEX IF POSITIVE
Anti JO-1: <0.2 AI (ref 0.0–0.9)
Anti Nuclear Antibody (ANA): POSITIVE — AB
Centromere Ab Screen: <0.2 AI (ref 0.0–0.9)
Chromatin Ab SerPl-aCnc: <0.2 AI (ref 0.0–0.9)
ENA RNP Ab: <0.2 AI (ref 0.0–0.9)
ENA SM Ab Ser-aCnc: <0.2 AI (ref 0.0–0.9)
ENA SSA (RO) Ab: 0.2 AI (ref 0.0–0.9)
ENA SSB (LA) Ab: <0.2 AI (ref 0.0–0.9)
Scleroderma (Scl-70) (ENA) Antibody, IgG: 5.4 AI — ABNORMAL HIGH (ref 0.0–0.9)
dsDNA Ab: <1 IU/mL (ref 0–9)

## 2022-06-15 LAB — SEDIMENTATION RATE: Sed Rate: 13 mm/hr (ref 0–40)

## 2022-06-15 LAB — CBC
Hematocrit: 40.1 % (ref 34.0–46.6)
Hemoglobin: 12.8 g/dL (ref 11.1–15.9)
MCH: 26.8 pg (ref 26.6–33.0)
MCHC: 31.9 g/dL (ref 31.5–35.7)
MCV: 84 fL (ref 79–97)
Platelets: 249 10*3/uL (ref 150–450)
RBC: 4.78 x10E6/uL (ref 3.77–5.28)
RDW: 13.6 % (ref 11.7–15.4)
WBC: 5.9 10*3/uL (ref 3.4–10.8)

## 2022-06-15 LAB — VITAMIN D 25 HYDROXY (VIT D DEFICIENCY, FRACTURES): Vit D, 25-Hydroxy: 27.7 ng/mL — ABNORMAL LOW (ref 30.0–100.0)

## 2022-06-15 LAB — TSH: TSH: 0.827 u[IU]/mL (ref 0.450–4.500)

## 2022-06-15 LAB — C-REACTIVE PROTEIN: CRP: 2 mg/L (ref 0–10)

## 2022-06-15 LAB — T4, FREE: Free T4: 1.36 ng/dL (ref 0.82–1.77)

## 2022-06-16 ENCOUNTER — Encounter: Payer: Self-pay | Admitting: Family Medicine

## 2022-06-18 ENCOUNTER — Other Ambulatory Visit: Payer: Self-pay | Admitting: Family Medicine

## 2022-06-18 DIAGNOSIS — K581 Irritable bowel syndrome with constipation: Secondary | ICD-10-CM

## 2022-06-18 DIAGNOSIS — R768 Other specified abnormal immunological findings in serum: Secondary | ICD-10-CM

## 2022-06-18 MED ORDER — TRULANCE 3 MG PO TABS: 1.0000 | ORAL_TABLET | Freq: Every day | ORAL | 3 refills | Status: DC

## 2022-06-19 ENCOUNTER — Telehealth: Payer: Self-pay | Admitting: Family Medicine

## 2022-06-19 NOTE — Telephone Encounter (Signed)
Referral placed yesterday and looks like it is in review with Dr Estanislado Pandy in [facility]

## 2022-06-19 NOTE — Telephone Encounter (Signed)
Gave patient lab results and answered questions. Patient would like to go to [facility] for rheumatology. Advised patient referral would be placed and we would contact her about the appt

## 2022-06-21 ENCOUNTER — Encounter: Payer: Self-pay | Admitting: Family Medicine

## 2022-06-24 ENCOUNTER — Encounter: Payer: Self-pay | Admitting: Family Medicine

## 2022-06-24 NOTE — Telephone Encounter (Signed)
Just FYI if [facility] is sooner, she will go there

## 2022-06-24 NOTE — Telephone Encounter (Signed)
Are there any sooner appts?

## 2022-06-28 ENCOUNTER — Ambulatory Visit: Payer: Medicare HMO

## 2022-07-05 ENCOUNTER — Encounter: Payer: Self-pay | Admitting: Nurse Practitioner

## 2022-07-05 ENCOUNTER — Ambulatory Visit (INDEPENDENT_AMBULATORY_CARE_PROVIDER_SITE_OTHER): Payer: Medicare HMO | Admitting: Nurse Practitioner

## 2022-07-05 VITALS — BP 152/81 | HR 65 | Temp 97.9°F | Resp 20 | Ht 63.0 in | Wt 144.0 lb

## 2022-07-05 DIAGNOSIS — R768 Other specified abnormal immunological findings in serum: Secondary | ICD-10-CM | POA: Diagnosis not present

## 2022-07-05 DIAGNOSIS — M7989 Other specified soft tissue disorders: Secondary | ICD-10-CM | POA: Diagnosis not present

## 2022-07-05 MED ORDER — PREDNISONE 20 MG PO TABS: 40.0000 mg | ORAL_TABLET | Freq: Every day | ORAL | 0 refills | Status: AC

## 2022-07-05 NOTE — Patient Instructions (Signed)
Antinuclear Antibody Test Why am I having this test? This is a test that is used to help diagnose systemic lupus erythematosus (SLE) and other autoimmune diseases. An autoimmune disease is a disease in which the body's own defense system (immune system) attacks its organs. What is being tested? This test checks for antinuclear antibodies (ANA) in the blood. The presence of ANA is associated with several autoimmune diseases. It is seen in almost all people with lupus. What kind of sample is taken?  A blood sample is required for this test. It is usually collected by inserting a needle into a blood vessel. How are the results reported? Your test results will be reported as either positive or negative. What do the results mean? A positive test result may mean that you have: Lupus. Other autoimmune diseases, such as rheumatoid arthritis, scleroderma, or Sjgren syndrome. Talk with your health care provider about what your results mean. In some cases, your health care provider may do more testing to confirm the results. More testing may be done because other conditions can sometimes cause a positive result, such as: Liver dysfunction. Myasthenia gravis. Infectious mononucleosis. Questions to ask your health care provider Ask your health care provider, or the department that is doing the test: When will my results be ready? How will I get my results? What are my treatment options? What other tests do I need? What are my next steps? Summary This is a test that is used to help diagnose systemic lupus erythematosus (SLE) and other autoimmune diseases. An autoimmune disease is a disease in which the body's own defense system (immune system) attacks the body. This test checks for antinuclear antibodies (ANA) in the blood. The presence of ANA is associated with several autoimmune diseases. It is seen in almost all people with lupus. Your test results will be reported as either positive or negative.  Talk with your health care provider about what your results mean. This information is not intended to replace advice given to you by your health care provider. Make sure you discuss any questions you have with your health care provider. Document Revised: 06/03/2021 Document Reviewed: 06/03/2021 Elsevier Patient Education  [place].

## 2022-07-05 NOTE — Progress Notes (Signed)
   Subjective:    Patient ID: Jasmin Butler, female    DOB: 05/05/1951, 71 y.o.   MRN: [id number]  Chief Complaint: Right arm swollen and numb   HPI Patient comes in today c/o right arm being numb and swollen. She says 10 years ago, she had a cyst along right elbow. They told her to not be alarmed unless she developed numbness. Yesterday her right arm was numb all the way down to her fingers. Was better when she woke up this morning , but as soon as she started using her arm it started going numb again.    Review of Systems  Constitutional:  Negative for diaphoresis.  Eyes:  Negative for pain.  Respiratory:  Negative for shortness of breath.   Cardiovascular:  Negative for chest pain, palpitations and leg swelling.  Gastrointestinal:  Negative for abdominal pain.  Endocrine: Negative for polydipsia.  Skin:  Negative for rash.  Neurological:  Negative for dizziness, weakness and headaches.  Hematological:  Does not bruise/bleed easily.  All other systems reviewed and are negative.      Objective:   Physical Exam Vitals reviewed.  Constitutional:      Appearance: Normal appearance.  Cardiovascular:     Rate and Rhythm: Normal rate and regular rhythm.     Heart sounds: Normal heart sounds.  Pulmonary:     Effort: Pulmonary effort is normal.     Breath sounds: Normal breath sounds.  Skin:    General: Skin is warm.  Neurological:     General: No focal deficit present.     Mental Status: She is alert and oriented to person, place, and time.  Psychiatric:        Mood and Affect: Mood normal.    BP (!) 152/81   Pulse 65   Temp 97.9 F (36.6 C) (Temporal)   Resp 20   Ht '5\' 3"'$  (1.6 m)   Wt 144 lb (65.3 kg)   SpO2 97%   BMI 25.51 kg/m         Assessment & Plan:   Jasmin Butler in today with chief complaint of Right arm swollen and numb   1. Cyst of soft tissue Will call after results are back - [place] RT LOWER EXTREM LTD SOFT TISSUE NON VASCULAR; Future -  predniSONE (DELTASONE) 20 MG tablet; Take 2 tablets (40 mg total) by mouth daily with breakfast for 5 days. 2 po daily for 5 days  Dispense: 10 tablet; Refill: 0  2. ANA positive Redidr eferral to another rheumatology office to see if can be seen sooner - Ambulatory referral to Rheumatology    The above assessment and management plan was discussed with the patient. The patient verbalized understanding of and has agreed to the management plan. Patient is aware to call the clinic if symptoms persist or worsen. Patient is aware when to return to the clinic for a follow-up visit. Patient educated on when it is appropriate to go to the emergency department.   Laiah-Margaret Hassell Done, FNP

## 2022-07-08 ENCOUNTER — Other Ambulatory Visit: Payer: Self-pay | Admitting: Family Medicine

## 2022-07-12 ENCOUNTER — Ambulatory Visit (HOSPITAL_COMMUNITY)
Admission: RE | Admit: 2022-07-12 | Discharge: 2022-07-12 | Disposition: A | Discharge status: Home / Self Care | Payer: Medicare HMO | Source: Ambulatory Visit | Attending: Nurse Practitioner | Admitting: Nurse Practitioner

## 2022-07-12 DIAGNOSIS — R2231 Localized swelling, mass and lump, right upper limb: Secondary | ICD-10-CM | POA: Diagnosis not present

## 2022-07-12 DIAGNOSIS — M7989 Other specified soft tissue disorders: Secondary | ICD-10-CM | POA: Diagnosis not present

## 2022-07-15 ENCOUNTER — Other Ambulatory Visit: Payer: Self-pay | Admitting: Nurse Practitioner

## 2022-07-15 ENCOUNTER — Ambulatory Visit: Payer: Medicare HMO

## 2022-07-15 DIAGNOSIS — M7989 Other specified soft tissue disorders: Secondary | ICD-10-CM

## 2022-07-16 ENCOUNTER — Ambulatory Visit (INDEPENDENT_AMBULATORY_CARE_PROVIDER_SITE_OTHER): Payer: Medicare HMO | Admitting: *Deleted

## 2022-07-16 DIAGNOSIS — E538 Deficiency of other specified B group vitamins: Secondary | ICD-10-CM

## 2022-07-16 NOTE — Progress Notes (Signed)
Vitamin b12 injection given and patient tolerated well.  

## 2022-07-19 DIAGNOSIS — E663 Overweight: Secondary | ICD-10-CM | POA: Diagnosis not present

## 2022-07-19 DIAGNOSIS — R202 Paresthesia of skin: Secondary | ICD-10-CM | POA: Diagnosis not present

## 2022-07-19 DIAGNOSIS — Z6825 Body mass index (BMI) 25.0-25.9, adult: Secondary | ICD-10-CM | POA: Diagnosis not present

## 2022-07-19 DIAGNOSIS — D1721 Benign lipomatous neoplasm of skin and subcutaneous tissue of right arm: Secondary | ICD-10-CM | POA: Diagnosis not present

## 2022-07-19 DIAGNOSIS — R5383 Other fatigue: Secondary | ICD-10-CM | POA: Diagnosis not present

## 2022-07-19 DIAGNOSIS — R768 Other specified abnormal immunological findings in serum: Secondary | ICD-10-CM | POA: Diagnosis not present

## 2022-07-31 ENCOUNTER — Ambulatory Visit: Payer: Medicare HMO | Admitting: Family Medicine

## 2022-08-12 ENCOUNTER — Encounter: Payer: Self-pay | Admitting: Nurse Practitioner

## 2022-08-12 ENCOUNTER — Ambulatory Visit (INDEPENDENT_AMBULATORY_CARE_PROVIDER_SITE_OTHER): Payer: Medicare HMO | Admitting: Nurse Practitioner

## 2022-08-12 VITALS — BP 144/90 | HR 77 | Temp 97.1°F | Resp 20 | Ht 63.0 in | Wt 146.0 lb

## 2022-08-12 DIAGNOSIS — M25511 Pain in right shoulder: Secondary | ICD-10-CM | POA: Diagnosis not present

## 2022-08-12 MED ORDER — PREDNISONE 20 MG PO TABS: 40.0000 mg | ORAL_TABLET | Freq: Every day | ORAL | 0 refills | Status: AC

## 2022-08-12 NOTE — Progress Notes (Signed)
   Subjective:    Patient ID: Jasmin Butler, female    DOB: 04-May-1951, 71 y.o.   MRN: [id number]   Chief Complaint: Shoulder Pain (Right shoulder for 4 days. No injury. Woke up like this)   Shoulder Pain  The pain is present in the right shoulder. This is a new problem. The current episode started in the past 7 days. There has been no history of extremity trauma. The problem occurs intermittently. The problem has been waxing and waning. The quality of the pain is described as dull. The pain is at a severity of 8/10. The pain is moderate. The symptoms are aggravated by activity. She has tried NSAIDS for the symptoms. The treatment provided mild relief. Family history does not include gout or rheumatoid arthritis.       Review of Systems  Musculoskeletal:  Positive for arthralgias (right shoulder pain).       Objective:   Physical Exam Constitutional:      Appearance: Normal appearance.  Cardiovascular:     Rate and Rhythm: Normal rate and regular rhythm.  Pulmonary:     Effort: Pulmonary effort is normal.     Breath sounds: Normal breath sounds.  Musculoskeletal:     Comments: FROM of right shoulder with pain on abduction and internal rotation Grips equal bil No point tnederness  Skin:    General: Skin is warm.  Neurological:     General: No focal deficit present.     Mental Status: She is alert and oriented to person, place, and time.     BP (!) 144/90   Pulse 77   Temp (!) 97.1 F (36.2 C) (Temporal)   Resp 20   Ht '5\' 3"'$  (1.6 m)   Wt 146 lb (66.2 kg)   SpO2 93%   BMI 25.86 kg/m        Assessment & Plan:  Jasmin Butler in today with chief complaint of Shoulder Pain (Right shoulder for 4 days. No injury. Woke up like this)   1. Acute pain of right shoulder Icy hot bid Rest If not improving on steroids let us know Meds ordered this encounter  Medications   predniSONE (DELTASONE) 20 MG tablet    Sig: Take 2 tablets (40 mg total) by mouth daily with  breakfast for 5 days. 2 po daily for 5 days    Dispense:  10 tablet    Refill:  0    Order Specific Question:   Supervising Provider    Answer:   Caryl Pina A [[id number]]       The above assessment and management plan was discussed with the patient. The patient verbalized understanding of and has agreed to the management plan. Patient is aware to call the clinic if symptoms persist or worsen. Patient is aware when to return to the clinic for a follow-up visit. Patient educated on when it is appropriate to go to the emergency department.   Miguel-Margaret Hassell Done, FNP

## 2022-08-12 NOTE — Patient Instructions (Signed)

## 2022-08-15 ENCOUNTER — Ambulatory Visit (INDEPENDENT_AMBULATORY_CARE_PROVIDER_SITE_OTHER): Payer: Medicare HMO | Admitting: *Deleted

## 2022-08-15 DIAGNOSIS — E538 Deficiency of other specified B group vitamins: Secondary | ICD-10-CM | POA: Diagnosis not present

## 2022-08-19 ENCOUNTER — Ambulatory Visit: Payer: Medicare HMO | Admitting: Nurse Practitioner

## 2022-09-03 ENCOUNTER — Ambulatory Visit (INDEPENDENT_AMBULATORY_CARE_PROVIDER_SITE_OTHER): Payer: Medicare HMO

## 2022-09-03 ENCOUNTER — Encounter: Payer: Self-pay | Admitting: Family Medicine

## 2022-09-03 VITALS — Ht 63.0 in | Wt 145.0 lb

## 2022-09-03 DIAGNOSIS — Z0000 Encounter for general adult medical examination without abnormal findings: Secondary | ICD-10-CM

## 2022-09-03 NOTE — Progress Notes (Signed)
Subjective:   Jasmin Butler is a 71 y.o. female who presents for Medicare Annual (Subsequent) preventive examination. I connected with  [name] on 09/03/22 by a audio enabled telemedicine application and verified that I am speaking with the correct person using two identifiers.  Patient Location: Home  Provider Location: Home Office  I discussed the limitations of evaluation and management by telemedicine. The patient expressed understanding and agreed to proceed.  Review of Systems     Cardiac Risk Factors include: advanced age (>60mn, >>52women)     Objective:    Today's Vitals   09/03/22 1033  Weight: 145 lb (65.8 kg)  Height: '5\' 3"'$  (1.6 m)   Body mass index is 25.69 kg/m.     09/03/2022   10:36 AM 08/31/2021   10:47 AM 08/30/2020   10:45 AM 08/19/2020    3:32 PM 06/07/2020   10:14 PM 06/07/2020   12:21 PM 06/23/2019    9:21 AM  Advanced Directives  Does Patient Have a Medical Advance Directive? No No No No  No No  Would patient like information on creating a medical advance directive? No - Patient declined No - Patient declined No - Patient declined  No - Patient declined  No - Patient declined    Current Medications (verified) Outpatient Encounter Medications as of 09/03/2022  Medication Sig   cyanocobalamin (,VITAMIN B-12,) 1000 MCG/ML injection Inject 1,000 mcg into the muscle every 30 (thirty) days.    cyclobenzaprine (FLEXERIL) 5 MG tablet Take 1 tablet (5 mg total) by mouth 3 (three) times daily as needed for muscle spasms.   EPINEPHrine (EPIPEN 2-PAK) 0.3 mg/0.3 mL IJ SOAJ injection Inject 0.3 mg into the muscle as needed for anaphylaxis.   famotidine (PEPCID) 20 MG tablet Take 1 tablet (20 mg total) by mouth 2 (two) times daily.   hydrOXYzine (ATARAX) 25 MG tablet Take 0.5-1 tablets (12.5-25 mg total) by mouth every 8 (eight) hours as needed for anxiety.   levothyroxine (SYNTHROID) 75 MCG tablet TAKE 1 TABLET BY MOUTH EVERY DAY   linaclotide  (LINZESS) 145 MCG CAPS capsule Take 1 capsule (145 mcg total) by mouth daily before breakfast. Please inform that insurance will not cover Trulance   mirtazapine (REMERON) 7.5 MG tablet Take 1 tablet (7.5 mg total) by mouth at bedtime.   rosuvastatin (CRESTOR) 40 MG tablet Take 1 tablet (40 mg total) by mouth every other day.   Facility-Administered Encounter Medications as of 09/03/2022  Medication   cyanocobalamin ((VITAMIN B-12)) injection 1,000 mcg    Allergies (verified) Penicillins   History: Past Medical History:  Diagnosis Date   Allergy to alpha-gal    Anxiety    Cataract    Depression    "couple times/yr" (09/29/2014)   GERD (gastroesophageal reflux disease) 2005   Headache    "weekly" (09/29/2014)   History of hiatal hernia    Hyperlipidemia    IBS (irritable bowel syndrome)    Migraine    "2-3 times/yr" (09/29/2014)   Pernicious anemia    Pneumonia 1980's   "double"   Thyroid disease    Urticaria    Past Surgical History:  Procedure Laterality Date   ABDOMINAL HYSTERECTOMY  1985   CARDIAC CATHETERIZATION  ~ 2005   CATARACT EXTRACTION W/ INTRAOCULAR LENS  IMPLANT, BILATERAL Bilateral 2000's   CHOLECYSTECTOMY OPEN  ~ 1990   EYE SURGERY     TUBAL LIGATION  ~ 1983   Family History  Problem Relation Age of Onset  Leukemia Father    Cancer Father        leukemia   Hypertension Mother    Stroke Mother    Arthritis Mother    Cancer Mother    Cancer Brother        4 different cancers   Asthma Brother    Birth defects Son    Social History   Socioeconomic History   Marital status: Widowed    Spouse name: Not on file   Number of children: 3   Years of education: GED   Highest education level: GED or equivalent  Occupational History   Occupation: Retired- Engineer, materials  Tobacco Use   Smoking status: Never   Smokeless tobacco: Never  Vaping Use   Vaping Use: Never used  Substance and Sexual Activity   Alcohol use: No   Drug use: Never    Sexual activity: Not Currently  Other Topics Concern   Not on file  Social History Narrative   2 story home   Her son lives with her   Very independent   Does not believe in annual screenings   Social Determinants of Health   Financial Resource Strain: [place]  (09/03/2022)   Overall Financial Resource Strain (CARDIA)    Difficulty of Paying Living Expenses: Not hard at all  Food Insecurity: No Food Insecurity (09/03/2022)   Hunger Vital Sign    Worried About Running Out of Food in the Last Year: Never true    [place] in the Last Year: Never true  Transportation Needs: No Transportation Needs (09/03/2022)   PRAPARE - Hydrologist (Medical): No    Lack of Transportation (Non-Medical): No  Physical Activity: Sufficiently Active (09/03/2022)   Exercise Vital Sign    Days of Exercise per Week: 5 days    Minutes of Exercise per Session: 30 min  Stress: No Stress Concern Present (09/03/2022)   [place]    Feeling of Stress : Not at all  Social Connections: Moderately Integrated (09/03/2022)   Social Connection and Isolation Panel [NHANES]    Frequency of Communication with Friends and Family: More than three times a week    Frequency of Social Gatherings with Friends and Family: More than three times a week    Attends Religious Services: More than 4 times per year    Active Member of [employer] or Organizations: Yes    Attends Archivist Meetings: More than 4 times per year    Marital Status: Widowed    Tobacco Counseling Counseling given: Not Answered   Clinical Intake:  Pre-visit preparation completed: Yes  Pain : No/denies pain     Nutritional Risks: None Diabetes: No  How often do you need to have someone help you when you read instructions, pamphlets, or other written materials from your doctor or pharmacy?: 1 - Never  Diabetic?no   Interpreter  Needed?: No  Information entered by :: Jadene Pierini, LPN   Activities of Daily Living    09/03/2022   10:36 AM 08/30/2022    8:36 AM  In your present state of health, do you have any difficulty performing the following activities:  Hearing? 0 0  Vision? 0 0  Difficulty concentrating or making decisions? 0 0  Walking or climbing stairs? 0 0  Dressing or bathing? 0 0  Doing errands, shopping? 0 0  Preparing Food and eating ? N N  Using the Toilet?  N N  In the past six months, have you accidently leaked urine? N N  Do you have problems with loss of bowel control? N N  Managing your Medications? N N  Managing your Finances? N N  Housekeeping or managing your Housekeeping? N N    Patient Care Team: [name], DO as PCP - General (Family Medicine) Minus Breeding, MD as PCP - Cardiology (Cardiology)  Indicate any recent Medical Services you may have received from other than [facility] providers in the past year (date may be approximate).     Assessment:   This is a routine wellness examination for [facility].  Hearing/Vision screen Vision Screening - Comments:: Wears rx glasses - up to date with routine eye exams with    Dietary issues and exercise activities discussed: Current Exercise Habits: Home exercise routine, Type of exercise: walking, Time (Minutes): 30, Frequency (Times/Week): 5, Weekly Exercise (Minutes/Week): 150, Intensity: Mild, Exercise limited by: None identified   Goals Addressed             This Visit's Progress    DIET - EAT MORE FRUITS AND VEGETABLES   On track      Depression Screen    09/03/2022   10:35 AM 08/12/2022   12:33 PM 07/05/2022    8:29 AM 06/14/2022   11:35 AM 01/29/2022   12:54 PM 01/01/2022    8:31 AM 10/29/2021   12:58 PM  PHQ 2/9 Scores  PHQ - 2 Score 0 0 0 0 0 0 0  PHQ- 9 Score 0 0   0 0 0    Fall Risk    09/03/2022   10:34 AM 08/30/2022    8:36 AM 08/12/2022   12:33 PM 07/05/2022    8:29 AM 06/14/2022   11:34 AM  Fall  Risk   Falls in the past year? 0 0 0 0 0  Number falls in past yr: 0      Injury with Fall? 0      Risk for fall due to : No Fall Risks      Follow up Falls prevention discussed        [place]:  Any stairs in or around the home? Yes  If so, are there any without handrails? No  Home free of loose throw rugs in walkways, pet beds, electrical cords, etc? Yes  Adequate lighting in your home to reduce risk of falls? Yes   ASSISTIVE DEVICES UTILIZED TO PREVENT FALLS:  Life alert? No  Use of a cane, walker or w/c? No  Grab bars in the bathroom? Yes  Shower chair or bench in shower? Yes  Elevated toilet seat or a handicapped toilet? Yes       06/19/2018   11:34 AM  MMSE - Mini Mental State Exam  Orientation to time 5  Orientation to Place 5  Registration 3  Attention/ Calculation 4  Recall 3  Language- name 2 objects 2  Language- repeat 1  Language- follow 3 step command 3  Language- read & follow direction 1  Write a sentence 1  Copy design 1  Total score 29        09/03/2022   10:36 AM 08/30/2020   10:46 AM 06/23/2019    9:28 AM  6CIT Screen  What Year? 0 points 0 points 0 points  What month? 0 points 0 points 0 points  What time? 0 points 0 points 0 points  Count back from 20 0  points 0 points 0 points  Months in reverse 0 points 0 points 0 points  Repeat phrase 0 points 0 points 0 points  Total Score 0 points 0 points 0 points    Immunizations Immunization History  Administered Date(s) Administered   Fluad Quad(high Dose 65+) 08/28/2016, 07/23/2017, 06/14/2019, 07/14/2020, 07/16/2021   Influenza, High Dose Seasonal PF 08/28/2016, 07/23/2017, 07/15/2018   Influenza,inj,Quad PF,6+ Mos 08/14/2020   Influenza-Unspecified 08/14/2014, 08/28/2016, 07/23/2017   Moderna Sars-Covid-2 Vaccination 11/18/2019   Pneumococcal Conjugate-13 12/12/2015   Pneumococcal Polysaccharide-23 02/27/2017   Tdap 06/19/2018   Zoster Recombinat  (Shingrix) 02/13/2022, 05/16/2022    TDAP status: Up to date  Flu Vaccine status: Up to date  Pneumococcal vaccine status: Up to date  Covid-19 vaccine status: Completed vaccines  Qualifies for Shingles Vaccine? Yes   Zostavax completed Yes   Shingrix Completed?: Yes  Screening Tests Health Maintenance  Topic Date Due   COVID-19 Vaccine (2 - Moderna risk series) 12/16/2019   INFLUENZA VACCINE  01/12/2023 (Originally 05/14/2022)   Medicare Annual Wellness (AWV)  09/04/2023   Fecal DNA (Cologuard)  02/04/2025   Pneumonia Vaccine 68+ Years old  Completed   Hepatitis C Screening  Completed   Zoster Vaccines- Shingrix  Completed   HPV VACCINES  Aged Out   MAMMOGRAM  Discontinued   DEXA SCAN  Discontinued    Health Maintenance  Health Maintenance Due  Topic Date Due   COVID-19 Vaccine (2 - Moderna risk series) 12/16/2019    Colorectal cancer screening: Type of screening: Cologuard. Completed 02/04/2022. Repeat every 3 years  Mammogram status: Ordered declined at this time . Pt provided with contact info and advised to call to schedule appt.   Bone Density status: Ordered declined at this time . Pt provided with contact info and advised to call to schedule appt.  Lung Cancer Screening: (Low Dose CT Chest recommended if Age 101-80 years, 30 pack-year currently smoking OR have quit w/in 15years.) does not qualify.   Lung Cancer Screening Referral: n/a  Additional Screening:  Hepatitis C Screening: does not qualify; Completed 11/03/2017  Vision Screening: Recommended annual ophthalmology exams for early detection of glaucoma and other disorders of the eye. Is the patient up to date with their annual eye exam?  Yes  Who is the provider or what is the name of the office in which the patient attends annual eye exams? [facility]  If pt is not established with a provider, would they like to be referred to a provider to establish care? No .   Dental Screening: Recommended  annual dental exams for proper oral hygiene  Community Resource Referral / Chronic Care Management: CRR required this visit?  No   CCM required this visit?  No      Plan:     I have personally reviewed and noted the following in the patient's chart:   Medical and social history Use of alcohol, tobacco or illicit drugs  Current medications and supplements including opioid prescriptions. Patient is not currently taking opioid prescriptions. Functional ability and status Nutritional status Physical activity Advanced directives List of other physicians Hospitalizations, surgeries, and ER visits in previous 12 months Vitals Screenings to include cognitive, depression, and falls Referrals and appointments  In addition, I have reviewed and discussed with patient certain preventive protocols, quality metrics, and best practice recommendations. A written personalized care plan for preventive services as well as general preventive health recommendations were provided to patient.     Daphane Shepherd,  LPN   [id number]   Nurse Notes: Declines Dexa and Mammogram

## 2022-09-03 NOTE — Patient Instructions (Signed)
Ms. Lampert , Thank you for taking time to come for your Medicare Wellness Visit. I appreciate your ongoing commitment to your health goals. Please review the following plan we discussed and let me know if I can assist you in the future.   These are the goals we discussed:  Goals      DIET - EAT MORE FRUITS AND VEGETABLES     Exercise 150 min/wk Moderate Activity        This is a list of the screening recommended for you and due dates:  Health Maintenance  Topic Date Due   COVID-19 Vaccine (2 - Moderna risk series) 12/16/2019   Flu Shot  01/12/2023*   Medicare Annual Wellness Visit  09/04/2023   Cologuard (Stool DNA test)  02/04/2025   Pneumonia Vaccine  Completed   Hepatitis C Screening: USPSTF Recommendation to screen - Ages 18-79 yo.  Completed   Zoster (Shingles) Vaccine  Completed   HPV Vaccine  Aged Out   Mammogram  Discontinued   DEXA scan (bone density measurement)  Discontinued  *Topic was postponed. The date shown is not the original due date.    Advanced directives: Advance directive discussed with you today. I have provided a copy for you to complete at home and have notarized. Once this is complete please bring a copy in to our office so we can scan it into your chart.   Conditions/risks identified: Aim for 30 minutes of exercise or brisk walking, 6-8 glasses of water, and 5 servings of fruits and vegetables each day.   Next appointment: Follow up in one year for your annual wellness visit    Preventive Care 65 Years and Older, Female Preventive care refers to lifestyle choices and visits with your health care provider that can promote health and wellness. What does preventive care include? A yearly physical exam. This is also called an annual well check. Dental exams once or twice a year. Routine eye exams. Ask your health care provider how often you should have your eyes checked. Personal lifestyle choices, including: Daily care of your teeth and gums. Regular  physical activity. Eating a healthy diet. Avoiding tobacco and drug use. Limiting alcohol use. Practicing safe sex. Taking low-dose aspirin every day. Taking vitamin and mineral supplements as recommended by your health care provider. What happens during an annual well check? The services and screenings done by your health care provider during your annual well check will depend on your age, overall health, lifestyle risk factors, and family history of disease. Counseling  Your health care provider may ask you questions about your: Alcohol use. Tobacco use. Drug use. Emotional well-being. Home and relationship well-being. Sexual activity. Eating habits. History of falls. Memory and ability to understand (cognition). Work and work Statistician. Reproductive health. Screening  You may have the following tests or measurements: Height, weight, and BMI. Blood pressure. Lipid and cholesterol levels. These may be checked every 5 years, or more frequently if you are over 22 years old. Skin check. Lung cancer screening. You may have this screening every year starting at age 30 if you have a 30-pack-year history of smoking and currently smoke or have quit within the past 15 years. Fecal occult blood test (FOBT) of the stool. You may have this test every year starting at age 73. Flexible sigmoidoscopy or colonoscopy. You may have a sigmoidoscopy every 5 years or a colonoscopy every 10 years starting at age 26. Hepatitis C blood test. Hepatitis B blood test. Sexually transmitted disease (  STD) testing. Diabetes screening. This is done by checking your blood sugar (glucose) after you have not eaten for a while (fasting). You may have this done every 1-3 years. Bone density scan. This is done to screen for osteoporosis. You may have this done starting at age 97. Mammogram. This may be done every 1-2 years. Talk to your health care provider about how often you should have regular mammograms. Talk  with your health care provider about your test results, treatment options, and if necessary, the need for more tests. Vaccines  Your health care provider may recommend certain vaccines, such as: Influenza vaccine. This is recommended every year. Tetanus, diphtheria, and acellular pertussis (Tdap, Td) vaccine. You may need a Td booster every 10 years. Zoster vaccine. You may need this after age 48. Pneumococcal 13-valent conjugate (PCV13) vaccine. One dose is recommended after age 62. Pneumococcal polysaccharide (PPSV23) vaccine. One dose is recommended after age 60. Talk to your health care provider about which screenings and vaccines you need and how often you need them. This information is not intended to replace advice given to you by your health care provider. Make sure you discuss any questions you have with your health care provider. Document Released: 10/27/2015 Document Revised: 06/19/2016 Document Reviewed: 08/01/2015 Elsevier Interactive Patient Education  2017 [employer] Prevention in the Home Falls can cause injuries. They can happen to people of all ages. There are many things you can do to make your home safe and to help prevent falls. What can I do on the outside of my home? Regularly fix the edges of walkways and driveways and fix any cracks. Remove anything that might make you trip as you walk through a door, such as a raised step or threshold. Trim any bushes or trees on the path to your home. Use bright outdoor lighting. Clear any walking paths of anything that might make someone trip, such as rocks or tools. Regularly check to see if handrails are loose or broken. Make sure that both sides of any steps have handrails. Any raised decks and porches should have guardrails on the edges. Have any leaves, snow, or ice cleared regularly. Use sand or salt on walking paths during winter. Clean up any spills in your garage right away. This includes oil or grease  spills. What can I do in the bathroom? Use night lights. Install grab bars by the toilet and in the tub and shower. Do not use towel bars as grab bars. Use non-skid mats or decals in the tub or shower. If you need to sit down in the shower, use a plastic, non-slip stool. Keep the floor dry. Clean up any water that spills on the floor as soon as it happens. Remove soap buildup in the tub or shower regularly. Attach bath mats securely with double-sided non-slip rug tape. Do not have throw rugs and other things on the floor that can make you trip. What can I do in the bedroom? Use night lights. Make sure that you have a light by your bed that is easy to reach. Do not use any sheets or blankets that are too big for your bed. They should not hang down onto the floor. Have a firm chair that has side arms. You can use this for support while you get dressed. Do not have throw rugs and other things on the floor that can make you trip. What can I do in the kitchen? Clean up any spills right away. Avoid walking on  wet floors. Keep items that you use a lot in easy-to-reach places. If you need to reach something above you, use a strong step stool that has a grab bar. Keep electrical cords out of the way. Do not use floor polish or wax that makes floors slippery. If you must use wax, use non-skid floor wax. Do not have throw rugs and other things on the floor that can make you trip. What can I do with my stairs? Do not leave any items on the stairs. Make sure that there are handrails on both sides of the stairs and use them. Fix handrails that are broken or loose. Make sure that handrails are as long as the stairways. Check any carpeting to make sure that it is firmly attached to the stairs. Fix any carpet that is loose or worn. Avoid having throw rugs at the top or bottom of the stairs. If you do have throw rugs, attach them to the floor with carpet tape. Make sure that you have a light switch at the  top of the stairs and the bottom of the stairs. If you do not have them, ask someone to add them for you. What else can I do to help prevent falls? Wear shoes that: Do not have high heels. Have rubber bottoms. Are comfortable and fit you well. Are closed at the toe. Do not wear sandals. If you use a stepladder: Make sure that it is fully opened. Do not climb a closed stepladder. Make sure that both sides of the stepladder are locked into place. Ask someone to hold it for you, if possible. Clearly mark and make sure that you can see: Any grab bars or handrails. First and last steps. Where the edge of each step is. Use tools that help you move around (mobility aids) if they are needed. These include: Canes. Walkers. Scooters. Crutches. Turn on the lights when you go into a dark area. Replace any light bulbs as soon as they burn out. Set up your furniture so you have a clear path. Avoid moving your furniture around. If any of your floors are uneven, fix them. If there are any pets around you, be aware of where they are. Review your medicines with your doctor. Some medicines can make you feel dizzy. This can increase your chance of falling. Ask your doctor what other things that you can do to help prevent falls. This information is not intended to replace advice given to you by your health care provider. Make sure you discuss any questions you have with your health care provider. Document Released: 07/27/2009 Document Revised: 03/07/2016 Document Reviewed: 11/04/2014 Elsevier Interactive Patient Education  2017 [employer].

## 2022-09-13 ENCOUNTER — Ambulatory Visit (INDEPENDENT_AMBULATORY_CARE_PROVIDER_SITE_OTHER): Payer: Medicare HMO | Admitting: *Deleted

## 2022-09-13 DIAGNOSIS — Z23 Encounter for immunization: Secondary | ICD-10-CM

## 2022-09-13 DIAGNOSIS — E538 Deficiency of other specified B group vitamins: Secondary | ICD-10-CM

## 2022-09-26 ENCOUNTER — Encounter: Payer: Self-pay | Admitting: Family Medicine

## 2022-09-26 ENCOUNTER — Other Ambulatory Visit: Payer: Self-pay | Admitting: Family Medicine

## 2022-09-26 DIAGNOSIS — L509 Urticaria, unspecified: Secondary | ICD-10-CM

## 2022-09-30 ENCOUNTER — Ambulatory Visit (INDEPENDENT_AMBULATORY_CARE_PROVIDER_SITE_OTHER): Payer: Medicare HMO | Admitting: Family Medicine

## 2022-09-30 ENCOUNTER — Encounter: Payer: Self-pay | Admitting: Family Medicine

## 2022-09-30 ENCOUNTER — Encounter: Payer: Medicare HMO | Admitting: Internal Medicine

## 2022-09-30 VITALS — BP 138/75 | HR 65 | Temp 97.9°F | Ht 63.0 in | Wt 148.5 lb

## 2022-09-30 DIAGNOSIS — M25551 Pain in right hip: Secondary | ICD-10-CM

## 2022-09-30 DIAGNOSIS — M25552 Pain in left hip: Secondary | ICD-10-CM

## 2022-09-30 DIAGNOSIS — R768 Other specified abnormal immunological findings in serum: Secondary | ICD-10-CM | POA: Diagnosis not present

## 2022-09-30 MED ORDER — PREDNISONE 20 MG PO TABS: 40.0000 mg | ORAL_TABLET | Freq: Every day | ORAL | 0 refills | Status: AC

## 2022-09-30 NOTE — Patient Instructions (Signed)
Hip Bursitis  Hip bursitis is the swelling of one or more of the fluid-filled sacs (bursae) in the hip joint. The hip bursae absorb shocks and prevent bones from rubbing against each other. If a bursa becomes irritated, it can fill with extra fluid and become inflamed. Hip bursitis can cause mild to moderate pain, and symptoms often come and go over time. What are the causes? This condition results from increased friction between the hip bones and the tendons around the hip joint. This condition can happen if you: Overuse your hip muscles. Injure your hip. Have weak buttocks muscles. Have bone spurs. Have an infection. In some cases, the cause may not be known. What increases the risk? You are more likely to develop this condition if: You injured your hip previously or had hip surgery. You have a medical condition, such as arthritis, gout, diabetes, or thyroid disease. You have spine problems. You have one leg that is shorter than the other. You participate in athletic activities that include repetitive motion, like running. You participate in sports where there is a risk of injury or falling, such as football, martial arts, or skiing. What are the signs or symptoms? Symptoms may come and go, and they often include: Pain in the hip or groin area. Pain may get worse with movement. Tenderness and swelling of the hip. In rare cases, the bursa may become infected. If this happens, you may get a fever, as well as warmth and redness in the hip area. How is this diagnosed? This condition may be diagnosed based on: Your symptoms. Your medical history. A physical exam. Imaging tests, such as: X-rays to check your bones. MRI or ultrasound to check your tendons and muscles. Bone scan. How is this treated? This condition is treated by resting, icing, applying pressure (compression), and raising (elevating) the injured area. This is called RICE treatment. In some cases, RICE treatment may not  be enough to make your symptoms go away. Treatment may also include: Using crutches, a cane, or a walker to decrease the strain on your hip. Taking medicine to help with swelling and pain. Getting a shot of cortisone medicine near the affected area to reduce swelling and pain. Taking antibiotic medicines if there is an infection. Draining fluid out of the bursa to help relieve swelling and pain. Having surgery to remove a damaged or infected bursa. This is rare. Long-term treatment may include: Physical therapy exercises for strength and flexibility. Identifying the cause of your bursitis to prevent future episodes. Lifestyle changes, such as weight loss, to reduce the strain on the hip. Follow these instructions at home: Managing pain, stiffness, and swelling     If directed, put ice on the affected area. To do this: Put ice in a plastic bag. Place a towel between your skin and the bag. Leave the ice on for 20 minutes, 2-3 times a day. Remove the ice if your skin turns bright red. This is very important. If you cannot feel pain, heat, or cold, you have a greater risk of damage to the area. Elevate your hip as much as you can without feeling pain. To do this, put a pillow under your hips while you lie down. If directed, apply heat to the affected area as often as told by your health care provider. Use the heat source that your health care provider recommends, such as a moist heat pack or a heating pad. Place a towel between your skin and the heat source. Leave the heat   on for 20-30 minutes. Remove the heat if your skin turns bright red. This is especially important if you are unable to feel pain, heat, or cold. You may have a greater risk of getting burned. Activity Do not use your hip to support your body weight until your health care provider says that you can. Use crutches, a cane, or a walker as told by your health care provider. If the affected leg is one that you use to drive, ask  your health care provider if it is safe to drive. Rest and protect your hip as much as possible until your pain and swelling get better. Return to your normal activities as told by your health care provider. Ask your health care provider what activities are safe for you. Do exercises as told by your health care provider. General instructions Take over-the-counter and prescription medicines only as told by your health care provider. Gently massage and stretch your injured area as often as is comfortable. Wear compression wraps only as told by your health care provider. If one of your legs is shorter than the other, get fitted for a shoe insert or orthotic. Your health care provider or physical therapist can tell you where to find these items and what size you need. Maintain a healthy weight. Follow instructions from your health care provider for weight control. These may include dietary restrictions. Keep all follow-up visits. This is important. How is this prevented? Exercise regularly or as told by your health care provider. Wear supportive footwear that is appropriate for your sport and daily activities. Warm up and stretch before being active. Cool down and stretch after being active. Take breaks regularly from repetitive activity. If an activity irritates your hip or causes pain, avoid the activity as much as possible. Avoid sitting down for long periods at a time. Where to find more information American [employer]: orthoinfo.aaos.org Contact a health care provider if: You have a fever. You develop new symptoms. You have trouble walking or doing everyday activities. You have pain that gets worse or does not get better with medicine. You develop red skin or a feeling of warmth in your hip area. Get help right away if: You cannot move your hip. You have severe pain. You cannot control the muscles in your feet. Summary Hip bursitis is the swelling of one or more  of the fluid-filled sacs (bursae) in the hip joint. Hip bursitis can cause hip or groin pain, and symptoms often come and go over time. This condition is often treated by resting, icing, applying pressure (compression), and raising (elevating) the injured area. Other treatments may be needed. This information is not intended to replace advice given to you by your health care provider. Make sure you discuss any questions you have with your health care provider. Document Revised: 09/25/2021 Document Reviewed: 09/25/2021 Elsevier Patient Education  2023 [employer]  

## 2022-09-30 NOTE — Progress Notes (Signed)
   Acute Office Visit  Subjective:     Patient ID: Jasmin Butler, female    DOB: Jan 30, 1951, 71 y.o.   MRN: [id number]  Chief Complaint  Patient presents with   Hip Pain    Hip Pain    Patient is in today for bilateral hip pain for 1 week. The pain is achy and constant. Pain is aggravated by movement. Denies relieving factors. There is tenderness in the some spot on both sides, over the great trochanter. Denies injury, warmth, erythema, fever, numbness, tingling, or swelling. She reports a history of bursitis that responded well to a predisone burst. She has been taking advil with mild improvement.   She has seen rheumatology for a positive ANA. She repots that she had a negative work up.   ROS As per HPI.      Objective:    BP 138/75   Pulse 65   Temp 97.9 F (36.6 C) (Temporal)   Ht '5\' 3"'$  (1.6 m)   Wt 148 lb 8 oz (67.4 kg)   SpO2 96%   BMI 26.31 kg/m    Physical Exam Vitals and nursing note reviewed.  Constitutional:      General: She is not in acute distress.    Appearance: She is not ill-appearing, toxic-appearing or diaphoretic.  Pulmonary:     Effort: Pulmonary effort is normal. No respiratory distress.  Musculoskeletal:     Right hip: Tenderness present. No deformity or crepitus. Normal range of motion. Normal strength.     Left hip: Tenderness present. No deformity or crepitus. Normal range of motion. Normal strength.     Right lower leg: No edema.     Left lower leg: No edema.     Comments: Bilateral tenderness over great trochanter.   Skin:    General: Skin is warm and dry.  Neurological:     General: No focal deficit present.     Mental Status: She is alert and oriented to person, place, and time.  Psychiatric:        Mood and Affect: Mood normal.        Behavior: Behavior normal.     No results found for any visits on 09/30/22.      Assessment & Plan:   Jasmin Butler was seen today for hip pain.  Diagnoses and all orders for this  visit:  Bilateral hip pain Consistent with hip bursitis. Prednisone burst as below. Discussed symptomatic are. Return to office for new or worsening symptoms, or if symptoms persist.  -     predniSONE (DELTASONE) 20 MG tablet; Take 2 tablets (40 mg total) by mouth daily with breakfast for 5 days.  Positive ANA (antinuclear antibody) Reports negative workup with rheumatology.    Return if symptoms worsen or fail to improve.  Gwenlyn Perking, FNP

## 2022-10-15 ENCOUNTER — Ambulatory Visit (INDEPENDENT_AMBULATORY_CARE_PROVIDER_SITE_OTHER): Payer: Medicare HMO | Admitting: *Deleted

## 2022-10-15 DIAGNOSIS — E538 Deficiency of other specified B group vitamins: Secondary | ICD-10-CM | POA: Diagnosis not present

## 2022-10-15 NOTE — Progress Notes (Signed)
Vitamin b12 injection given and patient tolerated well.  

## 2022-11-15 ENCOUNTER — Ambulatory Visit (INDEPENDENT_AMBULATORY_CARE_PROVIDER_SITE_OTHER): Payer: Medicare HMO

## 2022-11-15 DIAGNOSIS — E538 Deficiency of other specified B group vitamins: Secondary | ICD-10-CM

## 2022-11-15 NOTE — Progress Notes (Signed)
Cyanocobalamin injection given to right deltoid.  Patient tolerated well. 

## 2022-12-03 ENCOUNTER — Other Ambulatory Visit: Payer: Self-pay | Admitting: Family Medicine

## 2022-12-03 DIAGNOSIS — L509 Urticaria, unspecified: Secondary | ICD-10-CM

## 2022-12-09 ENCOUNTER — Encounter: Payer: Self-pay | Admitting: Family Medicine

## 2022-12-09 DIAGNOSIS — H43813 Vitreous degeneration, bilateral: Secondary | ICD-10-CM | POA: Diagnosis not present

## 2022-12-09 DIAGNOSIS — H52223 Regular astigmatism, bilateral: Secondary | ICD-10-CM | POA: Diagnosis not present

## 2022-12-09 DIAGNOSIS — H524 Presbyopia: Secondary | ICD-10-CM | POA: Diagnosis not present

## 2022-12-09 DIAGNOSIS — Z961 Presence of intraocular lens: Secondary | ICD-10-CM | POA: Diagnosis not present

## 2022-12-10 ENCOUNTER — Other Ambulatory Visit: Payer: Self-pay

## 2022-12-11 ENCOUNTER — Other Ambulatory Visit: Payer: Medicare HMO

## 2022-12-11 DIAGNOSIS — E559 Vitamin D deficiency, unspecified: Secondary | ICD-10-CM

## 2022-12-11 DIAGNOSIS — I10 Essential (primary) hypertension: Secondary | ICD-10-CM

## 2022-12-11 DIAGNOSIS — E538 Deficiency of other specified B group vitamins: Secondary | ICD-10-CM

## 2022-12-11 NOTE — Progress Notes (Addendum)
Jasmin Butler is a 72 y.o. female presents to office today for annual physical exam examination.    Concerns today include: She wants to discuss how to reduce med list.  Sleep has been off and on.  She only uses Mirtazapine as a prn, no more than 2 days per week.  Has atarax that she does use more regularly.  Denies GERD. Using pepcid BID.  Compliant with synthroid.  Using Linzess every 1-2 weeks.    Also reports some memory changes.   She worries about Alzheimer's disease.  Her mother suffered from this.  Sometimes she calls her dog the wrong name.  No reports of getting lost, hallucinations, visual changes, weakness  Occupation: retired  Last colonoscopy: cologuard due 02/04/25 Refills needed today: as above Immunizations needed: Immunization History  Administered Date(s) Administered   Fluad Quad(high Dose 65+) 08/28/2016, 07/23/2017, 06/14/2019, 07/14/2020, 07/16/2021, 09/13/2022   Influenza, High Dose Seasonal PF 08/28/2016, 07/23/2017, 07/15/2018   Influenza,inj,Quad PF,6+ Mos 08/14/2020   Influenza-Unspecified 08/14/2014, 08/28/2016, 07/23/2017   Moderna Sars-Covid-2 Vaccination 11/18/2019   Pneumococcal Conjugate-13 12/12/2015   Pneumococcal Polysaccharide-23 02/27/2017   Tdap 06/19/2018   Zoster Recombinat (Shingrix) 02/13/2022, 05/16/2022     Past Medical History:  Diagnosis Date   Allergy to alpha-gal    Anxiety    Cataract    Depression    "couple times/yr" (09/29/2014)   GERD (gastroesophageal reflux disease) 2005   Headache    "weekly" (09/29/2014)   History of hiatal hernia    Hyperlipidemia    IBS (irritable bowel syndrome)    Migraine    "2-3 times/yr" (09/29/2014)   Pernicious anemia    Pneumonia 1980's   "double"   Thyroid disease    Urticaria    Social History   Socioeconomic History   Marital status: Widowed    Spouse name: Not on file   Number of children: 3   Years of education: GED   Highest education level: GED or equivalent   Occupational History   Occupation: Retired- Engineer, materials  Tobacco Use   Smoking status: Never   Smokeless tobacco: Never  Vaping Use   Vaping Use: Never used  Substance and Sexual Activity   Alcohol use: No   Drug use: Never   Sexual activity: Not Currently  Other Topics Concern   Not on file  Social History Narrative   2 story home   Her son lives with her   Very independent   Does not believe in annual screenings   Social Determinants of Health   Financial Resource Strain: [place]  (09/03/2022)   Overall Financial Resource Strain (CARDIA)    Difficulty of Paying Living Expenses: Not hard at all  Food Insecurity: No Food Insecurity (09/03/2022)   Hunger Vital Sign    Worried About Running Out of Food in the Last Year: Never true    [place] in the Last Year: Never true  Transportation Needs: No Transportation Needs (09/03/2022)   PRAPARE - Hydrologist (Medical): No    Lack of Transportation (Non-Medical): No  Physical Activity: Sufficiently Active (09/03/2022)   Exercise Vital Sign    Days of Exercise per Week: 5 days    Minutes of Exercise per Session: 30 min  Stress: No Stress Concern Present (09/03/2022)   [place]    Feeling of Stress : Not at all  Social Connections: Moderately Integrated (09/03/2022)   Social  Connection and Isolation Panel [NHANES]    Frequency of Communication with Friends and Family: More than three times a week    Frequency of Social Gatherings with Friends and Family: More than three times a week    Attends Religious Services: More than 4 times per year    Active Member of [employer] or Organizations: Yes    Attends Archivist Meetings: More than 4 times per year    Marital Status: Widowed  Intimate Partner Violence: Not At Risk (09/03/2022)   Humiliation, Afraid, Rape, and Kick questionnaire    Fear of Current or Ex-Partner: No     Emotionally Abused: No    Physically Abused: No    Sexually Abused: No   Past Surgical History:  Procedure Laterality Date   ABDOMINAL HYSTERECTOMY  1985   CARDIAC CATHETERIZATION  ~ 2005   CATARACT EXTRACTION W/ INTRAOCULAR LENS  IMPLANT, BILATERAL Bilateral 2000's   CHOLECYSTECTOMY OPEN  ~ 1990   EYE SURGERY     TUBAL LIGATION  ~ 1983   Family History  Problem Relation Age of Onset   Leukemia Father    Cancer Father        leukemia   Hypertension Mother    Stroke Mother    Arthritis Mother    Cancer Mother    Cancer Brother        4 different cancers   Asthma Brother    Birth defects Son     Current Outpatient Medications:    cyanocobalamin (,VITAMIN B-12,) 1000 MCG/ML injection, Inject 1,000 mcg into the muscle every 30 (thirty) days. , Disp: , Rfl:    cyclobenzaprine (FLEXERIL) 5 MG tablet, Take 1 tablet (5 mg total) by mouth 3 (three) times daily as needed for muscle spasms., Disp: 30 tablet, Rfl: 1   EPINEPHrine (EPIPEN 2-PAK) 0.3 mg/0.3 mL IJ SOAJ injection, Inject 0.3 mg into the muscle as needed for anaphylaxis., Disp: 1 each, Rfl: 2   famotidine (PEPCID) 20 MG tablet, TAKE 1 TABLET BY MOUTH TWICE A DAY, Disp: 180 tablet, Rfl: 0   hydrOXYzine (ATARAX) 25 MG tablet, Take 0.5-1 tablets (12.5-25 mg total) by mouth every 8 (eight) hours as needed for anxiety., Disp: 270 tablet, Rfl: 0   levothyroxine (SYNTHROID) 75 MCG tablet, TAKE 1 TABLET BY MOUTH EVERY DAY, Disp: 90 tablet, Rfl: 1   linaclotide (LINZESS) 145 MCG CAPS capsule, Take 1 capsule (145 mcg total) by mouth daily before breakfast. Please inform that insurance will not cover Trulance, Disp: 90 capsule, Rfl: 3   mirtazapine (REMERON) 7.5 MG tablet, Take 1 tablet (7.5 mg total) by mouth at bedtime., Disp: 90 tablet, Rfl: 3   rosuvastatin (CRESTOR) 40 MG tablet, Take 1 tablet (40 mg total) by mouth every other day., Disp: 90 tablet, Rfl: 3  Current Facility-Administered Medications:    cyanocobalamin ((VITAMIN  B-12)) injection 1,000 mcg, 1,000 mcg, Intramuscular, Q30 days, Jakita Dutkiewicz M, DO, 1,000 mcg at 11/15/22 [id number]  Allergies  Allergen Reactions   Alpha-Gal    Penicillins Rash    Did it involve swelling of the face/tongue/throat, SOB, or low BP? Yes Did it involve sudden or severe rash/hives, skin peeling, or any reaction on the inside of your mouth or nose? Unk Did you need to seek medical attention at a hospital or doctor's office? Yes When did it last happen? 30 years ago If all above answers are "NO", may proceed with cephalosporin use.      ROS: Review of Systems A comprehensive  review of systems was negative except for: Neurological: positive for memory problems    Physical exam BP 137/80   Pulse 62   Temp 98.6 F (37 C)   Ht '5\' 3"'$  (1.6 m)   Wt 147 lb (66.7 kg)   SpO2 96%   BMI 26.04 kg/m  General appearance: alert, cooperative, and appears stated age Head: Normocephalic, without obvious abnormality, atraumatic Eyes: negative findings: lids and lashes normal, conjunctivae and sclerae normal, corneas clear, and pupils equal, round, reactive to light and accomodation Ears: normal TM's and external ear canals both ears Nose: Nares normal. Septum midline. Mucosa normal. No drainage or sinus tenderness. Throat: lips, mucosa, and tongue normal; teeth and gums normal Neck: no adenopathy, supple, symmetrical, trachea midline, and thyroid not enlarged, symmetric, no tenderness/mass/nodules Back: symmetric, no curvature. ROM normal. No CVA tenderness. Lungs: clear to auscultation bilaterally Heart: regular rate and rhythm, S1, S2 normal, no murmur, click, rub or gallop Abdomen: soft, non-tender; bowel sounds normal; no masses,  no organomegaly Extremities: extremities normal, atraumatic, no cyanosis or edema Pulses: 2+ and symmetric Skin: Skin color, texture, turgor normal. No rashes or lesions Lymph nodes: Cervical, supraclavicular, and axillary nodes normal. Neurologic:  Grossly normal Psych: mood stable, speech normal.      12/13/2022   11:52 AM 06/19/2018   11:34 AM  MMSE - Mini Mental State Exam  Orientation to time 5 5  Orientation to Place 5 5  Registration 3 3  Attention/ Calculation 2 4  Recall 3 3  Language- name 2 objects 2 2  Language- repeat 1 1  Language- follow 3 step command 3 3  Language- read & follow direction 1 1  Write a sentence 1 1  Copy design 1 1  Total score 27 29   [place] Office Visit from 12/13/2022 in [facility]  PHQ-2 Total Score 0      Assessment/ Plan: Jasmin Butler here for annual physical exam.   Annual physical exam  Vitamin D deficiency - Plan: VITAMIN D 25 Hydroxy (Vit-D Deficiency, Fractures)  Primary hypertension - Plan: CMP14+EGFR, Lipid Panel  Vitamin B12 deficiency - Plan: CBC with Differential  Urticaria - Plan: famotidine (PEPCID) 20 MG tablet  Memory change  Hypothyroidism due to acquired atrophy of thyroid  UTD on preventative care.  Reviewed labs.  Ok to dc Mirtazapine and use Atarax prn since not having adverse events.    B12 give today. CBC stable  Pepcid PRN GERD/ urticaria  MMSE within normal range. Glad to continue monitoring annually given mother's history.  TSH/ FT4 added to previously collected labs.  Counseled on healthy lifestyle choices, including diet (rich in fruits, vegetables and lean meats and low in salt and simple carbohydrates) and exercise (at least 30 minutes of moderate physical activity daily).  Patient to follow up in 55mfor thyroid  Raife Lizer M. GLajuana Ripple DO

## 2022-12-12 LAB — LIPID PANEL
Chol/HDL Ratio: 3.3 ratio (ref 0.0–4.4)
Cholesterol, Total: 181 mg/dL (ref 100–199)
HDL: 55 mg/dL (ref >39)
LDL Chol Calc (NIH): 99 mg/dL (ref 0–99)
Triglycerides: 154 mg/dL — ABNORMAL HIGH (ref 0–149)
VLDL Cholesterol Cal: 27 mg/dL (ref 5–40)

## 2022-12-12 LAB — CBC WITH DIFFERENTIAL/PLATELET
Basophils Absolute: 0.1 10*3/uL (ref 0.0–0.2)
Basos: 2 %
EOS (ABSOLUTE): 0.1 10*3/uL (ref 0.0–0.4)
Eos: 2 %
Hematocrit: 38.6 % (ref 34.0–46.6)
Hemoglobin: 12.2 g/dL (ref 11.1–15.9)
Immature Grans (Abs): 0 10*3/uL (ref 0.0–0.1)
Immature Granulocytes: 0 %
Lymphocytes Absolute: 2 10*3/uL (ref 0.7–3.1)
Lymphs: 34 %
MCH: 26.9 pg (ref 26.6–33.0)
MCHC: 31.6 g/dL (ref 31.5–35.7)
MCV: 85 fL (ref 79–97)
Monocytes Absolute: 0.3 10*3/uL (ref 0.1–0.9)
Monocytes: 6 %
Neutrophils Absolute: 3.2 10*3/uL (ref 1.4–7.0)
Neutrophils: 56 %
Platelets: 260 10*3/uL (ref 150–450)
RBC: 4.54 x10E6/uL (ref 3.77–5.28)
RDW: 13.7 % (ref 11.7–15.4)
WBC: 5.7 10*3/uL (ref 3.4–10.8)

## 2022-12-12 LAB — CMP14+EGFR
ALT: 17 IU/L (ref 0–32)
AST: 26 IU/L (ref 0–40)
Albumin/Globulin Ratio: 1.9 (ref 1.2–2.2)
Albumin: 4.3 g/dL (ref 3.8–4.8)
Alkaline Phosphatase: 67 IU/L (ref 44–121)
BUN/Creatinine Ratio: 10 — ABNORMAL LOW (ref 12–28)
BUN: 8 mg/dL (ref 8–27)
Bilirubin Total: 0.3 mg/dL (ref 0.0–1.2)
CO2: 26 mmol/L (ref 20–29)
Calcium: 9.3 mg/dL (ref 8.7–10.3)
Chloride: 103 mmol/L (ref 96–106)
Creatinine, Ser: 0.77 mg/dL (ref 0.57–1.00)
Globulin, Total: 2.3 g/dL (ref 1.5–4.5)
Glucose: 92 mg/dL (ref 70–99)
Potassium: 4.5 mmol/L (ref 3.5–5.2)
Sodium: 143 mmol/L (ref 134–144)
Total Protein: 6.6 g/dL (ref 6.0–8.5)
eGFR: 81 mL/min/{1.73_m2} (ref >59)

## 2022-12-12 LAB — VITAMIN D 25 HYDROXY (VIT D DEFICIENCY, FRACTURES): Vit D, 25-Hydroxy: 30.3 ng/mL (ref 30.0–100.0)

## 2022-12-13 ENCOUNTER — Ambulatory Visit (INDEPENDENT_AMBULATORY_CARE_PROVIDER_SITE_OTHER): Payer: Medicare HMO | Admitting: Family Medicine

## 2022-12-13 ENCOUNTER — Encounter: Payer: Self-pay | Admitting: Family Medicine

## 2022-12-13 VITALS — BP 137/80 | HR 62 | Temp 98.6°F | Ht 63.0 in | Wt 147.0 lb

## 2022-12-13 DIAGNOSIS — I10 Essential (primary) hypertension: Secondary | ICD-10-CM | POA: Diagnosis not present

## 2022-12-13 DIAGNOSIS — E034 Atrophy of thyroid (acquired): Secondary | ICD-10-CM | POA: Diagnosis not present

## 2022-12-13 DIAGNOSIS — L509 Urticaria, unspecified: Secondary | ICD-10-CM | POA: Diagnosis not present

## 2022-12-13 DIAGNOSIS — E559 Vitamin D deficiency, unspecified: Secondary | ICD-10-CM | POA: Diagnosis not present

## 2022-12-13 DIAGNOSIS — Z0001 Encounter for general adult medical examination with abnormal findings: Secondary | ICD-10-CM | POA: Diagnosis not present

## 2022-12-13 DIAGNOSIS — R413 Other amnesia: Secondary | ICD-10-CM

## 2022-12-13 DIAGNOSIS — E538 Deficiency of other specified B group vitamins: Secondary | ICD-10-CM

## 2022-12-13 DIAGNOSIS — Z0000 Encounter for general adult medical examination without abnormal findings: Secondary | ICD-10-CM

## 2022-12-13 MED ORDER — FAMOTIDINE 20 MG PO TABS: 20.0000 mg | ORAL_TABLET | Freq: Two times a day (BID) | ORAL | 3 refills | Status: DC | PRN

## 2022-12-13 MED ORDER — EPINEPHRINE 0.3 MG/0.3ML IJ SOAJ: 0.3000 mg | INTRAMUSCULAR | 2 refills | Status: DC | PRN

## 2022-12-13 MED ORDER — LEVOTHYROXINE SODIUM 75 MCG PO TABS: 75.0000 ug | ORAL_TABLET | Freq: Every day | ORAL | 1 refills | Status: DC

## 2022-12-13 MED ORDER — ROSUVASTATIN CALCIUM 40 MG PO TABS: 40.0000 mg | ORAL_TABLET | ORAL | 3 refills | Status: DC

## 2022-12-13 NOTE — Addendum Note (Signed)
Addended by: [name] on: 12/13/2022 12:43 PM   Modules accepted: Orders, Level of Service

## 2022-12-16 LAB — SPECIMEN STATUS REPORT

## 2022-12-16 LAB — T4: T4, Total: 7.7 ug/dL (ref 4.5–12.0)

## 2022-12-16 LAB — TSH: TSH: 1.5 u[IU]/mL (ref 0.450–4.500)

## 2022-12-26 ENCOUNTER — Telehealth: Payer: Self-pay | Admitting: Family Medicine

## 2022-12-26 NOTE — Telephone Encounter (Signed)
Please advise on letter for [employer]

## 2022-12-27 NOTE — Telephone Encounter (Signed)
Left vm for cb

## 2022-12-27 NOTE — Telephone Encounter (Signed)
What is her ailment? She did not complain of debilitating arthritis/ imbalance/ shortness of breath or chest pain with activity during our last visit.

## 2023-01-13 ENCOUNTER — Other Ambulatory Visit: Payer: Medicare HMO

## 2023-01-13 ENCOUNTER — Ambulatory Visit (INDEPENDENT_AMBULATORY_CARE_PROVIDER_SITE_OTHER): Payer: Medicare HMO

## 2023-01-13 ENCOUNTER — Telehealth: Payer: Self-pay

## 2023-01-13 DIAGNOSIS — R5382 Chronic fatigue, unspecified: Secondary | ICD-10-CM | POA: Diagnosis not present

## 2023-01-13 DIAGNOSIS — E538 Deficiency of other specified B group vitamins: Secondary | ICD-10-CM | POA: Diagnosis not present

## 2023-01-13 DIAGNOSIS — R6889 Other general symptoms and signs: Secondary | ICD-10-CM | POA: Diagnosis not present

## 2023-01-13 NOTE — Telephone Encounter (Signed)
We can certainly CHECK her iron level to see if she has low iron.  Does she wish to come in and have this done?

## 2023-01-13 NOTE — Telephone Encounter (Signed)
Patient came in today for her B12 injection and states that she has felt very tried the last 3 days. She states that she feels tried a few days before the injection is due but it is getting worse and the last 3 days she has been extremely tried.  Patient wants to know if iron infusions would help her. Please review and advise

## 2023-01-13 NOTE — Progress Notes (Signed)
B12 injection 69mL given in right deltoid - patient tolerated well

## 2023-01-13 NOTE — Telephone Encounter (Signed)
Pt is alright to check iron levels. Future orders placed. Pt will come in today for labs only.

## 2023-01-14 LAB — CBC WITH DIFFERENTIAL/PLATELET
Basophils Absolute: 0.1 10*3/uL (ref 0.0–0.2)
Basos: 1 %
EOS (ABSOLUTE): 0.1 10*3/uL (ref 0.0–0.4)
Eos: 2 %
Hematocrit: 37.2 % (ref 34.0–46.6)
Hemoglobin: 11.7 g/dL (ref 11.1–15.9)
Immature Grans (Abs): 0 10*3/uL (ref 0.0–0.1)
Immature Granulocytes: 0 %
Lymphocytes Absolute: 2.2 10*3/uL (ref 0.7–3.1)
Lymphs: 35 %
MCH: 27 pg (ref 26.6–33.0)
MCHC: 31.5 g/dL (ref 31.5–35.7)
MCV: 86 fL (ref 79–97)
Monocytes Absolute: 0.4 10*3/uL (ref 0.1–0.9)
Monocytes: 6 %
Neutrophils Absolute: 3.5 10*3/uL (ref 1.4–7.0)
Neutrophils: 56 %
Platelets: 231 10*3/uL (ref 150–450)
RBC: 4.34 x10E6/uL (ref 3.77–5.28)
RDW: 14 % (ref 11.7–15.4)
WBC: 6.3 10*3/uL (ref 3.4–10.8)

## 2023-01-14 LAB — IRON,TIBC AND FERRITIN PANEL
Ferritin: 73 ng/mL (ref 15–150)
Iron Saturation: 20 % (ref 15–55)
Iron: 59 ug/dL (ref 27–139)
Total Iron Binding Capacity: 300 ug/dL (ref 250–450)
UIBC: 241 ug/dL (ref 118–369)

## 2023-01-15 ENCOUNTER — Encounter: Payer: Self-pay | Admitting: Family Medicine

## 2023-01-20 ENCOUNTER — Encounter (INDEPENDENT_AMBULATORY_CARE_PROVIDER_SITE_OTHER): Payer: Medicare HMO | Admitting: Family Medicine

## 2023-01-20 DIAGNOSIS — G479 Sleep disorder, unspecified: Secondary | ICD-10-CM

## 2023-01-20 DIAGNOSIS — F411 Generalized anxiety disorder: Secondary | ICD-10-CM

## 2023-01-20 MED ORDER — MIRTAZAPINE 15 MG PO TABS: 15.0000 mg | ORAL_TABLET | Freq: Every day | ORAL | 0 refills | Status: DC

## 2023-01-20 NOTE — Telephone Encounter (Signed)

## 2023-01-31 ENCOUNTER — Telehealth: Payer: Self-pay | Admitting: Family Medicine

## 2023-02-10 NOTE — Telephone Encounter (Signed)
Left vm for cb

## 2023-02-10 NOTE — Telephone Encounter (Signed)
We tested her for dementia at her visit and this was negative.  Has she suddenly had an acute change?  I cannot falsify her medical record to state she has dementia for the sake of getting her mailbox moved but if she has something else going on that prohibits her from making it to her mailbox, please let me know.

## 2023-02-12 ENCOUNTER — Ambulatory Visit (INDEPENDENT_AMBULATORY_CARE_PROVIDER_SITE_OTHER): Payer: Medicare HMO | Admitting: *Deleted

## 2023-02-12 DIAGNOSIS — E538 Deficiency of other specified B group vitamins: Secondary | ICD-10-CM | POA: Diagnosis not present

## 2023-02-12 NOTE — Progress Notes (Signed)
Pt in today for B12 injection, tolerated well.

## 2023-02-20 NOTE — Telephone Encounter (Signed)
I have not been able to get in touch with pt , closing call  If pt calls back we need more information

## 2023-02-21 ENCOUNTER — Telehealth: Payer: Self-pay | Admitting: Family Medicine

## 2023-02-21 NOTE — Telephone Encounter (Signed)
Appointment scheduled.

## 2023-02-24 ENCOUNTER — Ambulatory Visit (INDEPENDENT_AMBULATORY_CARE_PROVIDER_SITE_OTHER): Payer: Medicare HMO | Admitting: Family Medicine

## 2023-02-24 ENCOUNTER — Encounter: Payer: Self-pay | Admitting: Family Medicine

## 2023-02-24 VITALS — BP 161/79 | HR 59 | Temp 98.0°F | Ht 63.0 in | Wt 153.5 lb

## 2023-02-24 DIAGNOSIS — M62830 Muscle spasm of back: Secondary | ICD-10-CM

## 2023-02-24 DIAGNOSIS — M62838 Other muscle spasm: Secondary | ICD-10-CM

## 2023-02-24 DIAGNOSIS — R6889 Other general symptoms and signs: Secondary | ICD-10-CM | POA: Diagnosis not present

## 2023-02-24 MED ORDER — CYCLOBENZAPRINE HCL 5 MG PO TABS: 5.0000 mg | ORAL_TABLET | Freq: Three times a day (TID) | ORAL | 1 refills | Status: DC | PRN

## 2023-02-24 NOTE — Patient Instructions (Signed)
Leg Cramps Leg cramps occur when one or more muscles tighten and a person has no control over it (involuntary muscle contraction). Muscle cramps are most common in the calf muscles of the leg. They can occur during exercise or at rest. Leg cramps are painful, and they may last for a few seconds to a few minutes. Cramps may return several times before they finally stop. Usually, leg cramps are not caused by a serious medical problem. In many cases, the cause is not known. Some common causes include: Excessive physical effort (overexertion), such as during intense exercise. Doing the same motion over and over. Staying in a certain position for a long period of time. Improper preparation, form, or technique while doing a sport or an activity. Dehydration. Injury. Side effects of certain medicines. Abnormally low levels of minerals in your blood (electrolytes), especially potassium and calcium. This could result from: Pregnancy. Taking diuretic medicines. Follow these instructions at home: Eating and drinking Drink enough fluid to keep your urine pale yellow. Staying hydrated may help prevent cramps. Eat a healthy diet that includes plenty of nutrients to help your muscles function. A healthy diet includes fruits and vegetables, lean protein, whole grains, and low-fat or nonfat dairy products. Managing pain, stiffness, and swelling     Try massaging, stretching, and relaxing the affected muscle. Do this for several minutes at a time. If directed, put ice on areas that are sore or painful after a cramp. To do this: Put ice in a plastic bag. Place a towel between your skin and the bag. Leave the ice on for 20 minutes, 2-3 times a day. Remove the ice if your skin turns bright red. This is very important. If you cannot feel pain, heat, or cold, you have a greater risk of damage to the area. If directed, apply heat to muscles that are tense or tight. Do this before you exercise, or as often as told  by your health care provider. Use the heat source that your health care provider recommends, such as a moist heat pack or a heating pad. To do this: Place a towel between your skin and the heat source. Leave the heat on for 20-30 minutes. Remove the heat if your skin turns bright red. This is especially important if you are unable to feel pain, heat, or cold. You may have a greater risk of getting burned. Try taking hot showers or baths to help relax tight muscles. General instructions If you are having frequent leg cramps, avoid intense exercise for several days. Take over-the-counter and prescription medicines only as told by your health care provider. Keep all follow-up visits. This is important. Contact a health care provider if: Your leg cramps get more severe or more frequent, or they do not improve over time. Your foot becomes cold, numb, or blue. Summary Muscle cramps can develop in any muscle, but the most common place is in the calf muscles of the leg. Leg cramps are painful, and they may last for a few seconds to a few minutes. Usually, leg cramps are not caused by a serious medical problem. Often, the cause is not known. Stay hydrated, and take over-the-counter and prescription medicines only as told by your health care provider. This information is not intended to replace advice given to you by your health care provider. Make sure you discuss any questions you have with your health care provider. Document Revised: 02/16/2020 Document Reviewed: 02/16/2020 Elsevier Patient Education  2023 Elsevier Inc.  

## 2023-02-24 NOTE — Progress Notes (Signed)
   Acute Office Visit  Subjective:     Patient ID: Jasmin Butler, female    DOB: [birth year], 72 y.o.   MRN: [id number]  Chief Complaint  Patient presents with   Spasms    HPI Patient is in today for right lower leg pain. She reports tightness in this calf that feels like a knot for the last few weeks. This is mostly constant. It does improve slightly when she rests. Activity worsens symptoms. Denies numbness, tingling, decrease ROM, warmth, erythema, swelling. Denies coldness to extremities or changes in skin color. She denies symptoms in other leg. She has had this happen in the past and it self resolved. She has tried icy hot, tylenol, and aleve without improvement.   ROS As per HPI.      Objective:    BP (!) 161/79   Pulse (!) 59   Temp 98 F (36.7 C) (Temporal)   Ht 5\' 3"  (1.6 m)   Wt 153 lb 8 oz (69.6 kg)   SpO2 98%   BMI 27.19 kg/m    Physical Exam Vitals and nursing note reviewed.  Constitutional:      Appearance: Normal appearance.  Pulmonary:     Effort: Pulmonary effort is normal. No respiratory distress.  Musculoskeletal:     Right lower leg: No swelling, deformity, tenderness or bony tenderness. No edema.     Left lower leg: No swelling, deformity, tenderness or bony tenderness. No edema.     Right foot: Normal capillary refill. No swelling or tenderness. Normal pulse.     Left foot: Normal capillary refill. No swelling or tenderness. Normal pulse.  Skin:    General: Skin is warm and dry.  Neurological:     General: No focal deficit present.     Mental Status: She is alert and oriented to person, place, and time.  Psychiatric:        Mood and Affect: Mood normal.        Behavior: Behavior normal.     No results found for any visits on 02/24/23.      Assessment & Plan:   Miray was seen today for spasms.  Diagnoses and all orders for this visit:  Muscle spasms of lower extremity Right calf for a few weeks. Normal exam with 2+ pedal pulse and  brisk cap refill. Will check labs as below. Discussed hydration and stretching. Flexeril prn as below.  -     BMP8+EGFR -     Vitamin B12 -     Magnesium -     TSH -     cyclobenzaprine (FLEXERIL) 5 MG tablet; Take 1 tablet (5 mg total) by mouth 3 (three) times daily as needed for muscle spasms.  Return if symptoms worsen or fail to improve.  The patient indicates understanding of these issues and agrees with the plan.  Gabriel Earing, FNP

## 2023-02-26 LAB — TSH: TSH: 1.32 u[IU]/mL (ref 0.450–4.500)

## 2023-02-26 LAB — BMP8+EGFR
BUN/Creatinine Ratio: 10 — ABNORMAL LOW (ref 12–28)
BUN: 7 mg/dL — ABNORMAL LOW (ref 8–27)
CO2: 25 mmol/L (ref 20–29)
Calcium: 9.4 mg/dL (ref 8.7–10.3)
Chloride: 103 mmol/L (ref 96–106)
Creatinine, Ser: 0.69 mg/dL (ref 0.57–1.00)
Glucose: 120 mg/dL — ABNORMAL HIGH (ref 70–99)
Potassium: 4.4 mmol/L (ref 3.5–5.2)
Sodium: 141 mmol/L (ref 134–144)
eGFR: 92 mL/min/{1.73_m2} (ref >59)

## 2023-02-26 LAB — MAGNESIUM: Magnesium: 2.1 mg/dL (ref 1.6–2.3)

## 2023-02-26 LAB — VITAMIN B12: Vitamin B-12: 514 pg/mL (ref 232–1245)

## 2023-03-17 ENCOUNTER — Ambulatory Visit (INDEPENDENT_AMBULATORY_CARE_PROVIDER_SITE_OTHER): Payer: Medicare HMO

## 2023-03-17 DIAGNOSIS — E538 Deficiency of other specified B group vitamins: Secondary | ICD-10-CM

## 2023-03-17 NOTE — Progress Notes (Signed)
Cyanocobalamin injection given to right deltoid.  Patient tolerated well. 

## 2023-03-20 ENCOUNTER — Encounter: Payer: Self-pay | Admitting: Family Medicine

## 2023-03-21 ENCOUNTER — Other Ambulatory Visit: Payer: Self-pay | Admitting: Family Medicine

## 2023-03-21 DIAGNOSIS — F411 Generalized anxiety disorder: Secondary | ICD-10-CM

## 2023-03-21 DIAGNOSIS — G479 Sleep disorder, unspecified: Secondary | ICD-10-CM

## 2023-03-21 MED ORDER — MIRTAZAPINE 30 MG PO TABS: 30.0000 mg | ORAL_TABLET | Freq: Every day | ORAL | 3 refills | Status: DC

## 2023-03-21 MED ORDER — BUSPIRONE HCL 15 MG PO TABS: 15.0000 mg | ORAL_TABLET | Freq: Two times a day (BID) | ORAL | 3 refills | Status: DC

## 2023-03-31 ENCOUNTER — Encounter (INDEPENDENT_AMBULATORY_CARE_PROVIDER_SITE_OTHER): Payer: Medicare HMO | Admitting: Family Medicine

## 2023-03-31 DIAGNOSIS — M609 Myositis, unspecified: Secondary | ICD-10-CM

## 2023-03-31 DIAGNOSIS — E782 Mixed hyperlipidemia: Secondary | ICD-10-CM

## 2023-03-31 MED ORDER — EZETIMIBE 10 MG PO TABS: 10.0000 mg | ORAL_TABLET | Freq: Every day | ORAL | 3 refills | Status: DC

## 2023-03-31 NOTE — Telephone Encounter (Signed)

## 2023-04-15 ENCOUNTER — Ambulatory Visit (INDEPENDENT_AMBULATORY_CARE_PROVIDER_SITE_OTHER): Payer: Medicare HMO | Admitting: *Deleted

## 2023-04-15 DIAGNOSIS — E538 Deficiency of other specified B group vitamins: Secondary | ICD-10-CM

## 2023-04-15 NOTE — Progress Notes (Signed)
Vitamin b12 injection given and patient tolerated well.  

## 2023-05-16 ENCOUNTER — Ambulatory Visit: Payer: Medicare HMO

## 2023-05-16 DIAGNOSIS — E538 Deficiency of other specified B group vitamins: Secondary | ICD-10-CM | POA: Diagnosis not present

## 2023-05-16 NOTE — Progress Notes (Signed)
B12 injection given right deltoid patient tolerated well

## 2023-05-19 ENCOUNTER — Other Ambulatory Visit: Payer: Self-pay | Admitting: Family Medicine

## 2023-05-21 ENCOUNTER — Encounter: Payer: Self-pay | Admitting: Family Medicine

## 2023-06-12 ENCOUNTER — Other Ambulatory Visit: Payer: Medicare HMO

## 2023-06-12 DIAGNOSIS — M609 Myositis, unspecified: Secondary | ICD-10-CM | POA: Diagnosis not present

## 2023-06-12 DIAGNOSIS — T466X5A Adverse effect of antihyperlipidemic and antiarteriosclerotic drugs, initial encounter: Secondary | ICD-10-CM | POA: Diagnosis not present

## 2023-06-12 DIAGNOSIS — E782 Mixed hyperlipidemia: Secondary | ICD-10-CM | POA: Diagnosis not present

## 2023-06-13 ENCOUNTER — Encounter: Payer: Self-pay | Admitting: Family Medicine

## 2023-06-13 LAB — HEPATIC FUNCTION PANEL
ALT: 14 IU/L (ref 0–32)
AST: 26 IU/L (ref 0–40)
Albumin: 4 g/dL (ref 3.8–4.8)
Alkaline Phosphatase: 73 IU/L (ref 44–121)
Bilirubin Total: 0.3 mg/dL (ref 0.0–1.2)
Bilirubin, Direct: <0.1 mg/dL (ref 0.00–0.40)
Total Protein: 6.4 g/dL (ref 6.0–8.5)

## 2023-06-13 LAB — LIPID PANEL
Chol/HDL Ratio: 4.2 ratio (ref 0.0–4.4)
Cholesterol, Total: 213 mg/dL — ABNORMAL HIGH (ref 100–199)
HDL: 51 mg/dL (ref >39)
LDL Chol Calc (NIH): 136 mg/dL — ABNORMAL HIGH (ref 0–99)
Triglycerides: 143 mg/dL (ref 0–149)
VLDL Cholesterol Cal: 26 mg/dL (ref 5–40)

## 2023-06-17 ENCOUNTER — Encounter: Payer: Self-pay | Admitting: Family Medicine

## 2023-06-17 ENCOUNTER — Ambulatory Visit (INDEPENDENT_AMBULATORY_CARE_PROVIDER_SITE_OTHER): Payer: Medicare HMO | Admitting: Family Medicine

## 2023-06-17 ENCOUNTER — Ambulatory Visit: Payer: Medicare HMO

## 2023-06-17 VITALS — BP 129/81 | HR 79 | Temp 98.6°F | Ht 63.0 in | Wt 144.0 lb

## 2023-06-17 DIAGNOSIS — M609 Myositis, unspecified: Secondary | ICD-10-CM | POA: Diagnosis not present

## 2023-06-17 DIAGNOSIS — E782 Mixed hyperlipidemia: Secondary | ICD-10-CM

## 2023-06-17 DIAGNOSIS — T466X5D Adverse effect of antihyperlipidemic and antiarteriosclerotic drugs, subsequent encounter: Secondary | ICD-10-CM

## 2023-06-17 DIAGNOSIS — E538 Deficiency of other specified B group vitamins: Secondary | ICD-10-CM

## 2023-06-17 MED ORDER — ROSUVASTATIN CALCIUM 20 MG PO TABS: 20.0000 mg | ORAL_TABLET | Freq: Every day | ORAL | 3 refills | Status: DC

## 2023-06-17 NOTE — Progress Notes (Signed)
Subjective: WU:[id number] up HLD PCP: Raliegh Ip, DO YNW:GNFA Jasmin Butler is a 72 y.o. female presenting to clinic today for:  1. HLD  Wants to dc zetia and go back to rosuvastatin. Had myalgia previously. Could not afford Livalo.  Wants to try and use CoQ10 and maybe reduce strength.  No CP, SOB, abdominal pain.   ROS: Per HPI  Allergies  Allergen Reactions   Alpha-Gal    Penicillins Rash    Did it involve swelling of the face/tongue/throat, SOB, or low BP? Yes Did it involve sudden or severe rash/hives, skin peeling, or any reaction on the inside of your mouth or nose? Unk Did you need to seek medical attention at a hospital or doctor's office? Yes When did it last happen? 30 years ago If all above answers are "NO", may proceed with cephalosporin use.    Past Medical History:  Diagnosis Date   Allergy to alpha-gal    Anxiety    Cataract    Depression    "couple times/yr" (09/29/2014)   GERD (gastroesophageal reflux disease) 2005   Headache    "weekly" (09/29/2014)   History of hiatal hernia    Hyperlipidemia    IBS (irritable bowel syndrome)    Migraine    "2-3 times/yr" (09/29/2014)   Pernicious anemia    Pneumonia 1980's   "double"   Thyroid disease    Urticaria     Current Outpatient Medications:    busPIRone (BUSPAR) 15 MG tablet, Take 1 tablet (15 mg total) by mouth 2 (two) times daily. For anxiety, Disp: 180 tablet, Rfl: 3   cyanocobalamin (,VITAMIN B-12,) 1000 MCG/ML injection, Inject 1,000 mcg into the muscle every 30 (thirty) days. , Disp: , Rfl:    cyclobenzaprine (FLEXERIL) 5 MG tablet, Take 1 tablet (5 mg total) by mouth 3 (three) times daily as needed for muscle spasms., Disp: 30 tablet, Rfl: 1   EPINEPHrine (EPIPEN 2-PAK) 0.3 mg/0.3 mL IJ SOAJ injection, Inject 0.3 mg into the muscle as needed for anaphylaxis., Disp: 1 each, Rfl: 2   ezetimibe (ZETIA) 10 MG tablet, Take 1 tablet (10 mg total) by mouth daily., Disp: 90 tablet, Rfl: 3    famotidine (PEPCID) 20 MG tablet, Take 1 tablet (20 mg total) by mouth 2 (two) times daily as needed for heartburn or indigestion., Disp: 180 tablet, Rfl: 3   hydrOXYzine (ATARAX) 25 MG tablet, Take 0.5-1 tablets (12.5-25 mg total) by mouth every 8 (eight) hours as needed for anxiety., Disp: 270 tablet, Rfl: 0   levothyroxine (SYNTHROID) 75 MCG tablet, TAKE 1 TABLET BY MOUTH EVERY DAY, Disp: 90 tablet, Rfl: 2   linaclotide (LINZESS) 145 MCG CAPS capsule, Take 1 capsule (145 mcg total) by mouth daily before breakfast. Please inform that insurance will not cover Trulance, Disp: 90 capsule, Rfl: 3   mirtazapine (REMERON) 30 MG tablet, Take 1 tablet (30 mg total) by mouth at bedtime., Disp: 90 tablet, Rfl: 3  Current Facility-Administered Medications:    cyanocobalamin ((VITAMIN B-12)) injection 1,000 mcg, 1,000 mcg, Intramuscular, Q30 days, Delynn Flavin M, DO, 1,000 mcg at 05/16/23 2130 Social History   Socioeconomic History   Marital status: Widowed    Spouse name: Not on file   Number of children: 3   Years of education: GED   Highest education level: GED or equivalent  Occupational History   Occupation: Retired- Education officer, environmental  Tobacco Use   Smoking status: Never   Smokeless tobacco: Never  Building services engineer  status: Never Used  Substance and Sexual Activity   Alcohol use: No   Drug use: Never   Sexual activity: Not Currently  Other Topics Concern   Not on file  Social History Narrative   2 story home   Her son lives with her   Very independent   Does not believe in annual screenings   Social Determinants of Health   Financial Resource Strain: Low Risk  (02/21/2023)   Overall Financial Resource Strain (CARDIA)    Difficulty of Paying Living Expenses: Not hard at all  Food Insecurity: No Food Insecurity (02/21/2023)   Hunger Vital Sign    Worried About Running Out of Food in the Last Year: Never true    Ran Out of Food in the Last Year: Never true  Transportation Needs:  No Transportation Needs (02/21/2023)   PRAPARE - Administrator, Civil Service (Medical): No    Lack of Transportation (Non-Medical): No  Physical Activity: Sufficiently Active (02/21/2023)   Exercise Vital Sign    Days of Exercise per Week: 7 days    Minutes of Exercise per Session: 30 min  Stress: No Stress Concern Present (02/21/2023)   [name] of Occupational Health - Occupational Stress Questionnaire    Feeling of Stress : Not at all  Social Connections: Moderately Integrated (02/21/2023)   Social Connection and Isolation Panel [NHANES]    Frequency of Communication with Friends and Family: More than three times a week    Frequency of Social Gatherings with Friends and Family: Three times a week    Attends Religious Services: 1 to 4 times per year    Active Member of Clubs or Organizations: Yes    Attends Banker Meetings: 1 to 4 times per year    Marital Status: Widowed  Intimate Partner Violence: Not At Risk (09/03/2022)   Humiliation, Afraid, Rape, and Kick questionnaire    Fear of Current or Ex-Partner: No    Emotionally Abused: No    Physically Abused: No    Sexually Abused: No   Family History  Problem Relation Age of Onset   Leukemia Father    Cancer Father        leukemia   Hypertension Mother    Stroke Mother 59   Arthritis Mother    Cancer Mother    Cancer Brother        4 different cancers   Asthma Brother    Birth defects Son     Objective: Office vital signs reviewed. BP 129/81   Pulse 79   Temp 98.6 F (37 C)   Ht 5\' 3"  (1.6 m)   Wt 144 lb (65.3 kg)   SpO2 97%   BMI 25.51 kg/m   Physical Examination:  General: Awake, alert, well nourished, No acute distress HEENT: sclera white, MMM Cardio: regular rate and rhythm, S1S2 heard, no murmurs appreciated Pulm: clear to auscultation bilaterally, no wheezes, rhonchi or rales; normal work of breathing on room air    Assessment/ Plan: 72 y.o. female   Mixed  hyperlipidemia - Plan: rosuvastatin (CRESTOR) 20 MG tablet, Lipid panel, Hepatic function panel  Statin-induced myositis  Vitamin B12 deficiency  Crestor 20mg  to replace zetia.  Future order for FLP and LFT placed. Pt to return in 3 months for fasting labs.  Ok for CoQ10.    Vit B 12 administered.   Raliegh Ip, DO [facility] [phone]

## 2023-07-16 ENCOUNTER — Ambulatory Visit (INDEPENDENT_AMBULATORY_CARE_PROVIDER_SITE_OTHER): Payer: Medicare HMO | Admitting: *Deleted

## 2023-07-16 DIAGNOSIS — E538 Deficiency of other specified B group vitamins: Secondary | ICD-10-CM

## 2023-07-16 NOTE — Progress Notes (Signed)
Pt given B12 injection IM left deltoid and tolerated well. °

## 2023-07-17 DIAGNOSIS — H00025 Hordeolum internum left lower eyelid: Secondary | ICD-10-CM | POA: Diagnosis not present

## 2023-08-01 ENCOUNTER — Encounter: Payer: Self-pay | Admitting: Family Medicine

## 2023-08-05 ENCOUNTER — Encounter: Payer: Self-pay | Admitting: Family Medicine

## 2023-08-05 ENCOUNTER — Ambulatory Visit: Payer: Medicare HMO | Admitting: Family Medicine

## 2023-08-05 VITALS — BP 129/78 | HR 71 | Temp 98.6°F | Ht 63.0 in | Wt 145.0 lb

## 2023-08-05 DIAGNOSIS — R21 Rash and other nonspecific skin eruption: Secondary | ICD-10-CM | POA: Diagnosis not present

## 2023-08-05 DIAGNOSIS — L570 Actinic keratosis: Secondary | ICD-10-CM

## 2023-08-05 MED ORDER — NYSTATIN-TRIAMCINOLONE 100000-0.1 UNIT/GM-% EX CREA: TOPICAL_CREAM | CUTANEOUS | 0 refills | Status: DC

## 2023-08-05 NOTE — Progress Notes (Signed)
Subjective: CC: Scaly spots PCP: Raliegh Ip, DO [id number] Jasmin Butler is a 72 y.o. female presenting to clinic today for:  1.  Scaly spots Patient reports several scaly spots on her chest.  She has a couple of ones on her right forearm and elbow but those have been there a lot longer.  The ones on her chest have only been there about 2 weeks and are not itchy but are red and raised.  She denies any known exposures.  However, after further discussion she does admit that she was recently treated with doxycycline and that she was in the sun for quite a while after starting the medication.  This seems to coincide with the start of the rash  The lesions on her right forearm and elbow however scale and she picks them off but they always return.  She has needed cryoablation for actinic keratoses in the past and she asked for this today   ROS: Per HPI  Allergies  Allergen Reactions   Alpha-Gal    Penicillins Rash    Did it involve swelling of the face/tongue/throat, SOB, or low BP? Yes Did it involve sudden or severe rash/hives, skin peeling, or any reaction on the inside of your mouth or nose? Unk Did you need to seek medical attention at a hospital or doctor's office? Yes When did it last happen? 30 years ago If all above answers are "NO", may proceed with cephalosporin use.    Past Medical History:  Diagnosis Date   Allergy to alpha-gal    Anxiety    Cataract    Depression    "couple times/yr" (09/29/2014)   GERD (gastroesophageal reflux disease) 2005   Headache    "weekly" (09/29/2014)   History of hiatal hernia    Hyperlipidemia    IBS (irritable bowel syndrome)    Migraine    "2-3 times/yr" (09/29/2014)   Pernicious anemia    Pneumonia 1980's   "double"   Thyroid disease    Urticaria     Current Outpatient Medications:    busPIRone (BUSPAR) 15 MG tablet, Take 1 tablet (15 mg total) by mouth 2 (two) times daily. For anxiety, Disp: 180 tablet, Rfl: 3    cyanocobalamin (,VITAMIN B-12,) 1000 MCG/ML injection, Inject 1,000 mcg into the muscle every 30 (thirty) days. , Disp: , Rfl:    cyclobenzaprine (FLEXERIL) 5 MG tablet, Take 1 tablet (5 mg total) by mouth 3 (three) times daily as needed for muscle spasms., Disp: 30 tablet, Rfl: 1   EPINEPHrine (EPIPEN 2-PAK) 0.3 mg/0.3 mL IJ SOAJ injection, Inject 0.3 mg into the muscle as needed for anaphylaxis., Disp: 1 each, Rfl: 2   famotidine (PEPCID) 20 MG tablet, Take 1 tablet (20 mg total) by mouth 2 (two) times daily as needed for heartburn or indigestion., Disp: 180 tablet, Rfl: 3   levothyroxine (SYNTHROID) 75 MCG tablet, TAKE 1 TABLET BY MOUTH EVERY DAY, Disp: 90 tablet, Rfl: 2   linaclotide (LINZESS) 145 MCG CAPS capsule, Take 1 capsule (145 mcg total) by mouth daily before breakfast. Please inform that insurance will not cover Trulance, Disp: 90 capsule, Rfl: 3   mirtazapine (REMERON) 30 MG tablet, Take 1 tablet (30 mg total) by mouth at bedtime., Disp: 90 tablet, Rfl: 3   rosuvastatin (CRESTOR) 20 MG tablet, Take 1 tablet (20 mg total) by mouth daily., Disp: 90 tablet, Rfl: 3  Current Facility-Administered Medications:    cyanocobalamin ((VITAMIN B-12)) injection 1,000 mcg, 1,000 mcg, Intramuscular, Q30 days, Mackinze Criado,  Ronav Furney M, DO, 1,000 mcg at 07/16/23 4098 Social History   Socioeconomic History   Marital status: Widowed    Spouse name: Not on file   Number of children: 3   Years of education: GED   Highest education level: GED or equivalent  Occupational History   Occupation: Retired- Education officer, environmental  Tobacco Use   Smoking status: Never   Smokeless tobacco: Never  Vaping Use   Vaping status: Never Used  Substance and Sexual Activity   Alcohol use: No   Drug use: Never   Sexual activity: Not Currently  Other Topics Concern   Not on file  Social History Narrative   2 story home   Her son lives with her   Very independent   Does not believe in annual screenings   Social  Determinants of Health   Financial Resource Strain: Low Risk  (08/01/2023)   Overall Financial Resource Strain (CARDIA)    Difficulty of Paying Living Expenses: Not hard at all  Food Insecurity: No Food Insecurity (08/01/2023)   Hunger Vital Sign    Worried About Running Out of Food in the Last Year: Never true    Ran Out of Food in the Last Year: Never true  Transportation Needs: No Transportation Needs (08/01/2023)   PRAPARE - Administrator, Civil Service (Medical): No    Lack of Transportation (Non-Medical): No  Physical Activity: Insufficiently Active (08/01/2023)   Exercise Vital Sign    Days of Exercise per Week: 7 days    Minutes of Exercise per Session: 10 min  Stress: No Stress Concern Present (08/01/2023)   [name] of Occupational Health - Occupational Stress Questionnaire    Feeling of Stress : Only a little  Social Connections: Moderately Integrated (08/01/2023)   Social Connection and Isolation Panel [NHANES]    Frequency of Communication with Friends and Family: More than three times a week    Frequency of Social Gatherings with Friends and Family: More than three times a week    Attends Religious Services: 1 to 4 times per year    Active Member of [employer] or Organizations: Yes    Attends Banker Meetings: 1 to 4 times per year    Marital Status: Widowed  Intimate Partner Violence: Not At Risk (09/03/2022)   Humiliation, Afraid, Rape, and Kick questionnaire    Fear of Current or Ex-Partner: No    Emotionally Abused: No    Physically Abused: No    Sexually Abused: No   Family History  Problem Relation Age of Onset   Leukemia Father    Cancer Father        leukemia   Hypertension Mother    Stroke Mother 61   Arthritis Mother    Cancer Mother    Cancer Brother        4 different cancers   Asthma Brother    Birth defects Son     Objective: Office vital signs reviewed. BP 129/78   Pulse 71   Temp 98.6 F (37 C)   Ht 5'  3" (1.6 m)   Wt 145 lb (65.8 kg)   SpO2 95%   BMI 25.69 kg/m   Physical Examination:  General: Awake, alert, well nourished, No acute distress Skin: Maculopapular raised and erythematous rash appreciated along the chest that is in a V pattern.  There is no bleeding, pustules or vesicles appreciated.  She has scaly lesion that is less than 2 mm on the dorsal aspect  of the right forearm and on the extensor surface of the right elbow she has a similar patch of scaliness that is about a quarter of an inch by quarter of an inch in size.  Cryotherapy Procedure:  Risks and benefits of procedure were reviewed with the patient.  Written consent obtained and scanned into the chart.  Lesion of concern was identified and located on right forearm and right elbow.  Liquid nitrogen was applied to area of concern and extending out 1 millimeters beyond the border of the lesion.  Treated area was allowed to come back to room temperature before treating it a second time.  Patient tolerated procedure well and there were no immediate complications.  Home care instructions were reviewed with the patient and a handout was provided.  Assessment/ Plan: 72 y.o. female   Rash and nonspecific skin eruption - Plan: nystatin-triamcinolone (MYCOLOG II) cream  Actinic keratosis  I am going to give her nystatin triamcinolone cream though I do wonder if the rash on her chest is a direct result from skin exposure after use of doxycycline.  This seems to fit in honestly upon evaluation of this rash and has a perfect V where her V-neck T-shirt is  With regards to actinic keratosis lesion on her forearm and elbow were treated with cryoablation.  She tolerated this procedure without difficulty.  She will follow-up as needed this issue   Raliegh Ip, DO [facility] [phone]

## 2023-08-15 ENCOUNTER — Ambulatory Visit (INDEPENDENT_AMBULATORY_CARE_PROVIDER_SITE_OTHER): Payer: Medicare HMO

## 2023-08-15 DIAGNOSIS — E538 Deficiency of other specified B group vitamins: Secondary | ICD-10-CM

## 2023-08-15 NOTE — Progress Notes (Signed)
Cyanocobalamin injection given to right deltoid.  Patient tolerated well. 

## 2023-09-05 ENCOUNTER — Ambulatory Visit (INDEPENDENT_AMBULATORY_CARE_PROVIDER_SITE_OTHER): Payer: Medicare HMO

## 2023-09-05 VITALS — Ht 63.0 in | Wt 145.0 lb

## 2023-09-05 DIAGNOSIS — Z0000 Encounter for general adult medical examination without abnormal findings: Secondary | ICD-10-CM

## 2023-09-05 NOTE — Progress Notes (Signed)
 Because this visit was a virtual/telehealth visit,  certain criteria was not obtained, such a blood pressure, CBG if applicable, and timed get up and go. Any medications not marked as "taking" were not mentioned during the medication reconciliation part of the visit. Any vitals not documented were not able to be obtained due to this being a telehealth visit or patient was unable to self-report a recent blood pressure reading due to a lack of equipment at home via telehealth. Vitals that have been documented are verbally provided by the patient.   Subjective:   Jasmin Butler is a 72 y.o. female who presents for Medicare Annual (Subsequent) preventive examination.  Visit Complete: Virtual I connected with  Jasmin Butler on 09/05/23 by a audio enabled telemedicine application and verified that I am speaking with the correct person using two identifiers.  Patient Location: Home  Provider Location: Home Office  I discussed the limitations of evaluation and management by telemedicine. The patient expressed understanding and agreed to proceed.  Vital Signs: Because this visit was a virtual/telehealth visit, some criteria may be missing or patient reported. Any vitals not documented were not able to be obtained and vitals that have been documented are patient reported.  Patient Medicare AWV questionnaire was completed by the patient on 09/01/2023; I have confirmed that all information answered by patient is correct and no changes since this date.        Objective:    Today's Vitals   09/01/23 0836 09/05/23 1135  Weight:  145 lb (65.8 kg)  Height:  5\' 3"  (1.6 m)  PainSc: 0-No pain    Body mass index is 25.69 kg/m.     09/05/2023   11:35 AM 09/03/2022   10:36 AM 08/31/2021   10:47 AM 08/30/2020   10:45 AM 08/19/2020    3:32 PM 06/07/2020   10:14 PM 06/07/2020   12:21 PM  Advanced Directives  Does Patient Have a Medical Advance Directive? No No No No No  No  Would patient like  information on creating a medical advance directive? No - Patient declined No - Patient declined No - Patient declined No - Patient declined  No - Patient declined     Current Medications (verified) Outpatient Encounter Medications as of 09/05/2023  Medication Sig   busPIRone (BUSPAR) 15 MG tablet Take 1 tablet (15 mg total) by mouth 2 (two) times daily. For anxiety   cyanocobalamin (,VITAMIN B-12,) 1000 MCG/ML injection Inject 1,000 mcg into the muscle every 30 (thirty) days.    cyclobenzaprine (FLEXERIL) 5 MG tablet Take 1 tablet (5 mg total) by mouth 3 (three) times daily as needed for muscle spasms.   EPINEPHrine (EPIPEN 2-PAK) 0.3 mg/0.3 mL IJ SOAJ injection Inject 0.3 mg into the muscle as needed for anaphylaxis.   famotidine (PEPCID) 20 MG tablet Take 1 tablet (20 mg total) by mouth 2 (two) times daily as needed for heartburn or indigestion.   levothyroxine (SYNTHROID) 75 MCG tablet TAKE 1 TABLET BY MOUTH EVERY DAY   linaclotide (LINZESS) 145 MCG CAPS capsule Take 1 capsule (145 mcg total) by mouth daily before breakfast. Please inform that insurance will not cover Trulance   mirtazapine (REMERON) 30 MG tablet Take 1 tablet (30 mg total) by mouth at bedtime.   rosuvastatin (CRESTOR) 20 MG tablet Take 1 tablet (20 mg total) by mouth daily.   nystatin-triamcinolone (MYCOLOG II) cream Apply 0.5gm to affected areas twice daily x 10-14 days   Facility-Administered Encounter Medications as of 09/05/2023  Medication   cyanocobalamin ((VITAMIN B-12)) injection 1,000 mcg    Allergies (verified) Alpha-gal and Penicillins   History: Past Medical History:  Diagnosis Date   Allergy 1975   Penicillin   Allergy to alpha-gal    Anxiety    Arthritis 1995   Spirs in neck   Cataract    Depression    "couple times/yr" (09/29/2014)   GERD (gastroesophageal reflux disease) 2005   Headache    "weekly" (09/29/2014)   History of hiatal hernia    Hyperlipidemia    IBS (irritable bowel  syndrome)    Migraine    "2-3 times/yr" (09/29/2014)   Pernicious anemia    Pneumonia 1980's   "double"   Thyroid disease    Urticaria    Past Surgical History:  Procedure Laterality Date   ABDOMINAL HYSTERECTOMY  1985   CARDIAC CATHETERIZATION  ~ 2005   CATARACT EXTRACTION W/ INTRAOCULAR LENS  IMPLANT, BILATERAL Bilateral 2000's   CHOLECYSTECTOMY OPEN  ~ 1990   EYE SURGERY     TUBAL LIGATION  ~ 1983   Family History  Problem Relation Age of Onset   Leukemia Father    Cancer Father        leukemia   Hypertension Mother    Stroke Mother 41   Arthritis Mother    Cancer Mother    Cancer Brother        4 different cancers   Asthma Brother    Hearing loss Sister    Birth defects Son    Early death Son    Arthritis Brother    Cancer Daughter    Social History   Socioeconomic History   Marital status: Widowed    Spouse name: Not on file   Number of children: 3   Years of education: GED   Highest education level: GED or equivalent  Occupational History   Occupation: Retired- Education officer, environmental  Tobacco Use   Smoking status: Never   Smokeless tobacco: Never  Vaping Use   Vaping status: Never Used  Substance and Sexual Activity   Alcohol use: No   Drug use: Never   Sexual activity: Not Currently  Other Topics Concern   Not on file  Social History Narrative   2 story home   Her son lives with her   Very independent   Does not believe in annual screenings   Social Determinants of Health   Financial Resource Strain: Low Risk  (09/01/2023)   Overall Financial Resource Strain (CARDIA)    Difficulty of Paying Living Expenses: Not very hard  Food Insecurity: No Food Insecurity (09/01/2023)   Hunger Vital Sign    Worried About Running Out of Food in the Last Year: Never true    Ran Out of Food in the Last Year: Never true  Transportation Needs: No Transportation Needs (09/01/2023)   PRAPARE - Administrator, Civil Service (Medical): No    Lack of  Transportation (Non-Medical): No  Physical Activity: Sufficiently Active (09/01/2023)   Exercise Vital Sign    Days of Exercise per Week: 7 days    Minutes of Exercise per Session: 30 min  Recent Concern: Physical Activity - Insufficiently Active (08/01/2023)   Exercise Vital Sign    Days of Exercise per Week: 7 days    Minutes of Exercise per Session: 10 min  Stress: No Stress Concern Present (09/01/2023)   [name] of Occupational Health - Occupational Stress Questionnaire    Feeling of Stress : Only  a little  Social Connections: Moderately Integrated (09/01/2023)   Social Connection and Isolation Panel [NHANES]    Frequency of Communication with Friends and Family: More than three times a week    Frequency of Social Gatherings with Friends and Family: More than three times a week    Attends Religious Services: 1 to 4 times per year    Active Member of [employer] or Organizations: Yes    Attends Banker Meetings: 1 to 4 times per year    Marital Status: Widowed    Tobacco Counseling Counseling given: Yes   Clinical Intake:  Pre-visit preparation completed: Yes  Pain : No/denies pain Pain Score: 0-No pain     BMI - recorded: 25.69 Nutritional Status: BMI 25 -29 Overweight Nutritional Risks: None Diabetes: No  How often do you need to have someone help you when you read instructions, pamphlets, or other written materials from your doctor or pharmacy?: 1 - Never  Interpreter Needed?: No  Information entered by ::  Abdinasir Spadafore, CMA   Activities of Daily Living    09/01/2023    8:36 AM  In your present state of health, do you have any difficulty performing the following activities:  Hearing? 0  Vision? 0  Difficulty concentrating or making decisions? 0  Walking or climbing stairs? 0  Dressing or bathing? 0  Doing errands, shopping? 0  Preparing Food and eating ? N  Using the Toilet? N  In the past six months, have you accidently leaked urine?  N  Do you have problems with loss of bowel control? N  Managing your Medications? N  Managing your Finances? N  Housekeeping or managing your Housekeeping? N    Patient Care Team: Raliegh Ip, DO as PCP - General (Family Medicine) Rollene Rotunda, MD as PCP - Cardiology (Cardiology)  Indicate any recent Medical Services you may have received from other than [facility] providers in the past year (date may be approximate).     Assessment:   This is a routine wellness examination for [facility].  Hearing/Vision screen Hearing Screening - Comments:: Patient denies any hearing difficulties.   Vision Screening - Comments:: Up to date on yearly exams. Sees [facility] in [place]   Goals Addressed             This Visit's Progress    Patient Stated       Remain active and independent       Depression Screen    09/05/2023   11:37 AM 08/05/2023    7:58 AM 02/24/2023    1:19 PM 12/13/2022   11:07 AM 09/30/2022   10:46 AM 09/03/2022   10:35 AM 08/12/2022   12:33 PM  PHQ 2/9 Scores  PHQ - 2 Score 0 0 0 0 0 0 0  PHQ- 9 Score 0 0 0 0 0 0 0    Fall Risk    09/01/2023    8:36 AM 08/05/2023    7:58 AM 06/17/2023    8:23 AM 02/24/2023    1:19 PM 12/13/2022   11:07 AM  Fall Risk   Falls in the past year? 0 0 0 0 0  Number falls in past yr: 0 0 0  0  Injury with Fall? 0 0 0  0  Risk for fall due to : No Fall Risks No Fall Risks No Fall Risks  No Fall Risks  Follow up Falls prevention discussed Education provided Education provided  Education provided    MEDICARE RISK  AT HOME: Medicare Risk at Home Any stairs in or around the home?: Yes If so, are there any without handrails?: Yes Home free of loose throw rugs in walkways, pet beds, electrical Butler, etc?: Yes Adequate lighting in your home to reduce risk of falls?: Yes Life alert?: No Use of a cane, walker or w/c?: No Grab bars in the bathroom?: Yes Shower chair or bench in shower?: No Elevated toilet seat or a handicapped  toilet?: No  TIMED UP AND GO:  Was the test performed?  No    Cognitive Function:    12/13/2022   11:52 AM 06/19/2018   11:34 AM  MMSE - Mini Mental State Exam  Orientation to time 5 5  Orientation to Place 5 5  Registration 3 3  Attention/ Calculation 2 4  Recall 3 3  Language- name 2 objects 2 2  Language- repeat 1 1  Language- follow 3 step command 3 3  Language- read & follow direction 1 1  Write a sentence 1 1  Copy design 1 1  Total score 27 29        09/05/2023   11:36 AM 09/03/2022   10:36 AM 08/30/2020   10:46 AM 06/23/2019    9:28 AM  6CIT Screen  What Year? 0 points 0 points 0 points 0 points  What month? 0 points 0 points 0 points 0 points  What time? 0 points 0 points 0 points 0 points  Count back from 20 0 points 0 points 0 points 0 points  Months in reverse 0 points 0 points 0 points 0 points  Repeat phrase 0 points 0 points 0 points 0 points  Total Score 0 points 0 points 0 points 0 points    Immunizations Immunization History  Administered Date(s) Administered   Fluad Quad(high Dose 65+) 08/28/2016, 07/23/2017, 06/14/2019, 07/14/2020, 07/16/2021, 09/13/2022   Influenza, High Dose Seasonal PF 08/28/2016, 07/23/2017, 07/15/2018   Influenza,inj,Quad PF,6+ Mos 08/14/2020   Influenza-Unspecified 08/14/2014, 08/28/2016, 07/23/2017   Moderna Sars-Covid-2 Vaccination 11/18/2019   Pneumococcal Conjugate-13 12/12/2015   Pneumococcal Polysaccharide-23 02/27/2017   Tdap 06/19/2018   Zoster Recombinant(Shingrix) 02/13/2022, 05/16/2022    TDAP status: Up to date  Flu Vaccine status: Declined, Education has been provided regarding the importance of this vaccine but patient still declined. Advised may receive this vaccine at local pharmacy or Health Dept. Aware to provide a copy of the vaccination record if obtained from local pharmacy or Health Dept. Verbalized acceptance and understanding.  Pneumococcal vaccine status: Up to date  Covid-19 vaccine status:  Declined, Education has been provided regarding the importance of this vaccine but patient still declined. Advised may receive this vaccine at local pharmacy or Health Dept.or vaccine clinic. Aware to provide a copy of the vaccination record if obtained from local pharmacy or Health Dept. Verbalized acceptance and understanding.  Qualifies for Shingles Vaccine? Yes   Zostavax completed No   Shingrix Completed?: Yes  Screening Tests Health Maintenance  Topic Date Due   COVID-19 Vaccine (2 - Moderna risk series) 12/16/2019   Medicare Annual Wellness (AWV)  09/04/2023   INFLUENZA VACCINE  01/12/2024 (Originally 05/15/2023)   MAMMOGRAM  08/04/2024 (Originally 08/04/1999)   DEXA SCAN  08/04/2024 (Originally 08/03/2014)   Fecal DNA (Cologuard)  02/04/2025   DTaP/Tdap/Td (2 - Td or Tdap) 06/19/2028   Pneumonia Vaccine 11+ Years old  Completed   Hepatitis C Screening  Completed   Zoster Vaccines- Shingrix  Completed   HPV VACCINES  Aged Out  Health Maintenance  Health Maintenance Due  Topic Date Due   COVID-19 Vaccine (2 - Moderna risk series) 12/16/2019   Medicare Annual Wellness (AWV)  09/04/2023    Colorectal cancer screening: Type of screening: Cologuard. Completed 02/04/2022. Repeat every 3 years  Mammogram Status: Patient declined mammogram  Bone Density Status:Patient declined bone density screening   Lung Cancer Screening: (Low Dose CT Chest recommended if Age 57-80 years, 20 pack-year currently smoking OR have quit w/in 15years.) does not qualify.   Lung Cancer Screening Referral: na  Additional Screening:  Hepatitis C Screening: does not qualify; Completed   Vision Screening: Recommended annual ophthalmology exams for early detection of glaucoma and other disorders of the eye. Is the patient up to date with their annual eye exam?  Yes  Who is the provider or what is the name of the office in which the patient attends annual eye exams? [facility] [place] If pt is  not established with a provider, would they like to be referred to a provider to establish care? No .   Dental Screening: Recommended annual dental exams for proper oral hygiene  Diabetic Foot Exam: na  Community Resource Referral / Chronic Care Management: CRR required this visit?  No   CCM required this visit?  No     Plan:     I have personally reviewed and noted the following in the patient's chart:   Medical and social history Use of alcohol, tobacco or illicit drugs  Current medications and supplements including opioid prescriptions. Patient is not currently taking opioid prescriptions. Functional ability and status Nutritional status Physical activity Advanced directives List of other physicians Hospitalizations, surgeries, and ER visits in previous 12 months Vitals Screenings to include cognitive, depression, and falls Referrals and appointments  In addition, I have reviewed and discussed with patient certain preventive protocols, quality metrics, and best practice recommendations. A written personalized care plan for preventive services as well as general preventive health recommendations were provided to patient.     Jordan Hawks Deyona Soza, CMA   09/05/2023   After Visit Summary: (MyChart) Due to this being a telephonic visit, the after visit summary with patients personalized plan was offered to patient via MyChart   Nurse Notes: see routing comment

## 2023-09-05 NOTE — Patient Instructions (Signed)
Jasmin Butler , Thank you for taking time to come for your Medicare Wellness Visit. I appreciate your ongoing commitment to your health goals. Please review the following plan we discussed and let me know if I can assist you in the future.   Referrals/Orders/Follow-Ups/Clinician Recommendations:  Your next medicare annual wellness visit will be on September 07, 2024 at 10:40am. This will be a virtual visit    This is a list of the screening recommended for you and due dates:  Health Maintenance  Topic Date Due   COVID-19 Vaccine (2 - Moderna risk series) 12/16/2019   Flu Shot  01/12/2024*   Mammogram  08/04/2024*   DEXA scan (bone density measurement)  08/04/2024*   Medicare Annual Wellness Visit  09/04/2024   Cologuard (Stool DNA test)  02/04/2025   DTaP/Tdap/Td vaccine (2 - Td or Tdap) 06/19/2028   Pneumonia Vaccine  Completed   Hepatitis C Screening  Completed   Zoster (Shingles) Vaccine  Completed   HPV Vaccine  Aged Out  *Topic was postponed. The date shown is not the original due date.    Advanced directives: (In Chart) A copy of your advanced directives are scanned into your chart should your provider ever need it.  Next Medicare Annual Wellness Visit scheduled for next year: Yes  Preventive Care 77 Years and Older, Female Preventive care refers to lifestyle choices and visits with your health care provider that can promote health and wellness. Preventive care visits are also called wellness exams. What can I expect for my preventive care visit? Counseling Your health care provider may ask you questions about your: Medical history, including: Past medical problems. Family medical history. Pregnancy and menstrual history. History of falls. Current health, including: Memory and ability to understand (cognition). Emotional well-being. Home life and relationship well-being. Sexual activity and sexual health. Lifestyle, including: Alcohol, nicotine or tobacco, and drug  use. Access to firearms. Diet, exercise, and sleep habits. Work and work Astronomer. Sunscreen use. Safety issues such as seatbelt and bike helmet use. Physical exam Your health care provider will check your: Height and weight. These may be used to calculate your BMI (body mass index). BMI is a measurement that tells if you are at a healthy weight. Waist circumference. This measures the distance around your waistline. This measurement also tells if you are at a healthy weight and may help predict your risk of certain diseases, such as type 2 diabetes and high blood pressure. Heart rate and blood pressure. Body temperature. Skin for abnormal spots. What immunizations do I need?  Vaccines are usually given at various ages, according to a schedule. Your health care provider will recommend vaccines for you based on your age, medical history, and lifestyle or other factors, such as travel or where you work. What tests do I need? Screening Your health care provider may recommend screening tests for certain conditions. This may include: Lipid and cholesterol levels. Hepatitis C test. Hepatitis B test. HIV (human immunodeficiency virus) test. STI (sexually transmitted infection) testing, if you are at risk. Lung cancer screening. Colorectal cancer screening. Diabetes screening. This is done by checking your blood sugar (glucose) after you have not eaten for a while (fasting). Mammogram. Talk with your health care provider about how often you should have regular mammograms. BRCA-related cancer screening. This may be done if you have a family history of breast, ovarian, tubal, or peritoneal cancers. Bone density scan. This is done to screen for osteoporosis. Talk with your health care provider about your  test results, treatment options, and if necessary, the need for more tests. Follow these instructions at home: Eating and drinking  Eat a diet that includes fresh fruits and vegetables,  whole grains, lean protein, and low-fat dairy products. Limit your intake of foods with high amounts of sugar, saturated fats, and salt. Take vitamin and mineral supplements as recommended by your health care provider. Do not drink alcohol if your health care provider tells you not to drink. If you drink alcohol: Limit how much you have to 0-1 drink a day. Know how much alcohol is in your drink. In the [place] one drink equals one 12 oz bottle of beer (355 mL), one 5 oz glass of wine (148 mL), or one 1 oz glass of hard liquor (44 mL). Lifestyle Brush your teeth every morning and night with fluoride toothpaste. Floss one time each day. Exercise for at least 30 minutes 5 or more days each week. Do not use any products that contain nicotine or tobacco. These products include cigarettes, chewing tobacco, and vaping devices, such as e-cigarettes. If you need help quitting, ask your health care provider. Do not use drugs. If you are sexually active, practice safe sex. Use a condom or other form of protection in order to prevent STIs. Take aspirin only as told by your health care provider. Make sure that you understand how much to take and what form to take. Work with your health care provider to find out whether it is safe and beneficial for you to take aspirin daily. Ask your health care provider if you need to take a cholesterol-lowering medicine (statin). Find healthy ways to manage stress, such as: Meditation, yoga, or listening to music. Journaling. Talking to a trusted person. Spending time with friends and family. Minimize exposure to UV radiation to reduce your risk of skin cancer. Safety Always wear your seat belt while driving or riding in a vehicle. Do not drive: If you have been drinking alcohol. Do not ride with someone who has been drinking. When you are tired or distracted. While texting. If you have been using any mind-altering substances or drugs. Wear a helmet and other  protective equipment during sports activities. If you have firearms in your house, make sure you follow all gun safety procedures. What's next? Visit your health care provider once a year for an annual wellness visit. Ask your health care provider how often you should have your eyes and teeth checked. Stay up to date on all vaccines. This information is not intended to replace advice given to you by your health care provider. Make sure you discuss any questions you have with your health care provider. Document Revised: 03/28/2021 Document Reviewed: 03/28/2021 Elsevier Patient Education  2024 [employer]. Understanding Your Risk for Falls Millions of people have serious injuries from falls each year. It is important to understand your risk of falling. Talk with your health care provider about your risk and what you can do to lower it. If you do have a serious fall, make sure to tell your provider. Falling once raises your risk of falling again. How can falls affect me? Serious injuries from falls are common. These include: Broken bones, such as hip fractures. Head injuries, such as traumatic brain injuries (TBI) or concussions. A fear of falling can cause you to avoid activities and stay at home. This can make your muscles weaker and raise your risk for a fall. What can increase my risk? There are a number of risk factors that  increase your risk for falling. The more risk factors you have, the higher your risk of falling. Serious injuries from a fall happen most often to people who are older than 74 years old. Teenagers and young adults ages 92-29 are also at higher risk. Common risk factors include: Weakness in the lower body. Being generally weak or confused due to long-term (chronic) illness. Dizziness or balance problems. Poor vision. Medicines that cause dizziness or drowsiness. These may include: Medicines for your blood pressure, heart, anxiety, insomnia, or swelling (edema). Pain  medicines. Muscle relaxants. Other risk factors include: Drinking alcohol. Having had a fall in the past. Having foot pain or wearing improper footwear. Working at a dangerous job. Having any of the following in your home: Tripping hazards, such as floor clutter or loose rugs. Poor lighting. Pets. Having dementia or memory loss. What actions can I take to lower my risk of falling?     Physical activity Stay physically fit. Do strength and balance exercises. Consider taking a regular class to build strength and balance. Yoga and tai chi are good options. Vision Have your eyes checked every year and your prescription for glasses or contacts updated as needed. Shoes and walking aids Wear non-skid shoes. Wear shoes that have rubber soles and low heels. Do not wear high heels. Do not walk around the house in socks or slippers. Use a cane or walker as told by your provider. Home safety Attach secure railings on both sides of your stairs. Install grab bars for your bathtub, shower, and toilet. Use a non-skid mat in your bathtub or shower. Attach bath mats securely with double-sided, non-slip rug tape. Use good lighting in all rooms. Keep a flashlight near your bed. Make sure there is a clear path from your bed to the bathroom. Use night-lights. Do not use throw rugs. Make sure all carpeting is taped or tacked down securely. Remove all clutter from walkways and stairways, including extension cords. Repair uneven or broken steps and floors. Avoid walking on icy or slippery surfaces. Walk on the grass instead of on icy or slick sidewalks. Use ice melter to get rid of ice on walkways in the winter. Use a cordless phone. Questions to ask your health care provider Can you help me check my risk for a fall? Do any of my medicines make me more likely to fall? Should I take a vitamin D supplement? What exercises can I do to improve my strength and balance? Should I make an appointment to have  my vision checked? Do I need a bone density test to check for weak bones (osteoporosis)? Would it help to use a cane or a walker? Where to find more information Centers for Disease Control and Prevention, STEADI: TonerPromos.no Community-Based Fall Prevention Programs: TonerPromos.no [employer] on Aging: BaseRingTones.pl Contact a health care provider if: You fall at home. You are afraid of falling at home. You feel weak, drowsy, or dizzy. This information is not intended to replace advice given to you by your health care provider. Make sure you discuss any questions you have with your health care provider. Document Revised: 06/03/2022 Document Reviewed: 06/03/2022 Elsevier Patient Education  2024 [employer].

## 2023-09-15 ENCOUNTER — Ambulatory Visit (INDEPENDENT_AMBULATORY_CARE_PROVIDER_SITE_OTHER): Payer: Medicare HMO

## 2023-09-15 DIAGNOSIS — E538 Deficiency of other specified B group vitamins: Secondary | ICD-10-CM

## 2023-09-15 NOTE — Progress Notes (Signed)
Cyanocobalamin injection given to left deltoid.  Patient tolerated well. 

## 2023-09-16 ENCOUNTER — Other Ambulatory Visit: Payer: Medicare HMO

## 2023-09-16 DIAGNOSIS — E782 Mixed hyperlipidemia: Secondary | ICD-10-CM | POA: Diagnosis not present

## 2023-09-17 LAB — HEPATIC FUNCTION PANEL
ALT: 11 [IU]/L (ref 0–32)
AST: 19 [IU]/L (ref 0–40)
Albumin: 4.6 g/dL (ref 3.8–4.8)
Alkaline Phosphatase: 72 [IU]/L (ref 44–121)
Bilirubin Total: 0.3 mg/dL (ref 0.0–1.2)
Bilirubin, Direct: 0.11 mg/dL (ref 0.00–0.40)
Total Protein: 6.7 g/dL (ref 6.0–8.5)

## 2023-09-17 LAB — LIPID PANEL
Cholesterol, Total: 158 mg/dL (ref 100–199)
HDL: 55 mg/dL (ref >39)
LDL CALC COMMENT:: 2.9 ratio (ref 0.0–4.4)
LDL Chol Calc (NIH): 76 mg/dL (ref 0–99)
Triglycerides: 157 mg/dL — ABNORMAL HIGH (ref 0–149)
VLDL Cholesterol Cal: 27 mg/dL (ref 5–40)

## 2023-10-17 ENCOUNTER — Ambulatory Visit (INDEPENDENT_AMBULATORY_CARE_PROVIDER_SITE_OTHER): Payer: Medicare HMO | Admitting: *Deleted

## 2023-10-17 DIAGNOSIS — E538 Deficiency of other specified B group vitamins: Secondary | ICD-10-CM | POA: Diagnosis not present

## 2023-10-17 NOTE — Progress Notes (Signed)
 Patient in today for her monthly B12 injection. Tolerated well.

## 2023-11-14 ENCOUNTER — Ambulatory Visit (INDEPENDENT_AMBULATORY_CARE_PROVIDER_SITE_OTHER): Payer: Medicare HMO | Admitting: Family Medicine

## 2023-11-14 DIAGNOSIS — E538 Deficiency of other specified B group vitamins: Secondary | ICD-10-CM | POA: Diagnosis not present

## 2023-11-18 ENCOUNTER — Telehealth: Payer: Self-pay

## 2023-11-18 NOTE — Telephone Encounter (Signed)
This was an error

## 2023-11-21 ENCOUNTER — Encounter: Payer: Self-pay | Admitting: Family Medicine

## 2023-11-21 ENCOUNTER — Ambulatory Visit (INDEPENDENT_AMBULATORY_CARE_PROVIDER_SITE_OTHER): Payer: Medicare HMO | Admitting: Family

## 2023-11-21 VITALS — BP 140/77 | HR 65 | Temp 98.4°F | Wt 148.0 lb

## 2023-11-21 DIAGNOSIS — L239 Allergic contact dermatitis, unspecified cause: Secondary | ICD-10-CM

## 2023-11-21 MED ORDER — HYDROXYZINE PAMOATE 25 MG PO CAPS: 25.0000 mg | ORAL_CAPSULE | Freq: Three times a day (TID) | ORAL | 0 refills | Status: DC | PRN

## 2023-11-21 MED ORDER — METHYLPREDNISOLONE ACETATE 80 MG/ML IJ SUSP
80.0000 mg | Freq: Once | INTRAMUSCULAR | Status: AC
  Administered 2023-11-21: 80 mg via INTRAMUSCULAR

## 2023-11-21 NOTE — Patient Instructions (Signed)
 Contact Dermatitis Dermatitis is redness, soreness, and swelling (inflammation) of the skin. Contact dermatitis is a reaction to certain substances that touch the skin. There are two types of this condition: Irritant contact dermatitis. This is the most common type. It happens when something irritates your skin, such as when your hands get dry from washing them too often with soap. You can get this type of reaction even if you have not been exposed to the irritant before. Allergic contact dermatitis. This type is caused by a substance that you are allergic to, such as poison ivy. It occurs when you have been exposed to the substance (allergen) and form a sensitivity to it. In some cases, the reaction may start soon after your first exposure to the allergen. In other cases, it may not start until you are exposed to the allergen again. It may then occur every time you are exposed to the allergen in the future. What are the causes? Irritant contact dermatitis is often caused by exposure to: Makeup. Soaps, detergents, and bleaches. Acids. Metal salts, such as nickel. Allergic contact dermatitis is often caused by exposure to: Poisonous plants. Chemicals. Jewelry. Latex. Medicines. Preservatives in products, such as clothes. What increases the risk? You are more likely to get this condition if you have: A job that exposes you to irritants or allergens. Certain medical conditions. These include asthma and eczema. What are the signs or symptoms? Symptoms of this condition may occur in any place on your body that has been touched by the irritant. Symptoms include: Dryness, flaking, or cracking. Redness. Itching. Pain or a burning feeling. Blisters. Drainage of small amounts of blood or clear fluid from skin cracks. With allergic contact dermatitis, there may also be swelling in areas such as the eyelids, mouth, or genitals. How is this diagnosed? This condition is diagnosed with a medical  history and physical exam. A patch skin test may be done to help figure out the cause. If the condition is related to your job, you may need to see an expert in health problems in the workplace (occupational medicine specialist). How is this treated? This condition is treated by staying away from the cause of the reaction and protecting your skin from further contact. Treatment may also include: Steroid creams or ointments. Steroid medicines may need be taken by mouth (orally) in more severe cases. Antibiotics or medicines applied to the skin to kill bacteria (antibacterial ointments). These may be needed if a skin infection is present. Antihistamines. These may be taken orally or put on as a lotion to ease itching. A bandage (dressing). Follow these instructions at home: Skin care Moisturize your skin as needed. Put cool, wet cloths (cool compresses) on the affected areas. Try applying baking soda paste to your skin. Stir water into baking soda until it has the consistency of a paste. Do not scratch your skin. Avoid friction to the affected area. Avoid the use of soaps, perfumes, and dyes. Check the affected areas every day for signs of infection. Check for: More redness, swelling, or pain. More fluid or blood. Warmth. Pus or a bad smell. Medicines Take or apply over-the-counter and prescription medicines only as told by your health care provider. If you were prescribed antibiotics, take or apply them as told by your health care provider. Do not stop using the antibiotic even if you start to feel better. Bathing Try taking a bath with: Epsom salts. Follow the instructions on the packaging. You can get these at your local pharmacy  or grocery store. Baking soda. Pour a small amount into the bath as told by your health care provider. Colloidal oatmeal. Follow the instructions on the packaging. You can get this at your local pharmacy or grocery store. Bathe less often. This may mean bathing  every other day. Bathe in lukewarm water. Avoid using hot water. Bandage care If you were given a dressing, change it as told by your health care provider. Wash your hands with soap and water for at least 20 seconds before and after you change your dressing. If soap and water are not available, use hand sanitizer. General instructions Avoid the substance that caused your reaction. If you do not know what caused it, keep a journal to try to track what caused it. Write down: What you eat and drink. What cosmetics you use. What you wear in the affected area. This includes jewelry. Contact a health care provider if: Your condition does not get better with treatment. Your condition gets worse. You have any signs of infection. You have a fever. You have new symptoms. Your bone or joint under the affected area becomes painful after the skin has healed. Get help right away if: You notice red streaks coming from the affected area. The affected area turns darker. You have trouble breathing. This information is not intended to replace advice given to you by your health care provider. Make sure you discuss any questions you have with your health care provider. Document Revised: 04/05/2022 Document Reviewed: 04/05/2022 Elsevier Patient Education  2024 [employer].

## 2023-11-21 NOTE — Progress Notes (Signed)
 Subjective:    Patient ID: Jasmin Butler, female    DOB: 02/03/1951, 73 y.o.   MRN: [id number]  Chief Complaint  Patient presents with   Urticaria    Started throughout the night , goes up neck and face    PT presents to the office today with a rash on chest and face that started 11 pm last night. 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 She ate [employer] last night, but it was chicken, but did have cheese. Took two benadryl  without relief.  Rash This is a new problem. The current episode started yesterday. The problem is unchanged. The affected locations include the face and chest. The rash is characterized by itchiness and redness. Pertinent negatives include no facial edema, fatigue or shortness of breath.      Review of Systems  Constitutional:  Negative for fatigue.  Respiratory:  Negative for shortness of breath.   Skin:  Positive for rash.  All other systems reviewed and are negative.   Social History   Socioeconomic History   Marital status: Widowed    Spouse name: Not on file   Number of children: 3   Years of education: GED   Highest education level: GED or equivalent  Occupational History   Occupation: Retired- education officer, environmental  Tobacco Use   Smoking status: Never   Smokeless tobacco: Never  Vaping Use   Vaping status: Never Used  Substance and Sexual Activity   Alcohol use: No   Drug use: Never   Sexual activity: Not Currently  Other Topics Concern   Not on file  Social History Narrative   2 story home   Her son lives with her   Very independent   Does not believe in annual screenings   Social Drivers of Butler   Financial Resource Strain: Low Risk  (11/21/2023)   Overall Financial Resource Strain (CARDIA)    Difficulty of Paying Living Expenses: Not very hard  Food Insecurity: No Food Insecurity (11/21/2023)   Hunger Vital Sign    Worried About Running Out of Food in the Last Year: Never true    Ran Out of Food in the Last Year: Never true  Transportation Needs:  No Transportation Needs (11/21/2023)   PRAPARE - Administrator, Civil Service (Medical): No    Lack of Transportation (Non-Medical): No  Physical Activity: Sufficiently Active (11/21/2023)   Exercise Vital Sign    Days of Exercise per Week: 7 days    Minutes of Exercise per Session: 60 min  Stress: No Stress Concern Present (11/21/2023)   Jasmin Butler - Occupational Stress Questionnaire    Feeling of Stress : Only a little  Social Connections: Moderately Integrated (11/21/2023)   Social Connection and Isolation Panel [NHANES]    Frequency of Communication with Friends and Family: More than three times a week    Frequency of Social Gatherings with Friends and Family: Twice a week    Attends Religious Services: 1 to 4 times per year    Active Member of [employer] or Organizations: Yes    Attends Banker Meetings: 1 to 4 times per year    Marital Status: Widowed   Family History  Problem Relation Age of Onset   Leukemia Father    Cancer Father        leukemia   Hypertension Mother    Stroke Mother 57   Arthritis Mother    Cancer Mother    Cancer Brother  4 different cancers   Asthma Brother    Hearing loss Sister    Birth defects Son    Early death Son    Arthritis Brother    Cancer Daughter         Objective:   Physical Exam Vitals reviewed.  Constitutional:      General: She is not in acute distress.    Appearance: She is well-developed.  HENT:     Head: Normocephalic and atraumatic.  Eyes:     Pupils: Pupils are equal, round, and reactive to light.  Neck:     Thyroid : No thyromegaly.  Cardiovascular:     Rate and Rhythm: Normal rate and regular rhythm.     Heart sounds: Normal heart sounds. No murmur heard. Pulmonary:     Effort: Pulmonary effort is normal. No respiratory distress.     Breath sounds: Normal breath sounds. No wheezing.  Abdominal:     General: Bowel sounds are normal. There is no distension.      Palpations: Abdomen is soft.     Tenderness: There is no abdominal tenderness.  Musculoskeletal:        General: No tenderness. Normal range of motion.     Cervical back: Normal range of motion and neck supple.  Skin:    General: Skin is warm and dry.     Findings: Erythema present.          Comments: Erythemas on face, neck, and around neck  Neurological:     Mental Status: She is alert and oriented to person, place, and time.     Cranial Nerves: No cranial nerve deficit.     Deep Tendon Reflexes: Reflexes are normal and symmetric.  Psychiatric:        Behavior: Behavior normal.        Thought Content: Thought content normal.        Judgment: Judgment normal.       BP (!) 140/77   Pulse 65   Temp 98.4 F (36.9 C)   Wt 148 lb (67.1 kg)   SpO2 95%   BMI 26.22 kg/m      Assessment & Plan:  Lexxus Underhill comes in today with chief complaint of Urticaria (Started throughout the night , goes up neck and face )   Diagnosis and orders addressed:  1. Allergic contact dermatitis, unspecified trigger (Primary) Given pattern of rash of outside shirt, I feel this she has be in contact with something to cause an allergic reaction.  Benadryl  as needed Cool cloths  Follow up if symptoms worsen or do not improve  - methylPREDNISolone  acetate (DEPO-MEDROL ) injection 80 mg - hydrOXYzine  (VISTARIL ) 25 MG capsule; Take 1 capsule (25 mg total) by mouth every 8 (eight) hours as needed.  Dispense: 30 capsule; Refill: 0   [name], FNP

## 2023-12-15 ENCOUNTER — Ambulatory Visit (INDEPENDENT_AMBULATORY_CARE_PROVIDER_SITE_OTHER): Payer: Medicare HMO

## 2023-12-15 DIAGNOSIS — E538 Deficiency of other specified B group vitamins: Secondary | ICD-10-CM

## 2023-12-15 NOTE — Progress Notes (Signed)
 Patient is in office today for a nurse visit for B12 Injection. Patient Injection was given in the  Right deltoid. Patient tolerated injection well.

## 2023-12-16 ENCOUNTER — Encounter: Payer: Self-pay | Admitting: Family Medicine

## 2023-12-16 ENCOUNTER — Ambulatory Visit: Payer: Medicare HMO | Admitting: Family Medicine

## 2023-12-16 VITALS — BP 125/72 | HR 63 | Temp 97.5°F | Ht 63.0 in | Wt 148.0 lb

## 2023-12-16 DIAGNOSIS — Z0001 Encounter for general adult medical examination with abnormal findings: Secondary | ICD-10-CM

## 2023-12-16 DIAGNOSIS — E034 Atrophy of thyroid (acquired): Secondary | ICD-10-CM

## 2023-12-16 DIAGNOSIS — E782 Mixed hyperlipidemia: Secondary | ICD-10-CM

## 2023-12-16 DIAGNOSIS — D510 Vitamin B12 deficiency anemia due to intrinsic factor deficiency: Secondary | ICD-10-CM

## 2023-12-16 DIAGNOSIS — Z87448 Personal history of other diseases of urinary system: Secondary | ICD-10-CM | POA: Diagnosis not present

## 2023-12-16 DIAGNOSIS — L509 Urticaria, unspecified: Secondary | ICD-10-CM

## 2023-12-16 DIAGNOSIS — M62838 Other muscle spasm: Secondary | ICD-10-CM | POA: Diagnosis not present

## 2023-12-16 DIAGNOSIS — K581 Irritable bowel syndrome with constipation: Secondary | ICD-10-CM

## 2023-12-16 DIAGNOSIS — Z9889 Other specified postprocedural states: Secondary | ICD-10-CM

## 2023-12-16 DIAGNOSIS — E559 Vitamin D deficiency, unspecified: Secondary | ICD-10-CM

## 2023-12-16 DIAGNOSIS — F411 Generalized anxiety disorder: Secondary | ICD-10-CM

## 2023-12-16 DIAGNOSIS — N3945 Continuous leakage: Secondary | ICD-10-CM | POA: Diagnosis not present

## 2023-12-16 DIAGNOSIS — Z0000 Encounter for general adult medical examination without abnormal findings: Secondary | ICD-10-CM

## 2023-12-16 MED ORDER — CYCLOBENZAPRINE HCL 5 MG PO TABS: 5.0000 mg | ORAL_TABLET | Freq: Three times a day (TID) | ORAL | 1 refills | Status: DC | PRN

## 2023-12-16 MED ORDER — LINACLOTIDE 145 MCG PO CAPS: 145.0000 ug | ORAL_CAPSULE | Freq: Every day | ORAL | 3 refills | Status: DC

## 2023-12-16 MED ORDER — ROSUVASTATIN CALCIUM 20 MG PO TABS: 20.0000 mg | ORAL_TABLET | Freq: Every day | ORAL | 3 refills | Status: DC

## 2023-12-16 MED ORDER — MIRTAZAPINE 30 MG PO TABS: 30.0000 mg | ORAL_TABLET | Freq: Every day | ORAL | 3 refills | Status: AC

## 2023-12-16 MED ORDER — FAMOTIDINE 20 MG PO TABS: 20.0000 mg | ORAL_TABLET | Freq: Two times a day (BID) | ORAL | 3 refills | Status: AC | PRN

## 2023-12-16 MED ORDER — LEVOTHYROXINE SODIUM 75 MCG PO TABS: 75.0000 ug | ORAL_TABLET | Freq: Every day | ORAL | 2 refills | Status: AC

## 2023-12-16 NOTE — Progress Notes (Signed)
 Jasmin Butler is a 73 y.o. female presents to office today for annual physical exam examination.    Concerns today include: 1.  Bladder incontinence She reports several month history of bladder incontinence.  She describes this as continuous urinary leakage, that occurs when she wakes up in the morning.  A course this is exacerbated by sneezing, coughing or straining.  She has a history of some type of bladder tacking surgery when she had gynecologic surgery many years ago.  She does not want to see a urologist at this time.  Denies any dysuria, hematuria, odors or discoloration.  No bulging in the vagina  Occupation: retired, Substance use: none There are no preventive care reminders to display for this patient.  Refills needed today: all  Immunization History  Administered Date(s) Administered   Fluad Quad(high Dose 65+) 08/28/2016, 07/23/2017, 06/14/2019, 07/14/2020, 07/16/2021, 09/13/2022   Influenza, High Dose Seasonal PF 08/28/2016, 07/23/2017, 07/15/2018   Influenza,inj,Quad PF,6+ Mos 08/14/2020   Influenza-Unspecified 08/14/2014, 08/28/2016, 07/23/2017   Moderna Sars-Covid-2 Vaccination 11/18/2019   Pneumococcal Conjugate-13 12/12/2015   Pneumococcal Polysaccharide-23 02/27/2017   Tdap 06/19/2018   Zoster Recombinant(Shingrix) 02/13/2022, 05/16/2022   Past Medical History:  Diagnosis Date   Allergy 1975   Penicillin   Allergy to alpha-gal    Anxiety    Arthritis 1995   Spirs in neck   Cataract    Depression    "couple times/yr" (09/29/2014)   GERD (gastroesophageal reflux disease) 2005   Headache    "weekly" (09/29/2014)   History of hiatal hernia    Hyperlipidemia    IBS (irritable bowel syndrome)    Migraine    "2-3 times/yr" (09/29/2014)   Pernicious anemia    Pneumonia 1980's   "double"   Thyroid disease    Urticaria    Social History   Socioeconomic History   Marital status: Widowed    Spouse name: Not on file   Number of children: 3   Years of  education: GED   Highest education level: GED or equivalent  Occupational History   Occupation: Retired- Education officer, environmental  Tobacco Use   Smoking status: Never   Smokeless tobacco: Never  Vaping Use   Vaping status: Never Used  Substance and Sexual Activity   Alcohol use: No   Drug use: Never   Sexual activity: Not Currently  Other Topics Concern   Not on file  Social History Narrative   2 story home   Her son lives with her   Very independent   Does not believe in annual screenings   Social Drivers of Health   Financial Resource Strain: Low Risk  (11/21/2023)   Overall Financial Resource Strain (CARDIA)    Difficulty of Paying Living Expenses: Not very hard  Food Insecurity: No Food Insecurity (11/21/2023)   Hunger Vital Sign    Worried About Running Out of Food in the Last Year: Never true    Ran Out of Food in the Last Year: Never true  Transportation Needs: No Transportation Needs (11/21/2023)   PRAPARE - Administrator, Civil Service (Medical): No    Lack of Transportation (Non-Medical): No  Physical Activity: Sufficiently Active (11/21/2023)   Exercise Vital Sign    Days of Exercise per Week: 7 days    Minutes of Exercise per Session: 60 min  Stress: No Stress Concern Present (11/21/2023)   [name] of Occupational Health - Occupational Stress Questionnaire    Feeling of Stress : Only a little  Social Connections: Moderately Integrated (11/21/2023)   Social Connection and Isolation Panel [NHANES]    Frequency of Communication with Friends and Family: More than three times a week    Frequency of Social Gatherings with Friends and Family: Twice a week    Attends Religious Services: 1 to 4 times per year    Active Member of [employer] or Organizations: Yes    Attends Banker Meetings: 1 to 4 times per year    Marital Status: Widowed  Intimate Partner Violence: Not At Risk (09/05/2023)   Humiliation, Afraid, Rape, and Kick questionnaire    Fear of  Current or Ex-Partner: No    Emotionally Abused: No    Physically Abused: No    Sexually Abused: No   Past Surgical History:  Procedure Laterality Date   ABDOMINAL HYSTERECTOMY  1985   CARDIAC CATHETERIZATION  ~ 2005   CATARACT EXTRACTION W/ INTRAOCULAR LENS  IMPLANT, BILATERAL Bilateral 2000's   CHOLECYSTECTOMY OPEN  ~ 1990   EYE SURGERY     TUBAL LIGATION  ~ 1983   Family History  Problem Relation Age of Onset   Leukemia Father    Cancer Father        leukemia   Hypertension Mother    Stroke Mother 68   Arthritis Mother    Cancer Mother    Cancer Brother        4 different cancers   Asthma Brother    Hearing loss Sister    Birth defects Son    Early death Son    Arthritis Brother    Cancer Daughter     Current Outpatient Medications:    cyanocobalamin (,VITAMIN B-12,) 1000 MCG/ML injection, Inject 1,000 mcg into the muscle every 30 (thirty) days. , Disp: , Rfl:    EPINEPHrine (EPIPEN 2-PAK) 0.3 mg/0.3 mL IJ SOAJ injection, Inject 0.3 mg into the muscle as needed for anaphylaxis., Disp: 1 each, Rfl: 2   hydrOXYzine (VISTARIL) 25 MG capsule, Take 1 capsule (25 mg total) by mouth every 8 (eight) hours as needed., Disp: 30 capsule, Rfl: 0   cyclobenzaprine (FLEXERIL) 5 MG tablet, Take 1 tablet (5 mg total) by mouth 3 (three) times daily as needed for muscle spasms., Disp: 30 tablet, Rfl: 1   famotidine (PEPCID) 20 MG tablet, Take 1 tablet (20 mg total) by mouth 2 (two) times daily as needed for heartburn or indigestion., Disp: 180 tablet, Rfl: 3   levothyroxine (SYNTHROID) 75 MCG tablet, Take 1 tablet (75 mcg total) by mouth daily., Disp: 90 tablet, Rfl: 2   linaclotide (LINZESS) 145 MCG CAPS capsule, Take 1 capsule (145 mcg total) by mouth daily before breakfast. Please inform that insurance will not cover Trulance, Disp: 90 capsule, Rfl: 3   mirtazapine (REMERON) 30 MG tablet, Take 1 tablet (30 mg total) by mouth at bedtime., Disp: 90 tablet, Rfl: 3   rosuvastatin (CRESTOR)  20 MG tablet, Take 1 tablet (20 mg total) by mouth daily., Disp: 90 tablet, Rfl: 3  Current Facility-Administered Medications:    cyanocobalamin ((VITAMIN B-12)) injection 1,000 mcg, 1,000 mcg, Intramuscular, Q30 days, Sailor Haughn M, DO, 1,000 mcg at 12/15/23 0865  Allergies  Allergen Reactions   Alpha-Gal    Penicillins Rash    Did it involve swelling of the face/tongue/throat, SOB, or low BP? Yes Did it involve sudden or severe rash/hives, skin peeling, or any reaction on the inside of your mouth or nose? Unk Did you need to seek medical attention at a  hospital or doctor's office? Yes When did it last happen? 30 years ago If all above answers are "NO", may proceed with cephalosporin use.      ROS: Review of Systems Ears, nose, mouth, throat, and face: positive for dysphagia.  Occasionally food gets stuck in her throat Genitourinary:positive for urinary incontinence Integument/breast: positive for lots of skin moles    Physical exam BP 125/72   Pulse 63   Temp (!) 97.5 F (36.4 C) (Temporal)   Ht 5\' 3"  (1.6 m)   Wt 148 lb (67.1 kg)   SpO2 96%   BMI 26.22 kg/m  General appearance: alert, cooperative, appears stated age, and no distress Head: Normocephalic, without obvious abnormality, atraumatic Eyes: negative findings: lids and lashes normal, conjunctivae and sclerae normal, corneas clear, and pupils equal, round, reactive to light and accomodation Ears: normal L TM and external ear canals both ears.  Right TM obscured by cerumen Nose: Nares normal. Septum midline. Mucosa normal. No drainage or sinus tenderness. Throat: lips, mucosa, and tongue normal; teeth and gums normal Neck: no adenopathy, supple, symmetrical, trachea midline, and thyroid not enlarged, symmetric, no tenderness/mass/nodules Back:  Slight increased kyphosis of thoracic spine present Lungs: clear to auscultation bilaterally Heart: regular rate and rhythm, S1, S2 normal, no murmur, click, rub or  gallop Abdomen: soft, non-tender; bowel sounds normal; no masses,  no organomegaly Extremities: extremities normal, atraumatic, no cyanosis or edema Pulses: 2+ and symmetric Skin:  Multiple lentigo and nevi noted along the upper extremities bilaterally Lymph nodes: Cervical, supraclavicular, and axillary nodes normal. Neurologic: Grossly normal      09/05/2023   11:37 AM 08/05/2023    7:58 AM 02/24/2023    1:19 PM  Depression screen PHQ 2/9  Decreased Interest 0 0 0  Down, Depressed, Hopeless 0 0 0  PHQ - 2 Score 0 0 0  Altered sleeping 0 0 0  Tired, decreased energy 0 0 0  Change in appetite 0 0 0  Feeling bad or failure about yourself  0 0 0  Trouble concentrating 0 0 0  Moving slowly or fidgety/restless 0 0 0  Suicidal thoughts 0 0 0  PHQ-9 Score 0 0 0  Difficult doing work/chores Not difficult at all Not difficult at all Not difficult at all      08/05/2023    7:58 AM 02/24/2023    1:20 PM 12/13/2022   11:07 AM 09/30/2022   10:47 AM  GAD 7 : Generalized Anxiety Score  Nervous, Anxious, on Edge 0 0 0 0  Control/stop worrying 0 0 0 0  Worry too much - different things 0 0 0 0  Trouble relaxing 0 0 0 0  Restless 0 0 0 0  Easily annoyed or irritable 0 0 0 0  Afraid - awful might happen 0 0 0 0  Total GAD 7 Score 0 0 0 0  Anxiety Difficulty Not difficult at all Not difficult at all  Not difficult at all     Assessment/ Plan: Irena Cords here for annual physical exam.   Annual physical exam  Continuous leakage of urine - Plan: Incontinence supply  History of bladder suspension procedure - Plan: Incontinence supply  Hypothyroidism due to acquired atrophy of thyroid - Plan: TSH + free T4  Mixed hyperlipidemia - Plan: CMP14+EGFR, Lipid Panel, rosuvastatin (CRESTOR) 20 MG tablet  Pernicious anemia - Plan: CBC, Vitamin B12  Vitamin D deficiency - Plan: VITAMIN D 25 Hydroxy (Vit-D Deficiency, Fractures)  Muscle spasms of lower  extremity - Plan: cyclobenzaprine  (FLEXERIL) 5 MG tablet  Urticaria - Plan: famotidine (PEPCID) 20 MG tablet  Irritable bowel syndrome with constipation - Plan: linaclotide (LINZESS) 145 MCG CAPS capsule  Generalized anxiety disorder - Plan: mirtazapine (REMERON) 30 MG tablet  She will come in for fasting labs tomorrow as she was nonfasting this morning.  I offered her referral to urology given history of bladder surgery but she declined.  I have ordered incontinence supplies and we will see if we can get this covered through her insurance she is having continuous leakage of urine.  Check thyroid levels.  Euthymic clinically  Statin renewed for q. OD dosing.  Check B12, CBC given history of pernicious anemia.  Continued on vitamin B injections monthly  Check vitamin D level given history deficiency  Flexeril renewed for as needed use.  Continues to use sparingly  Continue famotidine daily as needed  Linzess working well.  This has been renewed  Mirtazapine being used as needed and this is working well for her.  This has been renewed  Counseled on healthy lifestyle choices, including diet (rich in fruits, vegetables and lean meats and low in salt and simple carbohydrates) and exercise (at least 30 minutes of moderate physical activity daily).  Patient to follow up 1 year for CPE/ thyroid check  Atilano Covelli M. Nadine Counts, DO

## 2023-12-16 NOTE — Patient Instructions (Signed)
 Urinary Incontinence in Females: How to Manage Urinary incontinence, or UI, happens if you can't always control when you pee. If you think you have UI, it's important to talk to your health care provider. How does UI affect me? UI can make it hard to enjoy daily activities. It can affect your social life. It can also affect your mental health. What actions can I take to manage UI? Talk to your provider. There're things you can do and treatments that can help. Change your lifestyle  Quit smoking. Do not smoke, vape, or use nicotine or tobacco. Lose weight or keep a healthy weight. Do pelvic floor muscle exercises as told. You may hear these called Kegel exercises. Stay active. Eat a healthy diet. Change your behavior Try bladder training. This may include using the bathroom at set times during the day. Use the bathroom every 3-4 hours, even if you don't feel the need to pee. Try to empty your bladder of all pee every time you go. After peeing, wait a minute. Then try to pee again. Find ways to reduce bladder urges. This can include distraction techniques or controlled breathing exercises. Make sure you're in a relaxed position while peeing. If needed, wear pads to absorb pee that leaks. Get treatment Treatment for UI depends on the type of incontinence that you have and its cause. This may include: Medicines to relax the bladder muscles. Treatments, such as: Pulses of electricity to help change bladder reflexes (electrical nerve stimulation). A shot of collagen or carbon beads into the bladder opening. This can help thicken tissue and close the bladder opening. Botox injections to relax the bladder muscles. Surgery. Products such as: A pessary. This is a device to prevent pee leaks. It's placed in your vagina to help support your pelvic floor muscles. A catheter. This is a soft tube that's put into your urethra to drain pee from the bladder. The catheter may be connected to a bag that  collects pee. Portable commodes, bedpans, or urinals.  Follow these instructions at home: Eating and drinking Change your diet as told. You may be asked to: Drink fluids in small amounts throughout the day instead of large amounts at one time. Use less caffeine or alcohol. Eat foods that are high in fiber, such as beans, whole grains, or fresh fruits and vegetables. General instructions Take your medicines only as told. Keep all follow-up visits. Your provider may need to change your treatment plan if needed. Where to find more information The Office on Women's Health: [employer]Less[employer].ca [employer] (ACOG): acog.org Contact a health care provide if: Your symptoms don't get better after treatment. Your symptoms get worse. You have new symptoms. This information is not intended to replace advice given to you by your health care provider. Make sure you discuss any questions you have with your health care provider. Document Revised: 09/03/2023 Document Reviewed: 07/03/2023 Elsevier Patient Education  2024 [employer].

## 2023-12-17 ENCOUNTER — Other Ambulatory Visit

## 2023-12-18 ENCOUNTER — Encounter: Payer: Self-pay | Admitting: Family Medicine

## 2023-12-18 ENCOUNTER — Other Ambulatory Visit

## 2023-12-18 DIAGNOSIS — Z91018 Allergy to other foods: Secondary | ICD-10-CM

## 2023-12-18 DIAGNOSIS — E782 Mixed hyperlipidemia: Secondary | ICD-10-CM

## 2023-12-18 DIAGNOSIS — E034 Atrophy of thyroid (acquired): Secondary | ICD-10-CM | POA: Diagnosis not present

## 2023-12-18 DIAGNOSIS — Z87448 Personal history of other diseases of urinary system: Secondary | ICD-10-CM

## 2023-12-18 DIAGNOSIS — E559 Vitamin D deficiency, unspecified: Secondary | ICD-10-CM

## 2023-12-18 DIAGNOSIS — D510 Vitamin B12 deficiency anemia due to intrinsic factor deficiency: Secondary | ICD-10-CM | POA: Diagnosis not present

## 2023-12-18 DIAGNOSIS — N3945 Continuous leakage: Secondary | ICD-10-CM

## 2023-12-19 ENCOUNTER — Other Ambulatory Visit

## 2023-12-19 ENCOUNTER — Encounter: Payer: Self-pay | Admitting: Family Medicine

## 2023-12-19 DIAGNOSIS — Z91018 Allergy to other foods: Secondary | ICD-10-CM | POA: Diagnosis not present

## 2023-12-19 LAB — VITAMIN B12: Vitamin B-12: 946 pg/mL (ref 232–1245)

## 2023-12-19 LAB — CBC
Hematocrit: 39.7 % (ref 34.0–46.6)
Hemoglobin: 12.7 g/dL (ref 11.1–15.9)
MCH: 27.7 pg (ref 26.6–33.0)
MCHC: 32 g/dL (ref 31.5–35.7)
MCV: 87 fL (ref 79–97)
Platelets: 219 10*3/uL (ref 150–450)
RBC: 4.58 x10E6/uL (ref 3.77–5.28)
RDW: 13.8 % (ref 11.7–15.4)
WBC: 6.5 10*3/uL (ref 3.4–10.8)

## 2023-12-19 LAB — LIPID PANEL
Chol/HDL Ratio: 2.5 ratio (ref 0.0–4.4)
Cholesterol, Total: 183 mg/dL (ref 100–199)
HDL: 73 mg/dL (ref >39)
LDL Chol Calc (NIH): 95 mg/dL (ref 0–99)
Triglycerides: 84 mg/dL (ref 0–149)
VLDL Cholesterol Cal: 15 mg/dL (ref 5–40)

## 2023-12-19 LAB — CMP14+EGFR
ALT: 13 IU/L (ref 0–32)
AST: 20 IU/L (ref 0–40)
Albumin: 4.3 g/dL (ref 3.8–4.8)
Alkaline Phosphatase: 86 IU/L (ref 44–121)
BUN/Creatinine Ratio: 12 (ref 12–28)
BUN: 9 mg/dL (ref 8–27)
Bilirubin Total: 0.3 mg/dL (ref 0.0–1.2)
CO2: 26 mmol/L (ref 20–29)
Calcium: 9.2 mg/dL (ref 8.7–10.3)
Chloride: 102 mmol/L (ref 96–106)
Creatinine, Ser: 0.74 mg/dL (ref 0.57–1.00)
Globulin, Total: 2.3 g/dL (ref 1.5–4.5)
Glucose: 89 mg/dL (ref 70–99)
Potassium: 4.6 mmol/L (ref 3.5–5.2)
Sodium: 142 mmol/L (ref 134–144)
Total Protein: 6.6 g/dL (ref 6.0–8.5)
eGFR: 85 mL/min/{1.73_m2} (ref >59)

## 2023-12-19 LAB — VITAMIN D 25 HYDROXY (VIT D DEFICIENCY, FRACTURES): Vit D, 25-Hydroxy: 36.9 ng/mL (ref 30.0–100.0)

## 2023-12-19 LAB — TSH+FREE T4
Free T4: 1.18 ng/dL (ref 0.82–1.77)
TSH: 1.13 u[IU]/mL (ref 0.450–4.500)

## 2023-12-19 NOTE — Addendum Note (Signed)
 Addended by: Raliegh Ip on: 12/19/2023 10:33 AM   Modules accepted: Orders

## 2023-12-24 ENCOUNTER — Encounter: Payer: Self-pay | Admitting: Family Medicine

## 2023-12-24 LAB — ALPHA-GAL PANEL
Allergen Lamb IgE: <0.1 kU/L
Beef IgE: 0.27 kU/L — AB
IgE (Immunoglobulin E), Serum: 19 [IU]/mL (ref 6–495)
O215-IgE Alpha-Gal: 0.67 kU/L — AB
Pork IgE: <0.1 kU/L

## 2024-01-14 DIAGNOSIS — R32 Unspecified urinary incontinence: Secondary | ICD-10-CM | POA: Insufficient documentation

## 2024-01-14 NOTE — Progress Notes (Deleted)
 Name: Jasmin Butler DOB: 02/25/1951 MRN: [id number]  History of Present Illness: Ms. Ungerer is a 73 y.o. female who presents today as a new patient at [facility]. All available relevant medical records have been reviewed.  ***She is accompanied by ***. GU History includes: 1. ***.  She reports chief complaint of urinary incontinence.  Per PCP note on 12/16/2023: - "She reports several month history of bladder incontinence. She describes this as continuous urinary leakage, that occurs when she wakes up in the morning. A course this is exacerbated by sneezing, coughing or straining. She has a history of some type of bladder tacking surgery when she had gynecologic surgery many years ago. She does not want to see a urologist at this time. Denies any dysuria, hematuria, odors or discoloration. No bulging in the vagina". - Ordered incontinence supplies.  She {Actions; denies-reports:120008} urge incontinence. She {Actions; denies-reports:120008} stress incontinence with ***cough/***laugh/***sneeze/***heavy lifting /***exercise. She reports the ***SUI / ***UUI is predominant.  She leaks *** times per ***. Wears *** ***pads/ ***diapers per day. She reports urinary incontinence {ACTION; IS/IS [id number]} significantly bothersome.   She reports urinary ***frequency, ***nocturia, and ***urgency. Voiding ***x/day and ***x/night on average.   She {Actions; denies-reports:120008} prior attempted treatment for these symptoms ***including ***  She {Actions; denies-reports:120008} significant caffeine intake (*** caffeinated beverages per day on average). She {does/does not:200015} take a daily diuretic use.  She {Actions; denies-reports:120008} history of obstructive sleep apnea; {Actions; denies-reports:120008} wearing CPAP routinely.  ***She denies ever having a sleep study before. She {Actions; denies-reports:120008} fluid intake within 3 hours prior to bedtime. She  {Actions; denies-reports:120008} fluid intake during the night.  She {Actions; denies-reports:120008} caffeine intake within 8 hours prior to bedtime. She {Actions; denies-reports:120008} routinely experiencing lower extremity edema during the day. ***She {Actions; denies-reports:120008} elevating feet during the day and/or wearing compression socks when lower extremity edema is present during the day. ***She {Actions; denies-reports:120008} monitoring dietary salt and sodium intake.  She {Actions; denies-reports:120008} dysuria, gross hematuria, straining to void, or sensations of incomplete emptying.   Medications: Current Outpatient Medications  Medication Sig Dispense Refill   cyanocobalamin (,VITAMIN B-12,) 1000 MCG/ML injection Inject 1,000 mcg into the muscle every 30 (thirty) days.      cyclobenzaprine (FLEXERIL) 5 MG tablet Take 1 tablet (5 mg total) by mouth 3 (three) times daily as needed for muscle spasms. 30 tablet 1   EPINEPHrine (EPIPEN 2-PAK) 0.3 mg/0.3 mL IJ SOAJ injection Inject 0.3 mg into the muscle as needed for anaphylaxis. 1 each 2   famotidine (PEPCID) 20 MG tablet Take 1 tablet (20 mg total) by mouth 2 (two) times daily as needed for heartburn or indigestion. 180 tablet 3   hydrOXYzine (VISTARIL) 25 MG capsule Take 1 capsule (25 mg total) by mouth every 8 (eight) hours as needed. 30 capsule 0   levothyroxine (SYNTHROID) 75 MCG tablet Take 1 tablet (75 mcg total) by mouth daily. 90 tablet 2   linaclotide (LINZESS) 145 MCG CAPS capsule Take 1 capsule (145 mcg total) by mouth daily before breakfast. Please inform that insurance will not cover Trulance 90 capsule 3   mirtazapine (REMERON) 30 MG tablet Take 1 tablet (30 mg total) by mouth at bedtime. 90 tablet 3   rosuvastatin (CRESTOR) 20 MG tablet Take 1 tablet (20 mg total) by mouth daily. 90 tablet 3   Current Facility-Administered Medications  Medication Dose Route Frequency Provider Last Rate Last Admin    cyanocobalamin ((VITAMIN B-12)) injection 1,000 mcg  1,000 mcg Intramuscular Q30 days Delynn Flavin M, DO   1,000 mcg at 12/15/23 1191    Allergies: Allergies  Allergen Reactions   Alpha-Gal    Penicillins Rash    Did it involve swelling of the face/tongue/throat, SOB, or low BP? Yes Did it involve sudden or severe rash/hives, skin peeling, or any reaction on the inside of your mouth or nose? Unk Did you need to seek medical attention at a hospital or doctor's office? Yes When did it last happen? 30 years ago If all above answers are "NO", may proceed with cephalosporin use.     Past Medical History:  Diagnosis Date   Allergy 1975   Penicillin   Allergy to alpha-gal    Anxiety    Arthritis 1995   Spirs in neck   Cataract    Depression    "couple times/yr" (09/29/2014)   GERD (gastroesophageal reflux disease) 2005   Headache    "weekly" (09/29/2014)   History of hiatal hernia    Hyperlipidemia    IBS (irritable bowel syndrome)    Migraine    "2-3 times/yr" (09/29/2014)   Pernicious anemia    Pneumonia 1980's   "double"   Thyroid disease    Urticaria    Past Surgical History:  Procedure Laterality Date   ABDOMINAL HYSTERECTOMY  1985   CARDIAC CATHETERIZATION  ~ 2005   CATARACT EXTRACTION W/ INTRAOCULAR LENS  IMPLANT, BILATERAL Bilateral 2000's   CHOLECYSTECTOMY OPEN  ~ 1990   EYE SURGERY     TUBAL LIGATION  ~ 1983   Family History  Problem Relation Age of Onset   Leukemia Father    Cancer Father        leukemia   Hypertension Mother    Stroke Mother 53   Arthritis Mother    Cancer Mother    Cancer Brother        4 different cancers   Asthma Brother    Hearing loss Sister    Birth defects Son    Early death Son    Arthritis Brother    Cancer Daughter    Social History   Socioeconomic History   Marital status: Widowed    Spouse name: Not on file   Number of children: 3   Years of education: GED   Highest education level: GED or equivalent   Occupational History   Occupation: Retired- Education officer, environmental  Tobacco Use   Smoking status: Never   Smokeless tobacco: Never  Vaping Use   Vaping status: Never Used  Substance and Sexual Activity   Alcohol use: No   Drug use: Never   Sexual activity: Not Currently  Other Topics Concern   Not on file  Social History Narrative   2 story home   Her son lives with her   Very independent   Does not believe in annual screenings   Social Drivers of Health   Financial Resource Strain: Low Risk  (11/21/2023)   Overall Financial Resource Strain (CARDIA)    Difficulty of Paying Living Expenses: Not very hard  Food Insecurity: No Food Insecurity (11/21/2023)   Hunger Vital Sign    Worried About Running Out of Food in the Last Year: Never true    Ran Out of Food in the Last Year: Never true  Transportation Needs: No Transportation Needs (11/21/2023)   PRAPARE - Administrator, Civil Service (Medical): No    Lack of Transportation (Non-Medical): No  Physical Activity: Sufficiently Active (11/21/2023)  Exercise Vital Sign    Days of Exercise per Week: 7 days    Minutes of Exercise per Session: 60 min  Stress: No Stress Concern Present (11/21/2023)   [name] of Occupational Health - Occupational Stress Questionnaire    Feeling of Stress : Only a little  Social Connections: Moderately Integrated (11/21/2023)   Social Connection and Isolation Panel [NHANES]    Frequency of Communication with Friends and Family: More than three times a week    Frequency of Social Gatherings with Friends and Family: Twice a week    Attends Religious Services: 1 to 4 times per year    Active Member of [employer] or Organizations: Yes    Attends Banker Meetings: 1 to 4 times per year    Marital Status: Widowed  Intimate Partner Violence: Not At Risk (09/05/2023)   Humiliation, Afraid, Rape, and Kick questionnaire    Fear of Current or Ex-Partner: No    Emotionally Abused: No     Physically Abused: No    Sexually Abused: No    SUBJECTIVE  Review of Systems Constitutional: Patient denies any unintentional weight loss or change in strength lntegumentary: Patient denies any rashes or pruritus Cardiovascular: Patient denies chest pain or syncope Respiratory: Patient denies shortness of breath***eyes Gastrointestinal: ***Patient {Actions; denies-reports:120008} ***nausea, ***vomiting, ***constipation, ***diarrhea ***As per HPI Musculoskeletal: Patient denies muscle cramps or weakness Neurologic: Patient denies convulsions or seizures Allergic/Immunologic: Patient denies recent allergic reaction(s) Hematologic/Lymphatic: Patient denies bleeding tendencies Endocrine: Patient denies heat/cold intolerance  GU: As per HPI.  OBJECTIVE There were no vitals filed for this visit. There is no height or weight on file to calculate BMI.  Physical Examination Constitutional: No obvious distress; patient is non-toxic appearing  Eyes: Patient {Actions; denies-reports:120008} dry eyes ENT: Patient {Actions; denies-reports:120008} dry mouth Cardiovascular: Patient denies chest pain or syncope Respiratory: Patient denies shortness of breath Gastrointestinal: ***Patient {Actions; denies-reports:120008} ***constipation ***diarrhea ***As per HPI Musculoskeletal: Normal ROM of UEs  Skin: No obvious rashes/open sores  Neurologic: CN 2-12 grossly intact Psychiatric: Answered questions appropriately with normal affect  Hematologic/Lymphatic/Immunologic: No obvious bruises or sites of spontaneous bleeding  UA: ***negative ***positive for *** leukocytes, *** blood, ***nitrites Urine microscopy: *** WBC/hpf, *** RBC/hpf, *** bacteria ***otherwise unremarkable ***glucosuria (secondary to ***Jardiance ***Farxiga use)  PVR: *** ml  ASSESSMENT No diagnosis found.  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  1. For treatment of stress urinary incontinence: The etiology of this condition was explained in detail to include pelvic floor muscle relaxation and detachment of the urethra away from its connection to the pubic bone. Her risk profile was reviewed, including childbirth.  ***A visual demonstration of the pathophysiology of stress urinary incontinence was reviewed.  The management options were reviewed to include: No intervention, observation. Non-surgical options: Pelvic floor muscle rehabilitation Weight loss Incontinence pessary Surgical consultation  ***Options may include a midurethral sling (with synthetic mesh implant or autologous fascial sling), a Burch urethropexy, or transurethral injection of a bulking agent. Detailed discussion was had regarding the potential risks of both procedures, including: mesh exposure, mesh erosion, failure to resolve SUI, ineffectiveness of these procedures for urge UI, dyspareunia, voiding dysfunction and/or retention.  She elected to proceed with ***.  *** Consider possibility of vaginal voiding if pt has recessed urethra and only leaks around 1 tsp at a time. Some urine can collect in vagina and leak upon  standing after voiding. If this is suspected, advise them to lean forward while voiding and wipe inside vagina after voiding for 3 months. If symptoms persist, then move on to UDT for further evaluation.  2. For treatment of OAB with urinary frequency, nocturia, urgency, and urge incontinence: We discussed the symptoms of overactive bladder (OAB), which include urinary urgency, frequency, nocturia, with or without urge incontinence.  While we may not know the exact etiology of OAB, several risk factors can be identified.  - Patient's neurogenic risk factors: ***T2DM ***with neuropathy, ***nicotine use, ***spinal stenosis, ***prior stroke, ***dementia.  - Patient's exacerbating factors include:  ***diuretic use, ***caffeine intake, ***glucosuria (due to ***Jardiance / ***Farxiga use), ***ambulatory dysfunction (functional incontinence).   We discussed the following management options in detail including potential benefits, risks, and side effects: Behavioral therapy: Modify fluid intake Decreasing bladder irritants (such as caffeine) Bladder retraining / timed voiding Double voiding ***Consider discussing possible alternatives to ***Jardiance Marcelline Deist with prescribing provider. This medication causes excess sugar to be excreted into the urine. That can prompt the kidneys to put out more water to dilute that sugar in the urine and it can also irritate the bladder lining, both of which may contribute to OAB symptoms (urinary frequency, urgency, and urge incontinence). Medication(s): - Anticholinergic medications: ***We discussed potential side effects of anticholinergic medications such as urinary retention, dry eyes, dry mouth, constipation, confusion, cognitive impairment / dementia.  ***Not a safe candidate for anticholinergic medications due to risk for side effects based on patient's age, comorbidities, and pre-existing ***dry mouth ***dry eyes ***constipation ***Parkinsons disease ***MS.  - Beta-3 agonist medications: We discussed potential side effects of beta-3 agonist medications such as urinary retention and (infrequently) elevated blood pressure.  - Combination therapy with anticholinergic medication + beta-3 agonist medication. 3. For refractory cases: PTNS (posterior tibial nerve stimulation) ***Not a safe candidate for PTNS due to ***bleeding disorder, ***anticoagulant use, ***pregnancy, ***pacemaker, ***implanted cardiac defibrillator (ICD), ***neuropathy / nerve damage / nerve conduction disorder, ***lower extremity metal implant(s).  Sacral neuromodulation trial (Medtronic lnterStim or Axonics implant) Bladder Botox injections  ***Consider discussing possible  alternatives to ***Jardiance ***Farxiga with prescribing provider. This medication causes excess sugar to be excreted into the urine. That can prompt the kidneys to put out more water to dilute that sugar in the urine and it can also irritate the bladder lining, both of which may contribute to OAB symptoms (urinary frequency, urgency, and urge incontinence).  She decided to proceed with *** ***work on behavioral modifications including ***minimizing caffeine intake and working on ***timed voiding / bladder retraining.  We agreed to plan for follow up in *** weeks / *** months or sooner if needed. Patient verbalized understanding of and agreement with current plan. All questions were answered.  PLAN Advised the following: *** ***. Minimize caffeine intake. ***. Work on timed voiding. ***. Double/triple voiding. ***. No follow-ups on file.  No orders of the defined types were placed in this encounter.   It has been explained that the patient is to follow regularly with their PCP in addition to all other providers involved in their care and to follow instructions provided by these respective offices. Patient advised to contact urology clinic if any urologic-pertaining questions, concerns, new symptoms or problems arise in the interim period.  There are no Patient Instructions on file for this visit.  Electronically signed by:  Donnita Falls, MSN, FNP-C, CUNP 01/14/2024 2:30 PM

## 2024-01-15 ENCOUNTER — Ambulatory Visit: Admitting: *Deleted

## 2024-01-15 DIAGNOSIS — E538 Deficiency of other specified B group vitamins: Secondary | ICD-10-CM

## 2024-01-15 NOTE — Progress Notes (Signed)
 Patient is in office today for a nurse visit for B12 Injection. Patient Injection was given in the  Left deltoid. Patient tolerated injection well.

## 2024-01-16 ENCOUNTER — Ambulatory Visit: Admitting: Urology

## 2024-01-16 DIAGNOSIS — R32 Unspecified urinary incontinence: Secondary | ICD-10-CM

## 2024-01-24 DIAGNOSIS — H524 Presbyopia: Secondary | ICD-10-CM | POA: Diagnosis not present

## 2024-01-24 DIAGNOSIS — H5213 Myopia, bilateral: Secondary | ICD-10-CM | POA: Diagnosis not present

## 2024-02-13 ENCOUNTER — Ambulatory Visit (INDEPENDENT_AMBULATORY_CARE_PROVIDER_SITE_OTHER)

## 2024-02-13 DIAGNOSIS — E538 Deficiency of other specified B group vitamins: Secondary | ICD-10-CM | POA: Diagnosis not present

## 2024-02-13 NOTE — Progress Notes (Signed)
 Patient is in office today for a nurse visit for B12 Injection. Patient Injection was given in the  Right deltoid. Patient tolerated injection well.

## 2024-03-15 ENCOUNTER — Ambulatory Visit (INDEPENDENT_AMBULATORY_CARE_PROVIDER_SITE_OTHER): Admitting: *Deleted

## 2024-03-15 DIAGNOSIS — E538 Deficiency of other specified B group vitamins: Secondary | ICD-10-CM

## 2024-03-15 NOTE — Progress Notes (Addendum)
 Patient is in office today for a nurse visit for B12 Injection. Patient Injection was given in the  Left deltoid. Patient tolerated injection well.

## 2024-04-11 DIAGNOSIS — K529 Noninfective gastroenteritis and colitis, unspecified: Secondary | ICD-10-CM | POA: Diagnosis not present

## 2024-04-11 DIAGNOSIS — N3000 Acute cystitis without hematuria: Secondary | ICD-10-CM | POA: Diagnosis not present

## 2024-04-11 DIAGNOSIS — N200 Calculus of kidney: Secondary | ICD-10-CM | POA: Diagnosis not present

## 2024-04-13 ENCOUNTER — Encounter: Payer: Self-pay | Admitting: Family Medicine

## 2024-04-13 ENCOUNTER — Ambulatory Visit (INDEPENDENT_AMBULATORY_CARE_PROVIDER_SITE_OTHER): Admitting: Family Medicine

## 2024-04-13 ENCOUNTER — Ambulatory Visit: Payer: Self-pay | Admitting: Urology

## 2024-04-13 VITALS — BP 145/78 | HR 71 | Temp 96.4°F | Ht 63.0 in | Wt 152.8 lb

## 2024-04-13 DIAGNOSIS — R7309 Other abnormal glucose: Secondary | ICD-10-CM

## 2024-04-13 DIAGNOSIS — R6889 Other general symptoms and signs: Secondary | ICD-10-CM | POA: Diagnosis not present

## 2024-04-13 DIAGNOSIS — K529 Noninfective gastroenteritis and colitis, unspecified: Secondary | ICD-10-CM

## 2024-04-13 DIAGNOSIS — R350 Frequency of micturition: Secondary | ICD-10-CM

## 2024-04-13 LAB — URINALYSIS, ROUTINE W REFLEX MICROSCOPIC
Bilirubin, UA: NEGATIVE
Glucose, UA: NEGATIVE
Ketones, UA: NEGATIVE
Nitrite, UA: NEGATIVE
Protein,UA: NEGATIVE
RBC, UA: NEGATIVE
Specific Gravity, UA: 1.015 (ref 1.005–1.030)
Urobilinogen, Ur: 1 mg/dL (ref 0.2–1.0)
pH, UA: 7 (ref 5.0–7.5)

## 2024-04-13 LAB — MICROSCOPIC EXAMINATION
Bacteria, UA: NONE SEEN
Epithelial Cells (non renal): NONE SEEN /HPF (ref 0–10)
RBC, Urine: NONE SEEN /HPF (ref 0–2)
Renal Epithel, UA: NONE SEEN /HPF
WBC, UA: NONE SEEN /HPF (ref 0–5)

## 2024-04-13 LAB — BAYER DCA HB A1C WAIVED: HB A1C (BAYER DCA - WAIVED): 6 % — ABNORMAL HIGH (ref 4.8–5.6)

## 2024-04-13 NOTE — Progress Notes (Signed)
 Subjective:  Patient ID: Jasmin Butler, female    DOB: [birth year], 73 y.o.   MRN: [id number]  Patient Care Team: Jolinda Norene HERO, DO as PCP - General (Family Medicine) Lavona Agent, MD as PCP - Cardiology (Cardiology) [facility]   Chief Complaint:  ER follow up  (04/11/2024/[facility] Emergency Department- colitis. States she is still having abd pain. )   HPI: Jasmin Butler is a 73 y.o. female presenting on 04/13/2024 for ER follow up  (04/11/2024/[facility] Emergency Department- colitis. States she is still having abd pain. )   Jasmin Butler is a 73 year old female with colitis who presents with abdominal pain and constipation.  Abdominal pain and gastrointestinal symptoms - Ongoing abdominal pain since Friday morning, initially associated with lower abdominal spasms and difficulty with bowel movements - Spasms resolved, but abdominal pain persists - Difficulty eating and feeling unwell after eating out on Saturday - No fever, blood in stool, or urinary symptoms - Mucus present in stool, no blood - History of irritable bowel syndrome (IBS) - Suspects increased blueberry consumption may have exacerbated symptoms  Constipation and bowel habits - Constipation since Friday, with no bowel movement since then - Used two or three suppositories to achieve a small bowel movement, which provided some relief - Taking Linzess , though not regularly; took one dose yesterday without effect  Antibiotic and medication use - Diagnosed with colitis at [facility] - Prescribed ciprofloxacin and metronidazole (Flagyl); taking as prescribed - Developed nausea from antibiotics, for which an additional medication was prescribed - Experienced severe headaches after mistakenly swallowing the nausea medication instead of letting it dissolve under the tongue - Did not take the nausea medication this morning but continues antibiotics  Imaging  and laboratory findings from ED visit - CT scan showed thickening of the sigmoid colon - Laboratory results: normal white blood cell count, slightly low MCHC, elevated glucose (possibly stress-related)  Renal findings - History of a small kidney stone, currently asymptomatic. Urinalysis with leukocytosis. She does have frequency.   Colorectal cancer screening and gastroenterology follow-up - No gastrointestinal specialist evaluation or colonoscopy in over thirty years - Uses Cologuard annually for colorectal cancer screening       Relevant past medical, surgical, family, and social history reviewed and updated as indicated.  Allergies and medications reviewed and updated. Data reviewed: Chart in Epic.   Past Medical History:  Diagnosis Date   Allergy 1975   Penicillin   Allergy to alpha-gal    Anxiety    Arthritis 1995   Spirs in neck   Cataract    Depression    couple times/yr (09/29/2014)   GERD (gastroesophageal reflux disease) 2005   Headache    weekly (09/29/2014)   History of hiatal hernia    Hyperlipidemia    IBS (irritable bowel syndrome)    Migraine    2-3 times/yr (09/29/2014)   Pernicious anemia    Pneumonia 1980's   double   Thyroid  disease    Urticaria     Past Surgical History:  Procedure Laterality Date   ABDOMINAL HYSTERECTOMY  1985   CARDIAC CATHETERIZATION  ~ 2005   CATARACT EXTRACTION W/ INTRAOCULAR LENS  IMPLANT, BILATERAL Bilateral 2000's   CHOLECYSTECTOMY OPEN  ~ 1990   EYE SURGERY     TUBAL LIGATION  ~ 1983    Social History   Socioeconomic History   Marital status: Widowed    Spouse name: Not  on file   Number of children: 3   Years of education: GED   Highest education level: GED or equivalent  Occupational History   Occupation: Retired- Education officer, environmental  Tobacco Use   Smoking status: Never   Smokeless tobacco: Never  Vaping Use   Vaping status: Never Used  Substance and Sexual Activity   Alcohol use: No   Drug use:  Never   Sexual activity: Not Currently  Other Topics Concern   Not on file  Social History Narrative   2 story home   Her son lives with her   Very independent   Does not believe in annual screenings   Social Drivers of Health   Financial Resource Strain: Low Risk  (11/21/2023)   Overall Financial Resource Strain (CARDIA)    Difficulty of Paying Living Expenses: Not very hard  Food Insecurity: No Food Insecurity (11/21/2023)   Hunger Vital Sign    Worried About Running Out of Food in the Last Year: Never true    Ran Out of Food in the Last Year: Never true  Transportation Needs: No Transportation Needs (11/21/2023)   PRAPARE - Administrator, Civil Service (Medical): No    Lack of Transportation (Non-Medical): No  Physical Activity: Sufficiently Active (11/21/2023)   Exercise Vital Sign    Days of Exercise per Week: 7 days    Minutes of Exercise per Session: 60 min  Stress: No Stress Concern Present (11/21/2023)   [name] of Occupational Health - Occupational Stress Questionnaire    Feeling of Stress : Only a little  Social Connections: Moderately Integrated (11/21/2023)   Social Connection and Isolation Panel    Frequency of Communication with Friends and Family: More than three times a week    Frequency of Social Gatherings with Friends and Family: Twice a week    Attends Religious Services: 1 to 4 times per year    Active Member of [employer] or Organizations: Yes    Attends Banker Meetings: 1 to 4 times per year    Marital Status: Widowed  Intimate Partner Violence: Not At Risk (04/11/2024)   [facility]   HITS    Over the last 12 months how often did your partner physically hurt you?: Never    Over the last 12 months how often did your partner insult you or talk down to you?: Never    Over the last 12 months how often did your partner threaten you with physical harm?: Never    Over the last 12 months how often did your partner scream  or curse at you?: Never    Outpatient Encounter Medications as of 04/13/2024  Medication Sig   ciprofloxacin (CIPRO) 500 MG tablet Take 500 mg by mouth.   cyanocobalamin  (,VITAMIN B-12,) 1000 MCG/ML injection Inject 1,000 mcg into the muscle every 30 (thirty) days.    cyclobenzaprine  (FLEXERIL ) 5 MG tablet Take 1 tablet (5 mg total) by mouth 3 (three) times daily as needed for muscle spasms.   EPINEPHrine  (EPIPEN  2-PAK) 0.3 mg/0.3 mL IJ SOAJ injection Inject 0.3 mg into the muscle as needed for anaphylaxis.   famotidine  (PEPCID ) 20 MG tablet Take 1 tablet (20 mg total) by mouth 2 (two) times daily as needed for heartburn or indigestion.   hydrOXYzine  (VISTARIL ) 25 MG capsule Take 1 capsule (25 mg total) by mouth every 8 (eight) hours as needed.   levothyroxine  (SYNTHROID ) 75 MCG tablet Take 1 tablet (75 mcg total) by mouth  daily.   linaclotide  (LINZESS ) 145 MCG CAPS capsule Take 1 capsule (145 mcg total) by mouth daily before breakfast. Please inform that insurance will not cover Trulance    metroNIDAZOLE (FLAGYL) 500 MG tablet Take 500 mg by mouth.   mirtazapine  (REMERON ) 30 MG tablet Take 1 tablet (30 mg total) by mouth at bedtime.   rosuvastatin  (CRESTOR ) 20 MG tablet Take 1 tablet (20 mg total) by mouth daily. (Patient not taking: Reported on 04/13/2024)   Facility-Administered Encounter Medications as of 04/13/2024  Medication   cyanocobalamin  ((VITAMIN B-12)) injection 1,000 mcg    Allergies  Allergen Reactions   Alpha-Gal    Penicillins Rash    Did it involve swelling of the face/tongue/throat, SOB, or low BP? Yes Did it involve sudden or severe rash/hives, skin peeling, or any reaction on the inside of your mouth or nose? Unk Did you need to seek medical attention at a hospital or doctor's office? Yes When did it last happen? 30 years ago If all above answers are "NO", may proceed with cephalosporin use.     Pertinent ROS per HPI, otherwise unremarkable      Objective:  BP (!)  145/78   Pulse 71   Temp (!) 96.4 F (35.8 C)   Ht 5' 3 (1.6 m)   Wt 152 lb 12.8 oz (69.3 kg)   SpO2 95%   BMI 27.07 kg/m    Wt Readings from Last 3 Encounters:  04/13/24 152 lb 12.8 oz (69.3 kg)  12/16/23 148 lb (67.1 kg)  11/21/23 148 lb (67.1 kg)    Physical Exam Vitals and nursing note reviewed.  Constitutional:      General: She is not in acute distress.    Appearance: Normal appearance. She is well-developed and well-groomed. She is not ill-appearing, toxic-appearing or diaphoretic.  HENT:     Head: Normocephalic and atraumatic.     Jaw: There is normal jaw occlusion.     Right Ear: Hearing normal.     Left Ear: Hearing normal.     Nose: Nose normal.     Mouth/Throat:     Lips: Pink.     Mouth: Mucous membranes are moist.     Pharynx: Oropharynx is clear. Uvula midline.   Eyes:     General: Lids are normal.     Extraocular Movements: Extraocular movements intact.     Conjunctiva/sclera: Conjunctivae normal.     Pupils: Pupils are equal, round, and reactive to light.   Neck:     Thyroid : No thyroid  mass, thyromegaly or thyroid  tenderness.     Vascular: No carotid bruit or JVD.     Trachea: Trachea and phonation normal.   Cardiovascular:     Rate and Rhythm: Normal rate and regular rhythm.     Chest Wall: PMI is not displaced.     Pulses: Normal pulses.     Heart sounds: Normal heart sounds. No murmur heard.    No friction rub. No gallop.  Pulmonary:     Effort: Pulmonary effort is normal. No respiratory distress.     Breath sounds: Normal breath sounds. No wheezing.  Abdominal:     General: Bowel sounds are normal. There is no distension or abdominal bruit.     Palpations: Abdomen is soft. There is no hepatomegaly or splenomegaly.     Tenderness: There is no abdominal tenderness. There is no right CVA tenderness or left CVA tenderness.     Hernia: No hernia is present.   Musculoskeletal:  General: Normal range of motion.     Cervical back:  Normal range of motion and neck supple.     Right lower leg: No edema.     Left lower leg: No edema.  Lymphadenopathy:     Cervical: No cervical adenopathy.   Skin:    General: Skin is warm and dry.     Capillary Refill: Capillary refill takes less than 2 seconds.     Coloration: Skin is not cyanotic, jaundiced or pale.     Findings: No rash.   Neurological:     General: No focal deficit present.     Mental Status: She is alert and oriented to person, place, and time.     Sensory: Sensation is intact.     Motor: Motor function is intact.     Coordination: Coordination is intact.     Gait: Gait is intact.     Deep Tendon Reflexes: Reflexes are normal and symmetric.   Psychiatric:        Attention and Perception: Attention and perception normal.        Mood and Affect: Mood and affect normal.        Speech: Speech normal.        Behavior: Behavior normal. Behavior is cooperative.        Thought Content: Thought content normal.        Cognition and Memory: Cognition and memory normal.        Judgment: Judgment normal.     Results for orders placed or performed in visit on 12/19/23  Alpha-Gal Panel   Collection Time: 12/19/23 11:00 AM  Result Value Ref Range   Class Description Allergens Comment    IgE (Immunoglobulin E), Serum 19 6 - 495 IU/mL   Pork IgE <0.10 Class 0 kU/L   Beef IgE 0.27 (A) Class 0/I kU/L   Allergen Lamb IgE <0.10 Class 0 kU/L   O215-IgE Alpha-Gal 0.67 (A) Class II kU/L       Pertinent labs & imaging results that were available during my care of the patient were reviewed by me and considered in my medical decision making.  Assessment & Plan:  Tully was seen today for er follow up .  Diagnoses and all orders for this visit:  Colitis -     Anemia Profile B -     CMP14+EGFR  Elevated glucose -     Bayer DCA Hb A1c Waived  Urinary frequency -     Urine Culture -     Urinalysis, Routine w reflex microscopic       Colitis Colitis diagnosed in  the emergency department with CT scan showing thickening of the sigmoid colon, indicating inflammation or infection. Currently on oral antibiotics (Cipro and Flagyl) with some improvement in symptoms but persistent abdominal pain. No fever or blood in stool reported. Labs showed normal white blood cell count, slightly low MCHC, and elevated glucose, likely due to stress from illness. - Continue Cipro and Flagyl for 8 more days - Adopt BRAT diet (bananas, rice, applesauce, toast) to minimize gastrointestinal discomfort - Order repeat labs to monitor white blood cell count and electrolytes - If symptoms worsen or persist after completing more than half of the antibiotics, follow up here or return to the emergency room - Add A1c test to evaluate for potential diabetes due to elevated glucose levels during hospitalization  Irritable Bowel Syndrome (IBS) IBS potentially exacerbated by recent dietary changes (increased blueberry consumption). Current bowel habits not back to  normal, with no bowel movement since Friday. - Take Linzess  145 mcg daily on an empty stomach with a full glass of water - Double the dose of Linzess  to 290 mcg tomorrow morning if no bowel movement since Friday - Contact provider if no bowel movement by 2-3 PM after double dose  General Health Maintenance No colonoscopy in over 30 years, but regular Cologuard tests are performed annually. - Consider referral to a gastroenterologist for further evaluation and management - Discuss the need for a colonoscopy given the long interval since the last one          Continue all other maintenance medications.  Follow up plan: Return if symptoms worsen or fail to improve.   Continue healthy lifestyle choices, including diet (rich in fruits, vegetables, and lean proteins, and low in salt and simple carbohydrates) and exercise (at least 30 minutes of moderate physical activity daily).  Educational handout given for colitis   The  above assessment and management plan was discussed with the patient. The patient verbalized understanding of and has agreed to the management plan. Patient is aware to call the clinic if they develop any new symptoms or if symptoms persist or worsen. Patient is aware when to return to the clinic for a follow-up visit. Patient educated on when it is appropriate to go to the emergency department.   Rosaline Bruns, FNP-C [facility] [phone]

## 2024-04-14 ENCOUNTER — Encounter: Payer: Self-pay | Admitting: Family Medicine

## 2024-04-14 ENCOUNTER — Ambulatory Visit: Payer: Self-pay | Admitting: Family Medicine

## 2024-04-14 ENCOUNTER — Ambulatory Visit: Admitting: *Deleted

## 2024-04-14 ENCOUNTER — Ambulatory Visit: Admitting: Urology

## 2024-04-14 DIAGNOSIS — E538 Deficiency of other specified B group vitamins: Secondary | ICD-10-CM | POA: Diagnosis not present

## 2024-04-14 LAB — ANEMIA PROFILE B
Basophils Absolute: 0.1 x10E3/uL (ref 0.0–0.2)
Basos: 1 %
EOS (ABSOLUTE): 0.1 10*3/uL (ref 0.0–0.4)
Eos: 2 %
Ferritin: 47 ng/mL (ref 15–150)
Folate: 5.4 ng/mL (ref >3.0)
Hematocrit: 36.9 % (ref 34.0–46.6)
Hemoglobin: 11.6 g/dL (ref 11.1–15.9)
Immature Grans (Abs): 0 x10E3/uL (ref 0.0–0.1)
Immature Granulocytes: 0 %
Iron Saturation: 14 % — ABNORMAL LOW (ref 15–55)
Iron: 45 ug/dL (ref 27–139)
Lymphocytes Absolute: 2.3 10*3/uL (ref 0.7–3.1)
Lymphs: 34 %
MCH: 27.7 pg (ref 26.6–33.0)
MCHC: 31.4 g/dL — ABNORMAL LOW (ref 31.5–35.7)
MCV: 88 fL (ref 79–97)
Monocytes Absolute: 0.5 10*3/uL (ref 0.1–0.9)
Monocytes: 7 %
Neutrophils Absolute: 3.9 10*3/uL (ref 1.4–7.0)
Neutrophils: 56 %
Platelets: 245 10*3/uL (ref 150–450)
RBC: 4.19 x10E6/uL (ref 3.77–5.28)
RDW: 12.4 % (ref 11.7–15.4)
Retic Ct Pct: 1.1 % (ref 0.6–2.6)
Total Iron Binding Capacity: 312 ug/dL (ref 250–450)
UIBC: 267 ug/dL (ref 118–369)
Vitamin B-12: 548 pg/mL (ref 232–1245)
WBC: 6.9 10*3/uL (ref 3.4–10.8)

## 2024-04-14 LAB — CMP14+EGFR
ALT: 14 IU/L (ref 0–32)
AST: 23 IU/L (ref 0–40)
Albumin: 4.2 g/dL (ref 3.8–4.8)
Alkaline Phosphatase: 70 IU/L (ref 44–121)
BUN/Creatinine Ratio: 9 — AB (ref 12–28)
BUN: 8 mg/dL (ref 8–27)
Bilirubin Total: <0.2 mg/dL (ref 0.0–1.2)
CO2: 26 mmol/L (ref 20–29)
Calcium: 9.6 mg/dL (ref 8.7–10.3)
Chloride: 101 mmol/L (ref 96–106)
Creatinine, Ser: 0.9 mg/dL (ref 0.57–1.00)
Globulin, Total: 2.1 g/dL (ref 1.5–4.5)
Glucose: 110 mg/dL — AB (ref 70–99)
Potassium: 4.3 mmol/L (ref 3.5–5.2)
Sodium: 142 mmol/L (ref 134–144)
Total Protein: 6.3 g/dL (ref 6.0–8.5)
eGFR: 67 mL/min/{1.73_m2} (ref >59)

## 2024-04-14 NOTE — Patient Instructions (Signed)
B12 injection given right deltoid intramuscular. Patient tolerated well.

## 2024-04-15 LAB — URINE CULTURE: Organism ID, Bacteria: NO GROWTH

## 2024-04-21 ENCOUNTER — Encounter: Payer: Self-pay | Admitting: Family Medicine

## 2024-05-20 ENCOUNTER — Ambulatory Visit: Payer: Self-pay

## 2024-05-20 ENCOUNTER — Ambulatory Visit (INDEPENDENT_AMBULATORY_CARE_PROVIDER_SITE_OTHER)

## 2024-05-20 DIAGNOSIS — E538 Deficiency of other specified B group vitamins: Secondary | ICD-10-CM | POA: Diagnosis not present

## 2024-05-20 NOTE — Telephone Encounter (Signed)
 Appt 8/8. Routing to Dr. KANDICE to see if she would like the appt virtual or in office.

## 2024-05-20 NOTE — Progress Notes (Signed)
 Patient is in office today for a nurse visit for B12 Injection. Patient Injection was given in the  Left deltoid. Patient tolerated injection well.

## 2024-05-20 NOTE — Telephone Encounter (Signed)
 FYI Only or Action Required?: FYI only for provider.  Patient was last seen in primary care on 04/13/2024 by Severa Rock HERO, FNP.  Called Nurse Triage reporting Covid Positive.  Symptoms began today.  Interventions attempted: Nothing.  Symptoms are: gradually worsening.  Triage Disposition: Call PCP Within 24 Hours  Patient/caregiver understands and will follow disposition?: Yes                       Copied from CRM #[id number]. Topic: Clinical - Red Word Triage >> May 20, 2024  3:43 PM Elle L wrote: Red Word that prompted transfer to Nurse Triage: The patient had a B12 shot today and now she is having chills and body aches and pain. She took an at home COVID test and it is showing as positive but she is unsure if it is accurate. Reason for Disposition  [1] HIGH RISK patient (e.g., weak immune system, age > 64 years, obesity with BMI 30 or higher, pregnant, chronic lung disease or other chronic medical condition) AND [2] COVID symptoms (e.g., cough, fever)  (Exceptions: Already seen by PCP and no new or worsening symptoms.)  Answer Assessment - Initial Assessment Questions 1. COVID-19 DIAGNOSIS: How do you know that you have COVID? (e.g., positive lab test or self-test, diagnosed by doctor or NP/PA, symptoms after exposure).     Home test 2. COVID-19 EXPOSURE: Was there any known exposure to COVID before the symptoms began? CDC Definition of close contact: within 6 feet (2 meters) for a total of 15 minutes or more over a 24-hour period.      unknown 3. ONSET: When did the COVID-19 symptoms start?      today 4. WORST SYMPTOM: What is your worst symptom? (e.g., cough, fever, shortness of breath, muscle aches)     Chills - BA and pain 5. COUGH: Do you have a cough? If Yes, ask: How bad is the cough?       no 6. FEVER: Do you have a fever? If Yes, ask: What is your temperature, how was it measured, and when did it start?     no 7. RESPIRATORY STATUS:  Describe your breathing? (e.g., normal; shortness of breath, wheezing, unable to speak)      No issue 8. BETTER-SAME-WORSE: Are you getting better, staying the same or getting worse compared to yesterday?  If getting worse, ask, In what way?     worse 9. OTHER SYMPTOMS: Do you have any other symptoms?  (e.g., chills, fatigue, headache, loss of smell or taste, muscle pain, sore throat)     Chills Ba, fatigue 10. HIGH RISK DISEASE: Do you have any chronic medical problems? (e.g., asthma, heart or lung disease, weak immune system, obesity, etc.)       No anemia  Protocols used: Coronavirus (COVID-19) Diagnosed or Suspected-A-AH

## 2024-05-21 ENCOUNTER — Encounter: Payer: Self-pay | Admitting: Family Medicine

## 2024-05-21 ENCOUNTER — Ambulatory Visit (INDEPENDENT_AMBULATORY_CARE_PROVIDER_SITE_OTHER): Admitting: Family Medicine

## 2024-05-21 VITALS — BP 133/76 | HR 71 | Temp 98.4°F | Ht 63.0 in | Wt 147.0 lb

## 2024-05-21 DIAGNOSIS — U071 COVID-19: Secondary | ICD-10-CM

## 2024-05-21 MED ORDER — NIRMATRELVIR/RITONAVIR (PAXLOVID)TABLET: 3.0000 | ORAL_TABLET | Freq: Two times a day (BID) | ORAL | 0 refills | Status: AC

## 2024-05-21 MED ORDER — AZELASTINE HCL 0.1 % NA SOLN: 1.0000 | Freq: Two times a day (BID) | NASAL | 12 refills | Status: DC

## 2024-05-21 NOTE — Telephone Encounter (Signed)
 She is welcome to do virtual during lunch if that is easier for her. No problem. Just let me know what phone to send text link to

## 2024-05-21 NOTE — Progress Notes (Signed)
 Subjective: RR:RNCPI 19 PCP: Jolinda Norene HERO, DO YEP:Jasmin Butler is a 73 y.o. female presenting to clinic today for:  1. COVID 19 Reports onset of some generalized aching and rhinorrhea with headache yesterday evening.  She just got back from a trip where she was in the [place] traveling with multiple other people's.  However, no known exposures.  No measured fevers.  No shortness of breath, wheezing, chest pain, nausea, vomiting or diarrhea.  She is been utilizing Tylenol  for headache.  She tested positive for COVID yesterday evening.   ROS: Per HPI  Allergies  Allergen Reactions   Alpha-Gal    Penicillins Rash    Did it involve swelling of the face/tongue/throat, SOB, or low BP? Yes Did it involve sudden or severe rash/hives, skin peeling, or any reaction on the inside of your mouth or nose? Unk Did you need to seek medical attention at a hospital or doctor's office? Yes When did it last happen? 30 years ago If all above answers are "NO", may proceed with cephalosporin use.    Past Medical History:  Diagnosis Date   Allergy 1975   Penicillin   Allergy to alpha-gal    Anxiety    Arthritis 1995   Spirs in neck   Cataract    Depression    couple times/yr (09/29/2014)   GERD (gastroesophageal reflux disease) 2005   Headache    weekly (09/29/2014)   History of hiatal hernia    Hyperlipidemia    IBS (irritable bowel syndrome)    Migraine    2-3 times/yr (09/29/2014)   Pernicious anemia    Pneumonia 1980's   double   Thyroid  disease    Urticaria     Current Outpatient Medications:    cyanocobalamin  (,VITAMIN B-12,) 1000 MCG/ML injection, Inject 1,000 mcg into the muscle every 30 (thirty) days. , Disp: , Rfl:    cyclobenzaprine  (FLEXERIL ) 5 MG tablet, Take 1 tablet (5 mg total) by mouth 3 (three) times daily as needed for muscle spasms., Disp: 30 tablet, Rfl: 1   EPINEPHrine  (EPIPEN  2-PAK) 0.3 mg/0.3 mL IJ SOAJ injection, Inject 0.3 mg into the muscle  as needed for anaphylaxis., Disp: 1 each, Rfl: 2   famotidine  (PEPCID ) 20 MG tablet, Take 1 tablet (20 mg total) by mouth 2 (two) times daily as needed for heartburn or indigestion., Disp: 180 tablet, Rfl: 3   hydrOXYzine  (VISTARIL ) 25 MG capsule, Take 1 capsule (25 mg total) by mouth every 8 (eight) hours as needed., Disp: 30 capsule, Rfl: 0   levothyroxine  (SYNTHROID ) 75 MCG tablet, Take 1 tablet (75 mcg total) by mouth daily., Disp: 90 tablet, Rfl: 2   linaclotide  (LINZESS ) 145 MCG CAPS capsule, Take 1 capsule (145 mcg total) by mouth daily before breakfast. Please inform that insurance will not cover Trulance , Disp: 90 capsule, Rfl: 3   mirtazapine  (REMERON ) 30 MG tablet, Take 1 tablet (30 mg total) by mouth at bedtime., Disp: 90 tablet, Rfl: 3   rosuvastatin  (CRESTOR ) 20 MG tablet, Take 1 tablet (20 mg total) by mouth daily. (Patient not taking: Reported on 04/13/2024), Disp: 90 tablet, Rfl: 3  Current Facility-Administered Medications:    cyanocobalamin  ((VITAMIN B-12)) injection 1,000 mcg, 1,000 mcg, Intramuscular, Q30 days, Dishon Kehoe M, DO, 1,000 mcg at 05/20/24 1003 Social History   Socioeconomic History   Marital status: Widowed    Spouse name: Not on file   Number of children: 3   Years of education: GED   Highest education level: GED  or equivalent  Occupational History   Occupation: RetiredChief Executive Officer  Tobacco Use   Smoking status: Never   Smokeless tobacco: Never  Vaping Use   Vaping status: Never Used  Substance and Sexual Activity   Alcohol use: No   Drug use: Never   Sexual activity: Not Currently  Other Topics Concern   Not on file  Social History Narrative   2 story home   Her son lives with her   Very independent   Does not believe in annual screenings   Social Drivers of Health   Financial Resource Strain: Low Risk  (05/20/2024)   Overall Financial Resource Strain (CARDIA)    Difficulty of Paying Living Expenses: Not hard at all  Food Insecurity:  No Food Insecurity (05/20/2024)   Hunger Vital Sign    Worried About Running Out of Food in the Last Year: Never true    Ran Out of Food in the Last Year: Never true  Transportation Needs: No Transportation Needs (05/20/2024)   PRAPARE - Administrator, Civil Service (Medical): No    Lack of Transportation (Non-Medical): No  Physical Activity: Sufficiently Active (05/20/2024)   Exercise Vital Sign    Days of Exercise per Week: 5 days    Minutes of Exercise per Session: 30 min  Stress: No Stress Concern Present (05/20/2024)   [name] of Occupational Health - Occupational Stress Questionnaire    Feeling of Stress: Not at all  Social Connections: Moderately Integrated (05/20/2024)   Social Connection and Isolation Panel    Frequency of Communication with Friends and Family: More than three times a week    Frequency of Social Gatherings with Friends and Family: More than three times a week    Attends Religious Services: 1 to 4 times per year    Active Member of [employer] or Organizations: Yes    Attends Banker Meetings: 1 to 4 times per year    Marital Status: Widowed  Intimate Partner Violence: Not At Risk (04/11/2024)   Received from [facility]   HITS    Over the last 12 months how often did your partner physically hurt you?: Never    Over the last 12 months how often did your partner insult you or talk down to you?: Never    Over the last 12 months how often did your partner threaten you with physical harm?: Never    Over the last 12 months how often did your partner scream or curse at you?: Never   Family History  Problem Relation Age of Onset   Leukemia Father    Cancer Father        leukemia   Hypertension Mother    Stroke Mother 77   Arthritis Mother    Cancer Mother    Cancer Brother        4 different cancers   Asthma Brother    Hearing loss Sister    Birth defects Son    Early death Son    Arthritis Brother    Cancer Daughter      Objective: Office vital signs reviewed. BP 133/76   Pulse 71   Temp 98.4 F (36.9 C)   Ht 5' 3 (1.6 m)   Wt 147 lb (66.7 kg)   SpO2 95%   BMI 26.04 kg/m   Physical Examination:  General: Awake, alert, well nourished, No acute distress HEENT: Normal    Neck: No masses palpated. No lymphadenopathy    Ears:  Tympanic membranes intact, normal light reflex, no erythema, no bulging    Eyes: PERRLA, extraocular membranes intact, sclera white    Nose: nasal turbinates moist, clear nasal discharge    Throat: moist mucus membranes, no erythema, no tonsillar exudate.  Airway is patent Cardio: regular rate and rhythm, S1S2 heard, no murmurs appreciated Pulm: clear to auscultation bilaterally, no wheezes, rhonchi or rales; normal work of breathing on room air  Assessment/ Plan: 73 y.o. female   COVID-19 - Plan: Novel Coronavirus, NAA ([facility]), nirmatrelvir /ritonavir  (PAXLOVID ) 20 x 150 MG & 10 x 100MG  TABS, azelastine  (ASTELIN ) 0.1 % nasal spray  Going to treat based on positive test at home though we did repeat a confirmatory test because she does not feel confident about her home testing.  I advised her to hold her Crestor  while taking Paxlovid .  Astelin  nasal spray sent.  Home care instructions reviewed and reason for reevaluation discussed.  Follow-up as needed   Norene CHRISTELLA Fielding, DO [facility] [phone]

## 2024-05-21 NOTE — Telephone Encounter (Signed)
 I called and spoke with patient and she wants to come in to be seen because she doesn't know how to do the video visits.

## 2024-05-22 LAB — NOVEL CORONAVIRUS, NAA: SARS-CoV-2, NAA: DETECTED — AB

## 2024-05-24 ENCOUNTER — Ambulatory Visit: Payer: Self-pay | Admitting: Family Medicine

## 2024-06-16 ENCOUNTER — Encounter: Payer: Self-pay | Admitting: Family Medicine

## 2024-06-16 DIAGNOSIS — R058 Other specified cough: Secondary | ICD-10-CM

## 2024-06-16 MED ORDER — BENZONATATE 100 MG PO CAPS: 100.0000 mg | ORAL_CAPSULE | Freq: Three times a day (TID) | ORAL | 2 refills | Status: DC | PRN

## 2024-06-17 ENCOUNTER — Ambulatory Visit (INDEPENDENT_AMBULATORY_CARE_PROVIDER_SITE_OTHER)

## 2024-06-17 ENCOUNTER — Ambulatory Visit: Payer: Self-pay

## 2024-06-17 DIAGNOSIS — E538 Deficiency of other specified B group vitamins: Secondary | ICD-10-CM | POA: Diagnosis not present

## 2024-06-17 NOTE — Telephone Encounter (Signed)
 FYI Only or Action Required?: FYI only for provider.  Patient was last seen in primary care on 05/21/2024 by Jolinda Norene HERO, DO.  Called Nurse Triage reporting URI.  Symptoms began several weeks ago.  Interventions attempted: Rest, hydration, or home remedies.  Symptoms are: unchanged.  Triage Disposition: See PCP When Office is Open (Within 3 Days)  Patient/caregiver understands and will follow disposition?: Yes      Copied from CRM #[id number]. Topic: Clinical - Red Word Triage >> Jun 17, 2024  8:26 AM Emylou G wrote: [facility] that prompted transfer to Nurse Triage: cold, low grade fever.. feels it in her throat and chest.. no aches or pains.. feels like the flu cough is keeping her up at night ( worsening ) Reason for Disposition  [1] Nasal discharge AND [2] present > 10 days  Answer Assessment - Initial Assessment Questions 1. ONSET: When did the nasal discharge start?      A few weeks - endorses was COVID + 3 weeks ago, and sx never fully went away, and now worsening 2. AMOUNT: How much discharge is there?      Not much 3. COUGH: Do you have a cough? If Yes, ask: Describe the color of your mucus. (e.g., clear, white, yellow, green)     Yes, yellow 4. RESPIRATORY DISTRESS: Describe your breathing.      denies 5. FEVER: Do you have a fever? If Yes, ask: What is your temperature, how was it measured, and when did it start?     Low grade 99.2 6. SEVERITY: Overall, how bad are you feeling right now? (e.g., doesn't interfere with normal activities, staying home from school/work, staying in bed)      Affecting ADL 7. OTHER SYMPTOMS: Do you have any other symptoms? (e.g., earache, mouth sores, sore throat, wheezing)     Headache, cough, sore throat Endorses COVID - at home 8. PREGNANCY: Is there any chance you are pregnant? When was your last menstrual period?     N/a    Pt endorses she has a INJ appt today at [facility], but no access to AV today. Scheduled  for tomorrow.  Protocols used: Common Cold-A-AH

## 2024-06-17 NOTE — Progress Notes (Signed)
 Patient is in office today for a nurse visit for B12 Injection. Patient Injection was given in the  Right deltoid. Patient tolerated injection well.

## 2024-06-17 NOTE — Telephone Encounter (Signed)
 Apt scheduled.

## 2024-06-18 ENCOUNTER — Ambulatory Visit: Admitting: Family Medicine

## 2024-06-18 ENCOUNTER — Encounter: Payer: Self-pay | Admitting: Family Medicine

## 2024-06-18 ENCOUNTER — Ambulatory Visit

## 2024-06-18 VITALS — BP 141/88 | HR 75 | Temp 99.2°F | Ht 63.0 in | Wt 147.8 lb

## 2024-06-18 DIAGNOSIS — J209 Acute bronchitis, unspecified: Secondary | ICD-10-CM

## 2024-06-18 MED ORDER — AZITHROMYCIN 250 MG PO TABS: ORAL_TABLET | ORAL | 0 refills | Status: DC

## 2024-06-18 MED ORDER — PREDNISONE 20 MG PO TABS: 40.0000 mg | ORAL_TABLET | Freq: Every day | ORAL | 0 refills | Status: AC

## 2024-06-18 NOTE — Progress Notes (Signed)
 Acute Office Visit  Subjective:     Patient ID: Jasmin Butler, female    DOB: 1951-05-15, 73 y.o.   MRN: [id number]  Chief Complaint  Patient presents with   Cough    Cough    History of Present Illness   Jasmin Butler is a 73 year old female who presents with a persistent cough and severe sore throat following a COVID-19 infection three weeks ago.  Cough and sputum production - Persistent productive cough with yellow sputum - Cough has worsened since COVID-19 infection three weeks ago - No chest tightness, shortness of breath, or wheezing - Currently taking Tessalon  Perles and using a nasal spray from previous COVID-19 treatment  Pharyngitis and voice changes - Severe sore throat that is constant and noticeable with swallowing - Voice is affected - No ear pain, nasal congestion, or rhinorrhea - No recent exposure to individuals with streptococcal pharyngitis  Headache and constitutional symptoms - Intermittent frontal headache - No fever or chills   Pulmonary history - History of pneumonia and bronchitis - No history of asthma or COPD       Review of Systems  Respiratory:  Positive for cough.    As per HPI.      Objective:    BP (!) 141/88   Pulse 75   Temp 99.2 F (37.3 C) (Temporal)   Ht 5' 3 (1.6 m)   Wt 147 lb 12.8 oz (67 kg)   SpO2 96%   BMI 26.18 kg/m    Physical Exam Vitals and nursing note reviewed.  Constitutional:      General: She is not in acute distress.    Appearance: Normal appearance. She is not ill-appearing, toxic-appearing or diaphoretic.  HENT:     Right Ear: Tympanic membrane, ear canal and external ear normal.     Left Ear: Tympanic membrane, ear canal and external ear normal.     Nose: Nose normal.     Mouth/Throat:     Mouth: Mucous membranes are moist.     Pharynx: Oropharynx is clear. Posterior oropharyngeal erythema present. No pharyngeal swelling, oropharyngeal exudate, uvula swelling or postnasal drip.      Tonsils: No tonsillar exudate or tonsillar abscesses.  Cardiovascular:     Rate and Rhythm: Normal rate and regular rhythm.     Heart sounds: Normal heart sounds. No murmur heard. Pulmonary:     Effort: Pulmonary effort is normal. No respiratory distress.     Breath sounds: Normal breath sounds. No wheezing, rhonchi or rales.  Chest:     Chest wall: No tenderness.  Musculoskeletal:     Right lower leg: No edema.     Left lower leg: No edema.  Lymphadenopathy:     Cervical: No cervical adenopathy.  Skin:    General: Skin is warm and dry.  Neurological:     General: No focal deficit present.     Mental Status: She is alert and oriented to person, place, and time.  Psychiatric:        Mood and Affect: Mood normal.        Behavior: Behavior normal.     No results found for any visits on 06/18/24.      Assessment & Plan:   Yanitza was seen today for cough.  Diagnoses and all orders for this visit:  Acute bronchitis, unspecified organism -     predniSONE  (DELTASONE ) 20 MG tablet; Take 2 tablets (40 mg total) by mouth daily with breakfast for 5  days. -     azithromycin  (ZITHROMAX  Z-PAK) 250 MG tablet; As directed   Acute bronchitis Likely secondary to recent COVID-19 infection. Lungs clear on exam today. No shortness of breath or wheezing. History of bronchitis. - Prescribed prednisone , 2 tablets once daily for 5 days. - Continue Tessalon  Perles. - Prescribed Z-Pak (azithromycin ) if symptoms do not improve by Sunday or worsen.     Return to office for new or worsening symptoms, or if symptoms persist.   The patient indicates understanding of these issues and agrees with the plan.   Annabella CHRISTELLA Search, FNP

## 2024-06-22 ENCOUNTER — Other Ambulatory Visit: Payer: Self-pay | Admitting: Family Medicine

## 2024-07-07 ENCOUNTER — Encounter: Payer: Self-pay | Admitting: Family Medicine

## 2024-07-07 ENCOUNTER — Ambulatory Visit (INDEPENDENT_AMBULATORY_CARE_PROVIDER_SITE_OTHER): Admitting: Family Medicine

## 2024-07-07 VITALS — BP 135/87 | HR 82 | Temp 98.0°F | Ht 63.0 in | Wt 146.2 lb

## 2024-07-07 DIAGNOSIS — J029 Acute pharyngitis, unspecified: Secondary | ICD-10-CM | POA: Diagnosis not present

## 2024-07-07 DIAGNOSIS — R591 Generalized enlarged lymph nodes: Secondary | ICD-10-CM | POA: Diagnosis not present

## 2024-07-07 DIAGNOSIS — J301 Allergic rhinitis due to pollen: Secondary | ICD-10-CM

## 2024-07-07 DIAGNOSIS — R059 Cough, unspecified: Secondary | ICD-10-CM | POA: Diagnosis not present

## 2024-07-07 MED ORDER — CEFDINIR 300 MG PO CAPS: 300.0000 mg | ORAL_CAPSULE | Freq: Two times a day (BID) | ORAL | 0 refills | Status: DC

## 2024-07-07 NOTE — Progress Notes (Signed)
 Subjective:  Patient ID: Ronal Jenkins Rung, female    DOB: May 12, 1951, 73 y.o.   MRN: [id number]  Patient Care Team: Jolinda Norene HERO, DO as PCP - General (Family Medicine) Lavona Agent, MD as PCP - Cardiology (Cardiology) [facility]   Chief Complaint:  Sore Throat (Since having COVID 2 months ago ) and Cough (Since having COVID 2 months ago )   HPI: Shaquaya Wuellner is a 73 y.o. female presenting on 07/07/2024 for Sore Throat (Since having COVID 2 months ago ) and Cough (Since having COVID 2 months ago )   Tanji Storrs is a 73 year old female who presents with a persistent sore throat and cough following a COVID-19 infection.  She has experienced a sore throat since contracting COVID-19 two months ago. Two weeks ago, she was diagnosed with bronchitis, but the prescribed medication did not alleviate her symptoms.  She has a nagging cough that occurs throughout the day and becomes worse when she lies down at night. She uses a throat spray to numb her throat, describing it as tasting like 'pure lime salt', and Ricola lozenges for relief.  She takes an over-the-counter allergy medication daily, which she has been using for at least a month. She is unsure of the exact name but describes it as a generic version of medications like Claritin  or Xyzal.  No fever, chills, or shortness of breath, but she notes occasional swelling of the throat and lymph nodes. She mentions coughing up mucus, occasionally tinged with red, attributing this to the throat spray.  She has a history of being advised to have her tonsils removed as a child due to frequent throat issues, but she opted against it. She is allergic to penicillins, having experienced a severe rash approximately forty years ago.          Relevant past medical, surgical, family, and social history reviewed and updated as indicated.  Allergies and medications reviewed and updated. Data reviewed: Chart in  Epic.   Past Medical History:  Diagnosis Date   Allergy 1975   Penicillin   Allergy to alpha-gal    Anxiety    Arthritis 1995   Spirs in neck   Cataract    Depression    couple times/yr (09/29/2014)   GERD (gastroesophageal reflux disease) 2005   Headache    weekly (09/29/2014)   History of hiatal hernia    Hyperlipidemia    IBS (irritable bowel syndrome)    Migraine    2-3 times/yr (09/29/2014)   Pernicious anemia    Pneumonia 1980's   double   Thyroid  disease    Urticaria     Past Surgical History:  Procedure Laterality Date   ABDOMINAL HYSTERECTOMY  1985   CARDIAC CATHETERIZATION  ~ 2005   CATARACT EXTRACTION W/ INTRAOCULAR LENS  IMPLANT, BILATERAL Bilateral 2000's   CHOLECYSTECTOMY OPEN  ~ 1990   EYE SURGERY     TUBAL LIGATION  ~ 1983    Social History   Socioeconomic History   Marital status: Widowed    Spouse name: Not on file   Number of children: 3   Years of education: GED   Highest education level: GED or equivalent  Occupational History   Occupation: Retired- Education officer, environmental  Tobacco Use   Smoking status: Never   Smokeless tobacco: Never  Vaping Use   Vaping status: Never Used  Substance and Sexual Activity   Alcohol use: No   Drug  use: Never   Sexual activity: Not Currently  Other Topics Concern   Not on file  Social History Narrative   2 story home   Her son lives with her   Very independent   Does not believe in annual screenings   Social Drivers of Health   Financial Resource Strain: Low Risk  (05/20/2024)   Overall Financial Resource Strain (CARDIA)    Difficulty of Paying Living Expenses: Not hard at all  Food Insecurity: No Food Insecurity (05/20/2024)   Hunger Vital Sign    Worried About Running Out of Food in the Last Year: Never true    Ran Out of Food in the Last Year: Never true  Transportation Needs: No Transportation Needs (05/20/2024)   PRAPARE - Administrator, Civil Service (Medical): No    Lack of  Transportation (Non-Medical): No  Physical Activity: Sufficiently Active (05/20/2024)   Exercise Vital Sign    Days of Exercise per Week: 5 days    Minutes of Exercise per Session: 30 min  Stress: No Stress Concern Present (05/20/2024)   [name] of Occupational Health - Occupational Stress Questionnaire    Feeling of Stress: Not at all  Social Connections: Moderately Integrated (05/20/2024)   Social Connection and Isolation Panel    Frequency of Communication with Friends and Family: More than three times a week    Frequency of Social Gatherings with Friends and Family: More than three times a week    Attends Religious Services: 1 to 4 times per year    Active Member of [employer] or Organizations: Yes    Attends Banker Meetings: 1 to 4 times per year    Marital Status: Widowed  Intimate Partner Violence: Not At Risk (04/11/2024)   Received from [facility]   HITS    Over the last 12 months how often did your partner physically hurt you?: Never    Over the last 12 months how often did your partner insult you or talk down to you?: Never    Over the last 12 months how often did your partner threaten you with physical harm?: Never    Over the last 12 months how often did your partner scream or curse at you?: Never    Outpatient Encounter Medications as of 07/07/2024  Medication Sig   cefdinir  (OMNICEF ) 300 MG capsule Take 1 capsule (300 mg total) by mouth 2 (two) times daily. 1 po BID   cyanocobalamin  (,VITAMIN B-12,) 1000 MCG/ML injection Inject 1,000 mcg into the muscle every 30 (thirty) days.    cyclobenzaprine  (FLEXERIL ) 5 MG tablet Take 1 tablet (5 mg total) by mouth 3 (three) times daily as needed for muscle spasms.   EPINEPHRINE  0.3 mg/0.3 mL IJ SOAJ injection INJECT 0.3 MG INTO THE MUSCLE AS NEEDED FOR ANAPHYLAXIS.   famotidine  (PEPCID ) 20 MG tablet Take 1 tablet (20 mg total) by mouth 2 (two) times daily as needed for heartburn or indigestion.   levothyroxine   (SYNTHROID ) 75 MCG tablet Take 1 tablet (75 mcg total) by mouth daily.   linaclotide  (LINZESS ) 145 MCG CAPS capsule Take 1 capsule (145 mcg total) by mouth daily before breakfast. Please inform that insurance will not cover Trulance    mirtazapine  (REMERON ) 30 MG tablet Take 1 tablet (30 mg total) by mouth at bedtime.   [DISCONTINUED] azithromycin  (ZITHROMAX  Z-PAK) 250 MG tablet As directed   [DISCONTINUED] benzonatate  (TESSALON  PERLES) 100 MG capsule Take 1 capsule (100 mg total) by mouth 3 (three) times  daily as needed.   [DISCONTINUED] rosuvastatin  (CRESTOR ) 20 MG tablet Take 1 tablet (20 mg total) by mouth daily. (Patient not taking: Reported on 06/18/2024)   Facility-Administered Encounter Medications as of 07/07/2024  Medication   cyanocobalamin  ((VITAMIN B-12)) injection 1,000 mcg    Allergies  Allergen Reactions   Alpha-Gal    Penicillins Rash    Did it involve swelling of the face/tongue/throat, SOB, or low BP? Yes Did it involve sudden or severe rash/hives, skin peeling, or any reaction on the inside of your mouth or nose? Unk Did you need to seek medical attention at a hospital or doctor's office? Yes When did it last happen? 30 years ago If all above answers are "NO", may proceed with cephalosporin use.     Pertinent ROS per HPI, otherwise unremarkable      Objective:  BP 135/87   Pulse 82   Temp 98 F (36.7 C)   Ht 5' 3 (1.6 m)   Wt 146 lb 3.2 oz (66.3 kg)   SpO2 95%   BMI 25.90 kg/m    Wt Readings from Last 3 Encounters:  07/07/24 146 lb 3.2 oz (66.3 kg)  06/18/24 147 lb 12.8 oz (67 kg)  05/21/24 147 lb (66.7 kg)    Physical Exam Vitals and nursing note reviewed.  Constitutional:      Appearance: Normal appearance. She is well-developed and normal weight.  HENT:     Head: Normocephalic and atraumatic.     Right Ear: Tympanic membrane, ear canal and external ear normal.     Left Ear: Tympanic membrane, ear canal and external ear normal.     Nose: Nose  normal.     Mouth/Throat:     Mouth: Mucous membranes are moist.     Pharynx: Oropharynx is clear. Posterior oropharyngeal erythema present.  Eyes:     Conjunctiva/sclera: Conjunctivae normal.     Pupils: Pupils are equal, round, and reactive to light.  Cardiovascular:     Rate and Rhythm: Normal rate and regular rhythm.     Heart sounds: Normal heart sounds.  Pulmonary:     Effort: Pulmonary effort is normal.     Breath sounds: Normal breath sounds.  Lymphadenopathy:     Cervical: Cervical adenopathy present.  Skin:    General: Skin is warm and dry.     Capillary Refill: Capillary refill takes less than 2 seconds.  Neurological:     General: No focal deficit present.     Mental Status: She is alert and oriented to person, place, and time.  Psychiatric:        Mood and Affect: Mood normal.        Behavior: Behavior normal.        Thought Content: Thought content normal.        Judgment: Judgment normal.     HEENT: Throat red with irritation and tonsil stones. NECK: Lymph nodes swollen bilaterally.        Results for orders placed or performed in visit on 05/21/24  Novel Coronavirus, NAA ([facility])   Collection Time: 05/21/24  3:34 PM   Specimen: Nasopharyngeal(NP) swabs in vial transport medium  Result Value Ref Range   SARS-CoV-2, NAA Detected (A) Not Detected       Pertinent labs & imaging results that were available during my care of the patient were reviewed by me and considered in my medical decision making.  Assessment & Plan:  Acelyn was seen today for sore throat and cough.  Diagnoses  and all orders for this visit:  Sore throat -     cefdinir  (OMNICEF ) 300 MG capsule; Take 1 capsule (300 mg total) by mouth 2 (two) times daily. 1 po BID  Cough in adult -     cefdinir  (OMNICEF ) 300 MG capsule; Take 1 capsule (300 mg total) by mouth 2 (two) times daily. 1 po BID  Lymphadenopathy -     cefdinir  (OMNICEF ) 300 MG capsule; Take 1 capsule (300 mg total) by mouth  2 (two) times daily. 1 po BID  Seasonal allergic rhinitis due to pollen       Acute bacterial pharyngitis Persistent sore throat since COVID-19 infection two months ago, with recent exacerbation. Swollen lymph nodes bilaterally, redness, and irritation in the throat. Presence of tonsil stones noted, but not concerning. Recent treatment with azithromycin  was ineffective. Allergic to penicillins with a rash 40 years ago. Decision to use cefdinir  for broader coverage due to recent azithromycin  use. - Prescribe cefdinir  for bacterial pharyngitis. - Recommend Cepacol sore throat lozenges with benzocaine for throat numbing and cough relief. Caution against overuse due to potential side effects. - Continue current allergy medication.  Allergic rhinitis Chronic allergic rhinitis managed with over-the-counter generic allergy medication for approximately one year. Current medication may not be effective in controlling symptoms, contributing to persistent cough, especially at night. - Consider switching to a different over-the-counter allergy medication such as generic Zyrtec , Claritin , or Xyzal if symptoms persist. - If symptoms do not improve, consider adding Singulair to the treatment regimen.          Continue all other maintenance medications.  Follow up plan: Return if symptoms worsen or fail to improve.   Continue healthy lifestyle choices, including diet (rich in fruits, vegetables, and lean proteins, and low in salt and simple carbohydrates) and exercise (at least 30 minutes of moderate physical activity daily).   The above assessment and management plan was discussed with the patient. The patient verbalized understanding of and has agreed to the management plan. Patient is aware to call the clinic if they develop any new symptoms or if symptoms persist or worsen. Patient is aware when to return to the clinic for a follow-up visit. Patient educated on when it is appropriate to go to the  emergency department.   Rosaline Bruns, FNP-C [facility] [phone]

## 2024-07-07 NOTE — Patient Instructions (Signed)
 Cepachol sore throat

## 2024-07-16 ENCOUNTER — Ambulatory Visit

## 2024-07-21 ENCOUNTER — Ambulatory Visit (INDEPENDENT_AMBULATORY_CARE_PROVIDER_SITE_OTHER): Admitting: *Deleted

## 2024-07-21 DIAGNOSIS — E538 Deficiency of other specified B group vitamins: Secondary | ICD-10-CM | POA: Diagnosis not present

## 2024-07-21 DIAGNOSIS — Z23 Encounter for immunization: Secondary | ICD-10-CM

## 2024-07-21 NOTE — Progress Notes (Signed)
 Patient is in office today for a nurse visit for B12 Injection. Patient Injection was given in the  Left deltoid. Patient tolerated injection well.

## 2024-08-17 ENCOUNTER — Encounter: Payer: Self-pay | Admitting: Family Medicine

## 2024-08-19 ENCOUNTER — Ambulatory Visit (INDEPENDENT_AMBULATORY_CARE_PROVIDER_SITE_OTHER): Payer: Self-pay

## 2024-08-19 DIAGNOSIS — E538 Deficiency of other specified B group vitamins: Secondary | ICD-10-CM | POA: Diagnosis not present

## 2024-08-19 NOTE — Progress Notes (Signed)
 Patient is in office today for a nurse visit for B12 Injection. Patient Injection was given in the  Right deltoid. Patient tolerated injection well.

## 2024-08-20 ENCOUNTER — Ambulatory Visit

## 2024-08-31 ENCOUNTER — Encounter: Payer: Self-pay | Admitting: Family Medicine

## 2024-08-31 ENCOUNTER — Ambulatory Visit: Admitting: Family Medicine

## 2024-08-31 VITALS — BP 140/90 | HR 56 | Temp 97.4°F | Ht 63.0 in | Wt 147.4 lb

## 2024-08-31 DIAGNOSIS — T7840XA Allergy, unspecified, initial encounter: Secondary | ICD-10-CM | POA: Diagnosis not present

## 2024-08-31 DIAGNOSIS — R7303 Prediabetes: Secondary | ICD-10-CM

## 2024-08-31 DIAGNOSIS — E538 Deficiency of other specified B group vitamins: Secondary | ICD-10-CM

## 2024-08-31 DIAGNOSIS — Z91018 Allergy to other foods: Secondary | ICD-10-CM | POA: Diagnosis not present

## 2024-08-31 DIAGNOSIS — E034 Atrophy of thyroid (acquired): Secondary | ICD-10-CM | POA: Diagnosis not present

## 2024-08-31 LAB — BAYER DCA HB A1C WAIVED: HB A1C (BAYER DCA - WAIVED): 6 % — ABNORMAL HIGH (ref 4.8–5.6)

## 2024-08-31 NOTE — Patient Instructions (Addendum)
 Fasting labs in March with your physical.  Bone Density Test: What to Expect A bone density test uses a type of X-ray to measure the amount of calcium  and other minerals in your bones. It can measure bone density in the hip and the spine. This test may also be called: Bone densitometry. Bone mineral density test. Dual-energy X-ray absorptiometry (DEXA). You may have this test to: Diagnose or screen for a condition that causes weak or thin bones, called osteoporosis. See what your risk is for a broken bone, also called a fracture. Check how well your treatment for weak or thin bones is working. The test is similar to having a regular X-ray. Tell a health care provider about: Any allergies you have. All medicines you're taking, including vitamins, herbs, eye drops, creams, and over-the-counter medicines. Any problems you or family members have had with anesthesia. Any bleeding problems you have. Any surgeries you've had. Any medical conditions you have. Whether you're pregnant or may be pregnant. Any medical tests you've had within the past 14 days that used contrast. What are the risks? Your health care provider will talk with you about risks. These may include: Being exposed to a small amount of radiation. This can slightly increase your cancer risk. What happens before the test? Do not take any calcium  supplements within the 24 hours before your test. You'll need to take off: All metal jewelry. Eyeglasses. Removable dental appliances. Any other metal objects on your body. What happens during the test?  You'll lie down on an exam table. There will be an X-ray machine below you and an imaging device above you. Other devices, such as boxes or braces, may be used to position your body for the scan. The machine will slowly scan your body. You'll need to keep very still while the machine does the scan. The images will show up on a screen in the room. Images will be checked by a  specialist after your test is finished. These steps may vary. Ask what you can expect. What can I expect after the test? Ask when your results will be ready and how to get them. You may need to call or meet with your provider to get your results. This information is not intended to replace advice given to you by your health care provider. Make sure you discuss any questions you have with your health care provider. Document Revised: 02/09/2023 Document Reviewed: 02/09/2023 Elsevier Patient Education  2024 [employer].

## 2024-08-31 NOTE — Progress Notes (Signed)
 Subjective: CC: Follow-up vitamin B12 deficiency, fatigue PCP: Jolinda Norene HERO, DO YEP:Jasmin Butler is a 73 y.o. female presenting to clinic today for:  Patient reports that she gets really fatigued at the end of her B12 cycle.  She notes that she typically gets her shot around the first week of the month but towards the end of the month she almost gets to where she cannot really walk very far without having to take a break.  Asking for handicap placard for this today.  She reports compliance with her Synthroid  and denies any other concerning features like changes in bowel habits, hair thinning etc.  She does report increased stress related to her daughter, who is 78 years old, and has stage IV lung cancer.  Sadly, there is no cure.  She reports she recently had an allergic reaction after eating Jasmin Butler.  She has known alpha gal but did not have any beef containing products that she knows of.  She also started having some type of reaction after consuming a milkshake recently.  Wondering if maybe she has a dairy allergy in addition to the alpha gal.  Home blood pressures are typically running 130s over 70s.  No chest pain, shortness of breath or edema reported.   ROS: Per HPI  Allergies  Allergen Reactions   Alpha-Gal    Penicillins Rash    Did it involve swelling of the face/tongue/throat, SOB, or low BP? Yes Did it involve sudden or severe rash/hives, skin peeling, or any reaction on the inside of your mouth or nose? Unk Did you need to seek medical attention at a hospital or doctor's office? Yes When did it last happen? 30 years ago If all above answers are "NO", may proceed with cephalosporin use.    Past Medical History:  Diagnosis Date   Allergy 1975   Penicillin   Allergy to alpha-gal    Anxiety    Arthritis 1995   Spirs in neck   Cataract    Depression    couple times/yr (09/29/2014)   GERD (gastroesophageal reflux disease) 2005   Headache    weekly  (09/29/2014)   History of hiatal hernia    Hyperlipidemia    IBS (irritable bowel syndrome)    Migraine    2-3 times/yr (09/29/2014)   Pernicious anemia    Pneumonia 1980's   double   Thyroid  disease    Urticaria     Current Outpatient Medications:    cyanocobalamin  (,VITAMIN B-12,) 1000 MCG/ML injection, Inject 1,000 mcg into the muscle every 30 (thirty) days. , Disp: , Rfl:    EPINEPHRINE  0.3 mg/0.3 mL IJ SOAJ injection, INJECT 0.3 MG INTO THE MUSCLE AS NEEDED FOR ANAPHYLAXIS., Disp: 2 each, Rfl: 2   famotidine  (PEPCID ) 20 MG tablet, Take 1 tablet (20 mg total) by mouth 2 (two) times daily as needed for heartburn or indigestion., Disp: 180 tablet, Rfl: 3   levothyroxine  (SYNTHROID ) 75 MCG tablet, Take 1 tablet (75 mcg total) by mouth daily., Disp: 90 tablet, Rfl: 2   linaclotide  (LINZESS ) 145 MCG CAPS capsule, Take 1 capsule (145 mcg total) by mouth daily before breakfast. Please inform that insurance will not cover Trulance , Disp: 90 capsule, Rfl: 3   mirtazapine  (REMERON ) 30 MG tablet, Take 1 tablet (30 mg total) by mouth at bedtime., Disp: 90 tablet, Rfl: 3  Current Facility-Administered Medications:    cyanocobalamin  ((VITAMIN B-12)) injection 1,000 mcg, 1,000 mcg, Intramuscular, Q30 days, Emelie Newsom M, DO, 1,000 mcg at  08/19/24 1015 Social History   Socioeconomic History   Marital status: Widowed    Spouse name: Not on file   Number of children: 3   Years of education: GED   Highest education level: GED or equivalent  Occupational History   Occupation: Retired- education officer, environmental  Tobacco Use   Smoking status: Never   Smokeless tobacco: Never  Vaping Use   Vaping status: Never Used  Substance and Sexual Activity   Alcohol use: No   Drug use: Never   Sexual activity: Not Currently  Other Topics Concern   Not on file  Social History Narrative   2 story home   Her son lives with her   Very independent   Does not believe in annual screenings   Social Drivers  of Health   Financial Resource Strain: Low Risk  (08/27/2024)   Overall Financial Resource Strain (CARDIA)    Difficulty of Paying Living Expenses: Not very hard  Food Insecurity: Food Insecurity Present (08/27/2024)   Hunger Vital Sign    Worried About Running Out of Food in the Last Year: Never true    Ran Out of Food in the Last Year: Sometimes true  Transportation Needs: No Transportation Needs (08/27/2024)   PRAPARE - Administrator, Civil Service (Medical): No    Lack of Transportation (Non-Medical): No  Physical Activity: Insufficiently Active (08/27/2024)   Exercise Vital Sign    Days of Exercise per Week: 7 days    Minutes of Exercise per Session: 20 min  Stress: No Stress Concern Present (08/27/2024)   [name] of Occupational Health - Occupational Stress Questionnaire    Feeling of Stress: Only a little  Social Connections: Moderately Isolated (08/27/2024)   Social Connection and Isolation Panel    Frequency of Communication with Friends and Family: More than three times a week    Frequency of Social Gatherings with Friends and Family: Twice a week    Attends Religious Services: 1 to 4 times per year    Active Member of [employer] or Organizations: No    Attends Banker Meetings: Not on file    Marital Status: Widowed  Intimate Partner Violence: Not At Risk (04/11/2024)   Received from [facility]   HITS    Over the last 12 months how often did your partner physically hurt you?: Never    Over the last 12 months how often did your partner insult you or talk down to you?: Never    Over the last 12 months how often did your partner threaten you with physical harm?: Never    Over the last 12 months how often did your partner scream or curse at you?: Never   Family History  Problem Relation Age of Onset   Leukemia Father    Cancer Father        leukemia   Hypertension Mother    Stroke Mother 51   Arthritis Mother    Cancer Mother     Cancer Brother        4 different cancers   Asthma Brother    Hearing loss Sister    Birth defects Son    Early death Son    Arthritis Brother    Cancer Daughter     Objective: Office vital signs reviewed. BP (!) 140/90   Pulse (!) 56   Temp (!) 97.4 F (36.3 C)   Ht 5' 3 (1.6 m)   Wt 147 lb 6 oz (66.8 kg)  SpO2 96%   BMI 26.11 kg/m   Physical Examination:  General: Awake, alert, well nourished, No acute distress HEENT: sclera white, MMM. no Exophthalmos.  No goiter Cardio: Slightly bradycardic with regular rhythm, S1S2 heard, no murmurs appreciated Pulm: clear to auscultation bilaterally, no wheezes, rhonchi or rales; normal work of breathing on room air MSK: Normal gait and station.  Ambulating independently. Neuro: No tremor  Assessment/ Plan: 73 y.o. female   Allergy to alpha-gal - Plan: IgE Milk w/ Component Reflex  Allergic reaction, initial encounter - Plan: IgE Milk w/ Component Reflex  B12 deficiency - Plan: Vitamin B12  Hypothyroidism due to acquired atrophy of thyroid  - Plan: TSH + free T4  Prediabetes - Plan: Bayer DCA Hb A1c Waived   Lets check for dairy allergy.  Will also recheck B12 level per her request.  If we find that it is low mid months, then we may consider dosing her B12 twice monthly instead of every 30 days.  Will check thyroid  levels as well to make sure this is not contributing to intermittent fatigue symptoms  Prediabetes was not discussed today but will check A1c as she requested labs to be completed prior to her physical  Norene CHRISTELLA Fielding, DO [facility] [phone]

## 2024-09-01 ENCOUNTER — Ambulatory Visit: Payer: Self-pay | Admitting: Family Medicine

## 2024-09-03 LAB — IGE MILK W/ COMPONENT REFLEX: F002-IgE Milk: <0.1 kU/L

## 2024-09-03 LAB — VITAMIN B12: Vitamin B-12: 654 pg/mL (ref 232–1245)

## 2024-09-03 LAB — TSH+FREE T4
Free T4: 1.24 ng/dL (ref 0.82–1.77)
TSH: 0.599 u[IU]/mL (ref 0.450–4.500)

## 2024-09-07 ENCOUNTER — Ambulatory Visit (INDEPENDENT_AMBULATORY_CARE_PROVIDER_SITE_OTHER): Payer: Medicare HMO

## 2024-09-07 VITALS — BP 140/90 | HR 56 | Ht 63.0 in | Wt 147.0 lb

## 2024-09-07 DIAGNOSIS — Z0000 Encounter for general adult medical examination without abnormal findings: Secondary | ICD-10-CM | POA: Diagnosis not present

## 2024-09-07 NOTE — Progress Notes (Signed)
 Chief Complaint  Patient presents with   Medicare Wellness     Subjective:   Jasmin Butler is a 73 y.o. female who presents for a Medicare Annual Wellness Visit.  Allergies (verified) Alpha-gal and Penicillins   History: Past Medical History:  Diagnosis Date   Allergy 1975   Penicillin   Allergy to alpha-gal    Anxiety    Arthritis 1995   Spirs in neck   Cataract    Depression    couple times/yr (09/29/2014)   GERD (gastroesophageal reflux disease) 2005   Headache    weekly (09/29/2014)   History of hiatal hernia    Hyperlipidemia    IBS (irritable bowel syndrome)    Migraine    2-3 times/yr (09/29/2014)   Pernicious anemia    Pneumonia 1980's   double   Thyroid  disease    Urticaria    Past Surgical History:  Procedure Laterality Date   ABDOMINAL HYSTERECTOMY  1985   CARDIAC CATHETERIZATION  ~ 2005   CATARACT EXTRACTION W/ INTRAOCULAR LENS  IMPLANT, BILATERAL Bilateral 2000's   CHOLECYSTECTOMY OPEN  ~ 1990   EYE SURGERY     TUBAL LIGATION  ~ 1983   Family History  Problem Relation Age of Onset   Leukemia Father    Cancer Father        leukemia   Hypertension Mother    Stroke Mother 63   Arthritis Mother    Cancer Mother    Cancer Brother        4 different cancers   Asthma Brother    Hearing loss Brother    Hearing loss Sister    Birth defects Son    Early death Son    Arthritis Brother    Cancer Daughter    Social History   Occupational History   Occupation: Retired- education officer, environmental  Tobacco Use   Smoking status: Never   Smokeless tobacco: Never  Vaping Use   Vaping status: Never Used  Substance and Sexual Activity   Alcohol use: Never   Drug use: Never   Sexual activity: Not Currently   Tobacco Counseling Counseling given: Yes  SDOH Screenings   Food Insecurity: Food Insecurity Present (09/07/2024)  Housing: Low Risk  (09/07/2024)  Transportation Needs: No Transportation Needs (09/07/2024)  Utilities: Not At Risk  (09/07/2024)  Alcohol Screen: Low Risk  (09/01/2023)  Depression (PHQ2-9): Low Risk  (09/07/2024)  Financial Resource Strain: Low Risk  (08/27/2024)  Physical Activity: Insufficiently Active (09/07/2024)  Social Connections: Moderately Isolated (09/07/2024)  Stress: No Stress Concern Present (09/07/2024)  Tobacco Use: Low Risk  (09/07/2024)  Health Literacy: Adequate Health Literacy (09/07/2024)   See flowsheets for full screening details  Depression Screen PHQ 2 & 9 Depression Scale- Over the past 2 weeks, how often have you been bothered by any of the following problems? Little interest or pleasure in doing things: 0 Feeling down, depressed, or hopeless (PHQ Adolescent also includes...irritable): 0 PHQ-2 Total Score: 0 Trouble falling or staying asleep, or sleeping too much: 0 Feeling tired or having little energy: 0 Poor appetite or overeating (PHQ Adolescent also includes...weight loss): 0 Feeling bad about yourself - or that you are a failure or have let yourself or your family down: 0 Trouble concentrating on things, such as reading the newspaper or watching television (PHQ Adolescent also includes...like school work): 0 Moving or speaking so slowly that other people could have noticed. Or the opposite - being so fidgety or restless that you have  been moving around a lot more than usual: 0 Thoughts that you would be better off dead, or of hurting yourself in some way: 0 PHQ-9 Total Score: 0 If you checked off any problems, how difficult have these problems made it for you to do your work, take care of things at home, or get along with other people?: Not difficult at all     Goals Addressed             This Visit's Progress    Patient Stated   On track    Remain active and independent       Visit info / Clinical Intake: Medicare Wellness Visit Type:: Subsequent Annual Wellness Visit Persons participating in visit:: patient Medicare Wellness Visit Mode:: Telephone If  telephone:: video declined Because this visit was a virtual/telehealth visit:: vitals recorded from last visit If Telephone or Video please confirm:: I connected with the patient using audio enabled telemedicine application and verified that I am speaking with the correct person using two identifiers Patient Location:: home Provider Location:: home office Information given by:: patient Interpreter Needed?: No Pre-visit prep was completed: yes AWV questionnaire completed by patient prior to visit?: yes Living arrangements:: with family/others Patient's Overall Health Status Rating: very good Typical amount of pain: none Does pain affect daily life?: no Are you currently prescribed opioids?: no  Dietary Habits and Nutritional Risks How many meals a day?: 3 Eats fruit and vegetables daily?: yes Most meals are obtained by: preparing own meals In the last 2 weeks, have you had any of the following?: none Diabetic:: no  Functional Status Activities of Daily Living (to include ambulation/medication): (Patient-Rptd) Independent Ambulation: (Patient-Rptd) Independent Medication Administration: Independent Home Management: (Patient-Rptd) Independent Manage your own finances?: yes Primary transportation is: driving Concerns about hearing?: no  Fall Screening Falls in the past year?: 0 Number of falls in past year: 0 Was there an injury with Fall?: 0 Fall Risk Category Calculator: 0 Patient Fall Risk Level: Low Fall Risk  Fall Risk Patient at Risk for Falls Due to: No Fall Risks Fall risk Follow up: Falls evaluation completed; Education provided  Home and Transportation Safety: All rugs have non-skid backing?: yes All stairs or steps have railings?: yes Grab bars in the bathtub or shower?: yes Have non-skid surface in bathtub or shower?: yes Good home lighting?: yes Regular seat belt use?: yes Hospital stays in the last year:: no  Cognitive Assessment Difficulty concentrating,  remembering, or making decisions? : no Will 6CIT or Mini Cog be Completed: yes What year is it?: 0 points What month is it?: 0 points Give patient an address phrase to remember (5 components): [address] [place] About what time is it?: 0 points Count backwards from 20 to 1: 0 points Say the months of the year in reverse: 0 points Repeat the address phrase from earlier: 0 points 6 CIT Score: 0 points  Advance Directives (For Healthcare) Does Patient Have a Medical Advance Directive?: No Would patient like information on creating a medical advance directive?: No - Patient declined  Reviewed/Updated  Reviewed/Updated: Reviewed All (Medical, Surgical, Family, Medications, Allergies, Care Teams, Patient Goals); Medical History; Surgical History; Family History; Medications; Allergies; Care Teams; Patient Goals        Objective:    Today's Vitals   09/07/24 1042  BP: (!) 140/90  Pulse: (!) 56  Weight: 147 lb (66.7 kg)  Height: 5' 3 (1.6 m)   Body mass index is 26.04 kg/m.  Current  Medications (verified) Outpatient Encounter Medications as of 09/07/2024  Medication Sig   cyanocobalamin  (,VITAMIN B-12,) 1000 MCG/ML injection Inject 1,000 mcg into the muscle every 30 (thirty) days.    EPINEPHRINE  0.3 mg/0.3 mL IJ SOAJ injection INJECT 0.3 MG INTO THE MUSCLE AS NEEDED FOR ANAPHYLAXIS.   famotidine  (PEPCID ) 20 MG tablet Take 1 tablet (20 mg total) by mouth 2 (two) times daily as needed for heartburn or indigestion.   levothyroxine  (SYNTHROID ) 75 MCG tablet Take 1 tablet (75 mcg total) by mouth daily.   linaclotide  (LINZESS ) 145 MCG CAPS capsule Take 1 capsule (145 mcg total) by mouth daily before breakfast. Please inform that insurance will not cover Trulance    mirtazapine  (REMERON ) 30 MG tablet Take 1 tablet (30 mg total) by mouth at bedtime.   Facility-Administered Encounter Medications as of 09/07/2024  Medication   cyanocobalamin  ((VITAMIN B-12)) injection 1,000 mcg    Hearing/Vision screen Hearing Screening - Comments:: Pt denies hearing dif Vision Screening - Comments:: Pt wear glasses for wathcing tv/pt goes [facility] in [place]/last ov 1/24 Immunizations and Health Maintenance Health Maintenance  Topic Date Due   Bone Density Scan  Never done   COVID-19 Vaccine (2 - Moderna risk series) 07/04/2025 (Originally 12/16/2019)   Fecal DNA (Cologuard)  02/04/2025   Medicare Annual Wellness (AWV)  09/07/2025   DTaP/Tdap/Td (2 - Td or Tdap) 06/19/2028   Pneumococcal Vaccine: 50+ Years  Completed   Influenza Vaccine  Completed   Hepatitis C Screening  Completed   Zoster Vaccines- Shingrix   Completed   Meningococcal B Vaccine  Aged Out        Assessment/Plan:  This is a routine wellness examination for [facility].  Patient Care Team: Jolinda Norene HERO, DO as PCP - General (Family Medicine) Lavona Agent, MD as PCP - Cardiology (Cardiology) [facility]  I have personally reviewed and noted the following in the patient's chart:   Medical and social history Use of alcohol, tobacco or illicit drugs  Current medications and supplements including opioid prescriptions. Functional ability and status Nutritional status Physical activity Advanced directives List of other physicians Hospitalizations, surgeries, and ER visits in previous 12 months Vitals Screenings to include cognitive, depression, and falls Referrals and appointments  No orders of the defined types were placed in this encounter.  In addition, I have reviewed and discussed with patient certain preventive protocols, quality metrics, and best practice recommendations. A written personalized care plan for preventive services as well as general preventive health recommendations were provided to patient.   Ozie Ned, CMA   09/07/2024   Return in 1 year (on 09/07/2025).  After Visit Summary: (MyChart) Due to this being a telephonic visit, the after visit  summary with patients personalized plan was offered to patient via MyChart   Nurse Notes: pt declined Bone density, don't want to do it.

## 2024-09-15 ENCOUNTER — Ambulatory Visit (INDEPENDENT_AMBULATORY_CARE_PROVIDER_SITE_OTHER)

## 2024-09-15 DIAGNOSIS — E538 Deficiency of other specified B group vitamins: Secondary | ICD-10-CM | POA: Diagnosis not present

## 2024-09-15 NOTE — Progress Notes (Addendum)
 Patient is in office today for a nurse visit for B12 Injection. Patient Injection was given in the  Left deltoid. Patient tolerated injection well. Patient asked for a blood pressure check while in office, blood pressure was 152/72. Repeated blood pressure 5 minutes later, reading was 142/80.

## 2024-09-17 ENCOUNTER — Ambulatory Visit

## 2024-10-15 ENCOUNTER — Ambulatory Visit: Admitting: *Deleted

## 2024-10-15 DIAGNOSIS — E538 Deficiency of other specified B group vitamins: Secondary | ICD-10-CM | POA: Diagnosis not present

## 2024-10-15 NOTE — Progress Notes (Addendum)
 Patient is in office today for a nurse visit for B12 Injection. Patient Injection was given in the  Right deltoid. Patient tolerated injection well.

## 2024-11-12 ENCOUNTER — Encounter: Admitting: Family Medicine

## 2024-11-12 DIAGNOSIS — K581 Irritable bowel syndrome with constipation: Secondary | ICD-10-CM

## 2024-11-12 MED ORDER — TRULANCE 3 MG PO TABS: 1.0000 | ORAL_TABLET | Freq: Every day | ORAL | 3 refills | Status: AC

## 2024-11-12 NOTE — Telephone Encounter (Signed)

## 2024-11-15 ENCOUNTER — Ambulatory Visit

## 2024-11-17 ENCOUNTER — Ambulatory Visit (INDEPENDENT_AMBULATORY_CARE_PROVIDER_SITE_OTHER): Admitting: *Deleted

## 2024-11-17 DIAGNOSIS — E538 Deficiency of other specified B group vitamins: Secondary | ICD-10-CM | POA: Diagnosis not present

## 2024-11-17 NOTE — Progress Notes (Signed)
 Patient is in office today for a nurse visit for B12 Injection. Patient Injection was given in the  Left deltoid. Patient tolerated injection well.

## 2024-12-14 ENCOUNTER — Ambulatory Visit

## 2024-12-20 ENCOUNTER — Encounter: Payer: Self-pay | Admitting: Family Medicine

## 2025-09-12 ENCOUNTER — Ambulatory Visit
